# Patient Record
Sex: Female | Born: 1985
Health system: Southern US, Community
[De-identification: ages and names within clinical notes are randomized; demographics above are authoritative.]

## PROBLEM LIST (undated history)

## (undated) DIAGNOSIS — R079 Chest pain, unspecified: Secondary | ICD-10-CM

## (undated) DIAGNOSIS — I10 Essential (primary) hypertension: Secondary | ICD-10-CM

## (undated) DIAGNOSIS — R7303 Prediabetes: Secondary | ICD-10-CM

## (undated) DIAGNOSIS — Z9889 Other specified postprocedural states: Secondary | ICD-10-CM

## (undated) DIAGNOSIS — J45909 Unspecified asthma, uncomplicated: Secondary | ICD-10-CM

## (undated) DIAGNOSIS — T7840XA Allergy, unspecified, initial encounter: Secondary | ICD-10-CM

## (undated) DIAGNOSIS — R112 Nausea with vomiting, unspecified: Secondary | ICD-10-CM

## (undated) DIAGNOSIS — E042 Nontoxic multinodular goiter: Secondary | ICD-10-CM

## (undated) HISTORY — PX: TONSILLECTOMY AND ADENOIDECTOMY: SUR1326

## (undated) HISTORY — DX: Chest pain, unspecified: R07.9

## (undated) HISTORY — DX: Unspecified asthma, uncomplicated: J45.909

## (undated) HISTORY — PX: DILATION AND CURETTAGE OF UTERUS: SHX78

## (undated) HISTORY — DX: Essential (primary) hypertension: I10

## (undated) HISTORY — DX: Allergy, unspecified, initial encounter: T78.40XA

---

## 2012-10-12 ENCOUNTER — Encounter: Payer: Self-pay | Admitting: Obstetrics

## 2012-10-12 ENCOUNTER — Ambulatory Visit (INDEPENDENT_AMBULATORY_CARE_PROVIDER_SITE_OTHER): Payer: Medicaid Other | Admitting: Obstetrics

## 2012-10-12 VITALS — BP 153/98 | HR 81 | Temp 98.6°F | Ht 65.0 in | Wt 380.0 lb

## 2012-10-12 DIAGNOSIS — N949 Unspecified condition associated with female genital organs and menstrual cycle: Secondary | ICD-10-CM

## 2012-10-12 DIAGNOSIS — N760 Acute vaginitis: Secondary | ICD-10-CM

## 2012-10-12 DIAGNOSIS — Z0000 Encounter for general adult medical examination without abnormal findings: Secondary | ICD-10-CM

## 2012-10-12 DIAGNOSIS — Z01419 Encounter for gynecological examination (general) (routine) without abnormal findings: Secondary | ICD-10-CM

## 2012-10-12 NOTE — Progress Notes (Signed)
Subjective:     Monica Cantu is a 27 y.o. female here for her annual exam.  Current complaints: Patient states that she had some pelvic pain and brownish discharge with an odor for about 4 days.  She noticed yesterday that she had a tampon left inside.  She would just like to make sure she does not have an infection.   Personal health questionnaire reviewed: yes.   Gynecologic History Patient's last menstrual period was 09/28/2012. Contraception: none Last Pap: 2013. Results were: normal  Last mammogram: N/A  Obstetric History OB History   Grav Para Term Preterm Abortions TAB SAB Ect Mult Living                   The following portions of the patient's history were reviewed and updated as appropriate: allergies, current medications, past family history, past medical history, past social history, past surgical history and problem list.  Review of Systems Pertinent items are noted in HPI.    Objective:    General appearance: alert, no distress and morbidly obese Breasts: normal appearance, no masses or tenderness Abdomen: normal findings: soft, non-tender Pelvic: cervix normal in appearance, external genitalia normal, no adnexal masses or tenderness, no cervical motion tenderness, uterus normal size, shape, and consistency and vagina normal without discharge    Assessment:    Healthy female exam.   Pelvic pain.   Plan:    Education reviewed: Pelvic pain. Follow up in: 2 weeks. Ultrasound ordered.

## 2012-10-13 LAB — WET PREP BY MOLECULAR PROBE
Candida species: NEGATIVE
Gardnerella vaginalis: POSITIVE — AB
Trichomonas vaginosis: NEGATIVE

## 2012-10-13 LAB — PAP IG W/ RFLX HPV ASCU

## 2012-10-19 ENCOUNTER — Other Ambulatory Visit: Payer: Self-pay | Admitting: *Deleted

## 2012-10-19 DIAGNOSIS — B9689 Other specified bacterial agents as the cause of diseases classified elsewhere: Secondary | ICD-10-CM

## 2012-10-19 MED ORDER — METRONIDAZOLE 500 MG PO TABS: 500.0000 mg | ORAL_TABLET | Freq: Two times a day (BID) | ORAL | Status: DC

## 2012-10-26 ENCOUNTER — Ambulatory Visit: Payer: Medicaid Other | Admitting: Obstetrics

## 2013-04-25 ENCOUNTER — Emergency Department (HOSPITAL_BASED_OUTPATIENT_CLINIC_OR_DEPARTMENT_OTHER)
Admission: EM | Admit: 2013-04-25 | Discharge: 2013-04-25 | Disposition: A | Discharge status: Home / Self Care | Payer: Medicaid Other | Attending: Emergency Medicine | Admitting: Emergency Medicine

## 2013-04-25 ENCOUNTER — Emergency Department (HOSPITAL_BASED_OUTPATIENT_CLINIC_OR_DEPARTMENT_OTHER): Payer: Medicaid Other

## 2013-04-25 ENCOUNTER — Encounter (HOSPITAL_BASED_OUTPATIENT_CLINIC_OR_DEPARTMENT_OTHER): Payer: Self-pay | Admitting: Emergency Medicine

## 2013-04-25 DIAGNOSIS — R0789 Other chest pain: Secondary | ICD-10-CM | POA: Insufficient documentation

## 2013-04-25 DIAGNOSIS — R079 Chest pain, unspecified: Secondary | ICD-10-CM

## 2013-04-25 DIAGNOSIS — J45909 Unspecified asthma, uncomplicated: Secondary | ICD-10-CM | POA: Insufficient documentation

## 2013-04-25 DIAGNOSIS — Z79899 Other long term (current) drug therapy: Secondary | ICD-10-CM | POA: Insufficient documentation

## 2013-04-25 LAB — CBC
HCT: 38.1 % (ref 36.0–46.0)
Hemoglobin: 12.4 g/dL (ref 12.0–15.0)
MCH: 27.3 pg (ref 26.0–34.0)
MCHC: 32.5 g/dL (ref 30.0–36.0)
MCV: 83.7 fL (ref 78.0–100.0)
PLATELETS: 186 10*3/uL (ref 150–400)
RBC: 4.55 MIL/uL (ref 3.87–5.11)
RDW: 15.6 % — ABNORMAL HIGH (ref 11.5–15.5)
WBC: 8.5 10*3/uL (ref 4.0–10.5)

## 2013-04-25 LAB — BASIC METABOLIC PANEL
BUN: 11 mg/dL (ref 6–23)
CALCIUM: 9.4 mg/dL (ref 8.4–10.5)
CO2: 23 mEq/L (ref 19–32)
CREATININE: 0.8 mg/dL (ref 0.50–1.10)
Chloride: 103 mEq/L (ref 96–112)
GFR calc Af Amer: >90 mL/min (ref >90)
GLUCOSE: 97 mg/dL (ref 70–99)
Potassium: 4 mEq/L (ref 3.7–5.3)
Sodium: 140 mEq/L (ref 137–147)

## 2013-04-25 LAB — TROPONIN I: Troponin I: <0.3 ng/mL (ref <0.30)

## 2013-04-25 MED ORDER — HYDROCODONE-ACETAMINOPHEN 5-325 MG PO TABS
1.0000 | ORAL_TABLET | Freq: Once | ORAL | Status: AC
  Administered 2013-04-25: 1 via ORAL
  Filled 2013-04-25: qty 1

## 2013-04-25 MED ORDER — HYDROCODONE-ACETAMINOPHEN 5-325 MG PO TABS: 1.0000 | ORAL_TABLET | ORAL | Status: DC | PRN

## 2013-04-25 NOTE — ED Notes (Signed)
Pt returned from radiology.

## 2013-04-25 NOTE — ED Provider Notes (Signed)
CSN: [id number]     Arrival date & time 04/25/13  0935 History   First MD Initiated Contact with Patient 04/25/13 [phone]     Chief Complaint  Patient presents with  . Chest Pain   (Consider location/radiation/quality/duration/timing/severity/associated sxs/prior Treatment) HPI Comments: Pt is c/o pain on the right chest that started yesterday. Pain is intermittent:worse with laying back;tried her inhaler without relief. No fever, cough, injury or sob. No n/v or sob:pt states that pain goes from the right chest to the center or her chest. No history of similar symptoms  The history is provided by the patient. No language interpreter was used.    Past Medical History  Diagnosis Date  . Allergy   . Asthma    Past Surgical History  Procedure Laterality Date  . D 7 c    . Tonsillectomy and adenoidectomy     Family History  Problem Relation Age of Onset  . Hyperlipidemia Father   . Diabetes Maternal Grandmother   . Heart disease Paternal Grandmother   . Hypertension Paternal Grandmother    History  Substance Use Topics  . Smoking status: Never Smoker   . Smokeless tobacco: Not on file  . Alcohol Use: No   OB History   Grav Para Term Preterm Abortions TAB SAB Ect Mult Living                 Review of Systems  Constitutional: Negative.   Respiratory: Negative for shortness of breath.   Cardiovascular: Positive for chest pain.    Allergies  Review of patient's allergies indicates no known allergies.  Home Medications   Current Outpatient Rx  Name  Route  Sig  Dispense  Refill  . albuterol (PROVENTIL) (2.5 MG/3ML) 0.083% nebulizer solution   Nebulization   Take 2.5 mg by nebulization every 6 (six) hours as needed for wheezing.         . cetirizine (ZYRTEC) 10 MG tablet   Oral   Take 10 mg by mouth daily.         . metroNIDAZOLE (FLAGYL) 500 MG tablet   Oral   Take 1 tablet (500 mg total) by mouth 2 (two) times daily.   14 tablet   0    BP 165/100  Pulse  88  Temp(Src) 98.7 F (37.1 C) (Oral)  Resp 20  Ht 5\' 5"  (1.651 m)  Wt 411 lb (186.428 kg)  BMI 68.39 kg/m2  SpO2 100%  LMP 04/14/2013 Physical Exam  Vitals reviewed. Constitutional: She is oriented to person, place, and time. She appears well-developed and well-nourished.  HENT:  Head: Normocephalic and atraumatic.  Cardiovascular: Normal rate and regular rhythm.   Pulmonary/Chest: Effort normal and breath sounds normal. She exhibits tenderness.  Abdominal: Soft. There is no tenderness.  Musculoskeletal: Normal range of motion.  Neurological: She is alert and oriented to person, place, and time. Coordination normal.  Skin: Skin is warm and dry.    ED Course  Procedures (including critical care time) Labs Review Labs Reviewed  CBC - Abnormal; Notable for the following:    RDW 15.6 (*)    All other components within normal limits  BASIC METABOLIC PANEL  TROPONIN I   Imaging Review Dg Chest 2 View  04/25/2013   CLINICAL DATA:  Chest pain  EXAM: CHEST  2 VIEW  COMPARISON:  None.  FINDINGS: The heart size and mediastinal contours are within normal limits. There is no focal infiltrate, pulmonary edema, or pleural effusion. The visualized skeletal  structures are unremarkable.  IMPRESSION: No active cardiopulmonary disease.   Electronically Signed   By: Sherian Rein M.D.   On: 04/25/2013 10:57    EKG Interpretation    Date/Time:  Monday April 25 2013 09:53:16 EST Ventricular Rate:  73 PR Interval:  154 QRS Duration: 96 QT Interval:  372 QTC Calculation: 409 R Axis:   62 Text Interpretation:  Normal sinus rhythm Normal ECG No old tracing to compare Confirmed by JACUBOWITZ  MD, SAM ([id number]) on 04/25/2013 9:56:52 AM            MDM   1. Chest pain    Pt pain is better after the hydrocodone:considered pe although she is not hypoxic or tachycardic:pt appears pleuritic in nature:pt to follow up with her pcp for continued symptoms   Teressa Lower, NP 04/25/13  1145

## 2013-04-25 NOTE — ED Notes (Signed)
NP at bedside.

## 2013-04-25 NOTE — ED Notes (Signed)
Pt reports she developed right chest pain last night with radiation to central chest.  She also reports low back pain.

## 2013-04-25 NOTE — Discharge Instructions (Signed)
Chest Pain (Nonspecific) °It is often hard to give a specific diagnosis for the cause of chest pain. There is always a chance that your pain could be related to something serious, such as a heart attack or a blood clot in the lungs. You need to follow up with your caregiver for further evaluation. °CAUSES  °· Heartburn. °· Pneumonia or bronchitis. °· Anxiety or stress. °· Inflammation around your heart (pericarditis) or lung (pleuritis or pleurisy). °· A blood clot in the lung. °· A collapsed lung (pneumothorax). It can develop suddenly on its own (spontaneous pneumothorax) or from injury (trauma) to the chest. °· Shingles infection (herpes zoster virus). °The chest wall is composed of bones, muscles, and cartilage. Any of these can be the source of the pain. °· The bones can be bruised by injury. °· The muscles or cartilage can be strained by coughing or overwork. °· The cartilage can be affected by inflammation and become sore (costochondritis). °DIAGNOSIS  °Lab tests or other studies, such as X-rays, electrocardiography, stress testing, or cardiac imaging, may be needed to find the cause of your pain.  °TREATMENT  °· Treatment depends on what may be causing your chest pain. Treatment may include: °· Acid blockers for heartburn. °· Anti-inflammatory medicine. °· Pain medicine for inflammatory conditions. °· Antibiotics if an infection is present. °· You may be advised to change lifestyle habits. This includes stopping smoking and avoiding alcohol, caffeine, and chocolate. °· You may be advised to keep your head raised (elevated) when sleeping. This reduces the chance of acid going backward from your stomach into your esophagus. °· Most of the time, nonspecific chest pain will improve within 2 to 3 days with rest and mild pain medicine. °HOME CARE INSTRUCTIONS  °· If antibiotics were prescribed, take your antibiotics as directed. Finish them even if you start to feel better. °· For the next few days, avoid physical  activities that bring on chest pain. Continue physical activities as directed. °· Do not smoke. °· Avoid drinking alcohol. °· Only take over-the-counter or prescription medicine for pain, discomfort, or fever as directed by your caregiver. °· Follow your caregiver's suggestions for further testing if your chest pain does not go away. °· Keep any follow-up appointments you made. If you do not go to an appointment, you could develop lasting (chronic) problems with pain. If there is any problem keeping an appointment, you must call to reschedule. °SEEK MEDICAL CARE IF:  °· You think you are having problems from the medicine you are taking. Read your medicine instructions carefully. °· Your chest pain does not go away, even after treatment. °· You develop a rash with blisters on your chest. °SEEK IMMEDIATE MEDICAL CARE IF:  °· You have increased chest pain or pain that spreads to your arm, neck, jaw, back, or abdomen. °· You develop shortness of breath, an increasing cough, or you are coughing up blood. °· You have severe back or abdominal pain, feel nauseous, or vomit. °· You develop severe weakness, fainting, or chills. °· You have a fever. °THIS IS AN EMERGENCY. Do not wait to see if the pain will go away. Get medical help at once. Call your local emergency services (911 in [place] Do not drive yourself to the hospital. °MAKE SURE YOU:  °· Understand these instructions. °· Will watch your condition. °· Will get help right away if you are not doing well or get worse. °Document Released: 01/01/2005 Document Revised: 06/16/2011 Document Reviewed: 10/28/2007 °[employer]® Patient Information ©2014 [employer]   [employer]. ° °

## 2013-04-25 NOTE — ED Provider Notes (Signed)
Medical screening examination/treatment/procedure(s) were performed by non-physician practitioner and as supervising physician I was immediately available for consultation/collaboration.  EKG Interpretation    Date/Time:  Monday April 25 2013 09:53:16 EST Ventricular Rate:  73 PR Interval:  154 QRS Duration: 96 QT Interval:  372 QTC Calculation: 409 R Axis:   62 Text Interpretation:  Normal sinus rhythm Normal ECG No old tracing to compare Confirmed by Ethelda ChickJACUBOWITZ  MD, Zariana Strub ([id number]) on 04/25/2013 9:56:52 AM             Doug SouSam Lashundra Shiveley, MD 04/25/13 1414

## 2013-06-16 ENCOUNTER — Encounter: Payer: Self-pay | Admitting: Obstetrics

## 2013-06-16 ENCOUNTER — Ambulatory Visit (INDEPENDENT_AMBULATORY_CARE_PROVIDER_SITE_OTHER): Payer: Medicaid Other | Admitting: Obstetrics

## 2013-06-16 VITALS — BP 153/83 | HR 87 | Temp 98.6°F | Ht 64.0 in | Wt >= 6400 oz

## 2013-06-16 DIAGNOSIS — N926 Irregular menstruation, unspecified: Secondary | ICD-10-CM

## 2013-06-16 DIAGNOSIS — N925 Other specified irregular menstruation: Secondary | ICD-10-CM

## 2013-06-16 DIAGNOSIS — N938 Other specified abnormal uterine and vaginal bleeding: Secondary | ICD-10-CM

## 2013-06-16 DIAGNOSIS — N949 Unspecified condition associated with female genital organs and menstrual cycle: Secondary | ICD-10-CM

## 2013-06-16 LAB — POCT URINE PREGNANCY: Preg Test, Ur: NEGATIVE

## 2013-06-16 MED ORDER — IBUPROFEN 800 MG PO TABS: 800.0000 mg | ORAL_TABLET | Freq: Three times a day (TID) | ORAL | Status: DC | PRN

## 2013-06-16 NOTE — Progress Notes (Signed)
Subjective:     Monica Cantu is a 28 y.o. female here for a routine exam.  Current complaints: Patient is in the office today for a Problem visit. Patient states she is having pain in her pelvic are and on her left side. Patient states she has been nauseous for the last 2 weeks. Patient states she is having lower back pain. Patient states yesterday she had some light pink spotting but nothing since. Patient states she is late for her cycle. Patient states she has taken 3 pregnancy test 2 were negative and 1 was positive. Patient states she has been have bad headaches for a week or so.  Personal health questionnaire reviewed: yes.   Gynecologic History Patient's last menstrual period was 05/09/2013. Contraception: condoms Last Pap: 10/12/2012. Results were: normal  Obstetric History OB History  Gravida Para Term Preterm AB SAB TAB Ectopic Multiple Living  4 2 1 1 2 2    2     # Outcome Date GA Lbr Len/2nd Weight Sex Delivery Anes PTL Lv  4 TRM 08/05/10 [id number]  9 lb 1 oz (4.111 kg) F SVD EPI  Y  3 SAB 2011        N  2 SAB 2008        N  1 PRE 06/22/04 [id number]  7 lb 11 oz (3.487 kg) M SVD EPI  Y       The following portions of the patient's history were reviewed and updated as appropriate: allergies, current medications, past family history, past medical history, past social history, past surgical history and problem list.  Review of Systems Pertinent items are noted in HPI.    Objective:    General appearance: alert and no distress Abdomen: abnormal findings:  mild tenderness in the LUQ and in the LLQ Pelvic: cervix normal in appearance, external genitalia normal, no adnexal masses or tenderness, no cervical motion tenderness, rectovaginal septum normal, uterus normal size, shape, and consistency, vagina normal without discharge and menstrual blood in vault and from cervix    Assessment:    Pelvic pain.  Late period.  ( H/O ectopic and SAB, so she is fearful of reccurrence )   HTN Plan:    Education reviewed: Pelvic pain.  . Contraception: condoms. Follow up in: 2 weeks. Ultrasound ordered.

## 2013-06-16 NOTE — Addendum Note (Signed)
Addended by: Odessa FlemingBOHNE, Shaquaya Wuellner M on: 06/16/2013 03:19 PM   Modules accepted: Orders

## 2013-06-17 LAB — GC/CHLAMYDIA PROBE AMP
CT Probe RNA: NEGATIVE
GC PROBE AMP APTIMA: NEGATIVE

## 2013-06-17 LAB — WET PREP BY MOLECULAR PROBE
Candida species: NEGATIVE
Gardnerella vaginalis: NEGATIVE
Trichomonas vaginosis: NEGATIVE

## 2013-07-01 ENCOUNTER — Ambulatory Visit (HOSPITAL_COMMUNITY): Payer: Medicaid Other

## 2013-07-05 ENCOUNTER — Ambulatory Visit: Payer: Medicaid Other | Admitting: Obstetrics

## 2013-07-07 ENCOUNTER — Ambulatory Visit (HOSPITAL_COMMUNITY): Admission: RE | Admit: 2013-07-07 | Payer: Medicaid Other | Source: Ambulatory Visit

## 2013-07-18 ENCOUNTER — Ambulatory Visit: Payer: Medicaid Other | Admitting: Obstetrics

## 2014-02-06 ENCOUNTER — Encounter: Payer: Self-pay | Admitting: Obstetrics

## 2015-01-30 ENCOUNTER — Emergency Department (HOSPITAL_BASED_OUTPATIENT_CLINIC_OR_DEPARTMENT_OTHER): Payer: Managed Care, Other (non HMO)

## 2015-01-30 ENCOUNTER — Emergency Department (HOSPITAL_BASED_OUTPATIENT_CLINIC_OR_DEPARTMENT_OTHER)
Admission: EM | Admit: 2015-01-30 | Discharge: 2015-01-30 | Disposition: A | Discharge status: Home / Self Care | Payer: Managed Care, Other (non HMO) | Attending: Emergency Medicine | Admitting: Emergency Medicine

## 2015-01-30 ENCOUNTER — Telehealth: Payer: Self-pay | Admitting: Internal Medicine

## 2015-01-30 ENCOUNTER — Encounter (HOSPITAL_BASED_OUTPATIENT_CLINIC_OR_DEPARTMENT_OTHER): Payer: Self-pay | Admitting: *Deleted

## 2015-01-30 DIAGNOSIS — Z79899 Other long term (current) drug therapy: Secondary | ICD-10-CM | POA: Insufficient documentation

## 2015-01-30 DIAGNOSIS — J45901 Unspecified asthma with (acute) exacerbation: Secondary | ICD-10-CM | POA: Insufficient documentation

## 2015-01-30 DIAGNOSIS — R079 Chest pain, unspecified: Secondary | ICD-10-CM | POA: Diagnosis present

## 2015-01-30 MED ORDER — PREDNISONE 20 MG PO TABS: 40.0000 mg | ORAL_TABLET | Freq: Once | ORAL | Status: DC

## 2015-01-30 MED ORDER — PREDNISONE 20 MG PO TABS
40.0000 mg | ORAL_TABLET | Freq: Once | ORAL | Status: AC
  Administered 2015-01-30: 40 mg via ORAL
  Filled 2015-01-30: qty 2

## 2015-01-30 MED ORDER — ALBUTEROL SULFATE HFA 108 (90 BASE) MCG/ACT IN AERS: 1.0000 | INHALATION_SPRAY | RESPIRATORY_TRACT | Status: DC | PRN

## 2015-01-30 MED ORDER — KETOROLAC TROMETHAMINE 60 MG/2ML IM SOLN
60.0000 mg | Freq: Once | INTRAMUSCULAR | Status: AC
  Administered 2015-01-30: 60 mg via INTRAMUSCULAR
  Filled 2015-01-30: qty 2

## 2015-01-30 MED ORDER — IPRATROPIUM-ALBUTEROL 0.5-2.5 (3) MG/3ML IN SOLN
3.0000 mL | Freq: Once | RESPIRATORY_TRACT | Status: AC
  Administered 2015-01-30: 3 mL via RESPIRATORY_TRACT
  Filled 2015-01-30: qty 3

## 2015-01-30 NOTE — ED Provider Notes (Signed)
CSN: [id number]     Arrival date & time 01/30/15  2044 History  By signing my name below, I, Gwenyth Ober, attest that this documentation has been prepared under the direction and in the presence of Marily Memos, MD.  Electronically Signed: Gwenyth Ober, ED Scribe. 01/30/2015. 9:34 PM.   Chief Complaint  Patient presents with  . Chest Pain   The history is provided by the patient. No language interpreter was used.   HPI Comments: Monica Cantu is a 29 y.o. female with a history of asthma who presents to the Emergency Department complaining of intermittent, moderate chest pressure that started yesterday and became more constant today. She states congestion, cough and SOB that started a few days ago as associated symptoms. Pt has tried using her inhaler with no relief. Pt has a history of similar symptoms associated with asthma exacerbations. She denies a history of CAD, recent surgeries or long travel. Pt also denies rash, leg swelling, nausea, vomiting and diaphoresis as associated symptoms.  Past Medical History  Diagnosis Date  . Allergy   . Asthma    Past Surgical History  Procedure Laterality Date  . D 7 c    . Tonsillectomy and adenoidectomy     Family History  Problem Relation Age of Onset  . Hyperlipidemia Father   . Diabetes Maternal Grandmother   . Heart disease Paternal Grandmother   . Hypertension Paternal Grandmother    Social History  Substance Use Topics  . Smoking status: Never Smoker   . Smokeless tobacco: Never Used  . Alcohol Use: No   OB History    Gravida Para Term Preterm AB TAB SAB Ectopic Multiple Living   Review of Systems  Constitutional: Negative for diaphoresis.  HENT: Positive for congestion.   Respiratory: Positive for cough and shortness of breath.   Cardiovascular: Positive for chest pain. Negative for leg swelling.  Gastrointestinal: Negative for nausea and vomiting.  Skin: Negative for rash.  All other systems  reviewed and are negative.  Allergies  Review of patient's allergies indicates no known allergies.  Home Medications   Prior to Admission medications   Medication Sig Start Date End Date Taking? Authorizing Provider  albuterol (PROVENTIL HFA;VENTOLIN HFA) 108 (90 BASE) MCG/ACT inhaler Inhale 1-2 puffs into the lungs every 4 (four) hours as needed for wheezing or shortness of breath. 01/30/15   Marily Memos, MD  Beclomethasone Dipropionate (QVAR IN) Inhale into the lungs.    Historical Provider, MD  cetirizine (ZYRTEC) 10 MG tablet Take 10 mg by mouth daily.    Historical Provider, MD  ibuprofen (ADVIL,MOTRIN) 800 MG tablet Take 1 tablet (800 mg total) by mouth every 8 (eight) hours as needed. 06/16/13   Brock Bad, MD  predniSONE (DELTASONE) 20 MG tablet Take 2 tablets (40 mg total) by mouth once. 01/31/15   Barbara Cower Rachelle Edwards, MD   BP 134/78 mmHg  Pulse 98  Temp(Src) 99 F (37.2 C) (Oral)  Resp 20  Ht  (1.626 m)  Wt 418 lb (189.604 kg)  BMI 71.71 kg/m2  SpO2 100%  LMP 01/08/2015 Physical Exam  Constitutional: She is oriented to person, place, and time. She appears well-developed and well-nourished. No distress.  HENT:  Head: Normocephalic and atraumatic.  Eyes: Conjunctivae and EOM are normal. Pupils are equal, round, and reactive to light.  Neck: Normal range of motion. Neck supple. No tracheal deviation present.  Cardiovascular: Normal  rate, regular rhythm and normal heart sounds.   Pulmonary/Chest: Effort normal. No respiratory distress. She has wheezes.  Lungs mostly clear with mild expiratory wheeze  Abdominal: Soft. There is no tenderness.  Musculoskeletal: She exhibits no edema.  Neurological: She is alert and oriented to person, place, and time.  Skin: Skin is warm and dry.  Psychiatric: She has a normal mood and affect. Her behavior is normal.  Nursing note and vitals reviewed.   ED Course  Procedures  DIAGNOSTIC STUDIES: Oxygen Saturation is 100% on RA,  normal by my interpretation.    COORDINATION OF CARE: 9:32 PM Discussed treatment plan with pt at bedside and pt agreed to plan.  Labs Review Labs Reviewed - No data to display  Imaging Review Dg Chest 2 View  01/30/2015  CLINICAL DATA:  Intermittent left chest pressure. EXAM: CHEST  2 VIEW COMPARISON:  04/25/2013 FINDINGS: The heart size and mediastinal contours are within normal limits. Both lungs are clear. The visualized skeletal structures are unremarkable. IMPRESSION: No active cardiopulmonary disease. Electronically Signed   By: Richarda OverlieAdam  Henn M.D.   On: 01/30/2015 21:50      EKG Interpretation   Date/Time:  Tuesday January 30 2015 20:51:41 EDT Ventricular Rate:  89 PR Interval:  170 QRS Duration: 94 QT Interval:  346 QTC Calculation: 420 R Axis:   60 Text Interpretation:  Normal sinus rhythm Normal ECG Confirmed by Magie Ciampa  MD, Barbara CowerJASON [id number]) on 01/30/2015 9:32:08 PM      MDM   Final diagnoses:  Asthma exacerbation   Has had progressively worsening cough for the last few days with progressively worsening right-sided sharp chest pain as well. She also has some pain in her back. Worse when she takes deep breaths and worse and she coughs. This is similar to multiple previous episodes of asthma exacerbations. No fever no productive cough no leg swelling, recent travels, arm swelling or history of clots. Exam benign with some mild end expiratory wheezing after DuoNeb she received prior to my evaluation. PERC negative, doubt PE. Chest x-ray without evidence of rib fracture, pneumonia or pneumothorax. Symptoms seem to be most consistent with a muscular pain secondary to coughing from an asthma exacerbation and I will treat accordingly.  I have personally and contemperaneously reviewed labs and imaging and used in my decision making as above.   A medical screening exam was performed and I feel the patient has had an appropriate workup for their chief complaint at this time and  likelihood of emergent condition existing is low. They have been counseled on decision, discharge, follow up and which symptoms necessitate immediate return to the emergency department. They or their family verbally stated understanding and agreement with plan and discharged in stable condition.    I personally performed the services described in this documentation, which was scribed in my presence. The recorded information has been reviewed and is accurate.    Marily MemosJason Nellie Chevalier, MD 01/30/15 [phone]

## 2015-01-30 NOTE — Telephone Encounter (Signed)
FYI

## 2015-01-30 NOTE — ED Notes (Signed)
Patient verbalizes understanding of discharge instructions and medications.  Patient stable and ambulatory. 

## 2015-01-30 NOTE — Telephone Encounter (Signed)
Please advise 

## 2015-01-30 NOTE — Telephone Encounter (Signed)
That is okay, please arrange. Please be sure the patient knows that she will continue need to see her gynecologist

## 2015-01-30 NOTE — ED Notes (Signed)
Chest pressure in the center of her chest since this am. Cold with cough for a couple of days. She feels sob. Hx of asthma and has been using her inhalers all weekend.

## 2015-01-30 NOTE — Telephone Encounter (Signed)
Ms. Monica Cantu is requesting to see Dr. Drue NovelPaz. She states she now has [employer] insurance and was referred by existing pt [name]. Please advise.

## 2015-01-31 NOTE — Telephone Encounter (Signed)
Left msg for pt to call and schedule new pt and to continue with her gyn.

## 2015-04-17 ENCOUNTER — Ambulatory Visit: Payer: Self-pay | Admitting: Certified Nurse Midwife

## 2015-04-24 ENCOUNTER — Encounter: Payer: Self-pay | Admitting: Certified Nurse Midwife

## 2015-04-24 ENCOUNTER — Ambulatory Visit (INDEPENDENT_AMBULATORY_CARE_PROVIDER_SITE_OTHER): Payer: BLUE CROSS/BLUE SHIELD | Admitting: Certified Nurse Midwife

## 2015-04-24 VITALS — BP 152/103 | HR 79 | Temp 98.3°F | Wt 378.0 lb

## 2015-04-24 DIAGNOSIS — R1084 Generalized abdominal pain: Secondary | ICD-10-CM

## 2015-04-24 DIAGNOSIS — Z01419 Encounter for gynecological examination (general) (routine) without abnormal findings: Secondary | ICD-10-CM

## 2015-04-24 DIAGNOSIS — N939 Abnormal uterine and vaginal bleeding, unspecified: Secondary | ICD-10-CM

## 2015-04-24 DIAGNOSIS — R030 Elevated blood-pressure reading, without diagnosis of hypertension: Secondary | ICD-10-CM

## 2015-04-24 DIAGNOSIS — M5440 Lumbago with sciatica, unspecified side: Secondary | ICD-10-CM

## 2015-04-24 DIAGNOSIS — IMO0001 Reserved for inherently not codable concepts without codable children: Secondary | ICD-10-CM | POA: Insufficient documentation

## 2015-04-24 MED ORDER — AMLODIPINE-VALSARTAN-HCTZ 10-160-25 MG PO TABS: 1.0000 | ORAL_TABLET | Freq: Every morning | ORAL | Status: DC

## 2015-04-24 MED ORDER — IBUPROFEN 800 MG PO TABS: 800.0000 mg | ORAL_TABLET | Freq: Three times a day (TID) | ORAL | Status: DC | PRN

## 2015-04-24 NOTE — Progress Notes (Signed)
Patient ID: Monica Cantu, female   DOB: Nov 20, 1985, 30 y.o.   MRN: [id number]    Subjective:      Monica Cantu is a 30 y.o. female here for a routine exam.  Current complaints: heavy and painful periods.  Regular periods, lasting 3 days, with first two days are heavy with clots about half dollar size.  Hx of D&C.  Does not desire to be pregnant currently.  Not currently sexually active, divorced.   Works full time.  Has low back pain, usually on the left side with sciatic pains on the left side, has not tried anything for the pain.  Has had the pain for years, since before her children.  Has been trying to loose weight, recently tried bHCG hormonal treatment, is interested in gastric bypass surgery.    Does report lower abdominal pain that comes and goes on the right side, denies any diarrhea and reports constipation.     Personal health questionnaire:  Is patient [name], have a family history of breast and/or ovarian cancer: no Is there a family history of uterine cancer diagnosed at age < 9, gastrointestinal cancer, urinary tract cancer, family member who is a Personnel officer syndrome-associated carrier: no Is the patient overweight and hypertensive, family history of diabetes, personal history of gestational diabetes, preeclampsia or PCOS: yes Is patient over 7, have PCOS,  family history of premature CHD under age 29, diabetes, smoke, have hypertension or peripheral artery disease:  yes At any time, has a partner hit, kicked or otherwise hurt or frightened you?: no Over the past 2 weeks, have you felt down, depressed or hopeless?: no Over the past 2 weeks, have you felt little interest or pleasure in doing things?:no   Gynecologic History Patient's last menstrual period was 04/02/2015. Contraception: abstinence Last Pap: 10/2012. Results were: normal Last mammogram: N/A.   Obstetric History OB History  Gravida Para Term Preterm AB SAB TAB Ectopic Multiple Living  # Outcome Date GA Lbr Len/2nd Weight Sex Delivery Anes PTL Lv  5 Term 08/05/10 [redacted]w[redacted]d  9 lb 1 oz (4.111 kg) F Vag-Spont EPI  Y  4 SAB 2011        N  3 SAB 2008        N  2 Preterm 06/22/04 [redacted]w[redacted]d  7 lb 11 oz (3.487 kg) M Vag-Spont EPI  Y  1 Ectopic 2004              Past Medical History  Diagnosis Date  . Allergy   . Asthma     Past Surgical History  Procedure Laterality Date  . D 7 c    . Tonsillectomy and adenoidectomy       Current outpatient prescriptions:  .  albuterol (PROVENTIL HFA;VENTOLIN HFA) 108 (90 BASE) MCG/ACT inhaler, Inhale 1-2 puffs into the lungs every 4 (four) hours as needed for wheezing or shortness of breath., Disp: 1 Inhaler, Rfl: 0 .  cetirizine (ZYRTEC) 10 MG tablet, Take 10 mg by mouth daily., Disp: , Rfl:  .  Beclomethasone Dipropionate (QVAR IN), Inhale into the lungs. Reported on 04/24/2015, Disp: , Rfl:  No Known Allergies  Social History  Substance Use Topics  . Smoking status: Never Smoker   . Smokeless tobacco: Never Used  . Alcohol Use: No    Family History  Problem Relation Age of Onset  . Hyperlipidemia Father   . Diabetes Maternal Grandmother   .  Heart disease Paternal Grandmother   . Hypertension Paternal Grandmother       Review of Systems  Constitutional: negative for fatigue and weight loss, +obesity Respiratory: negative for cough and wheezing Cardiovascular: negative for chest pain, fatigue and palpitations Gastrointestinal: + for abdominal pain and change in bowel habits Musculoskeletal:+ for myalgias Neurological: negative for gait problems and tremors Behavioral/Psych: negative for abusive relationship, depression Endocrine: negative for temperature intolerance   Genitourinary:negative for genital lesions, hot flashes, sexual problems and vaginal discharge, + abnormal menstrual periods Integument/breast: negative for breast lump, breast tenderness, nipple discharge and skin lesion(s)    Objective:       BP  152/103 mmHg  Pulse 79  Temp(Src) 98.3 F (36.8 C)  Wt 378 lb (171.46 kg)  LMP 04/02/2015 General:   alert  Skin:   no rash or abnormalities  Lungs:   clear to auscultation bilaterally  Heart:   regular rate and rhythm, S1, S2 normal, no murmur, click, rub or gallop  Breasts:   normal without suspicious masses, skin or nipple changes or axillary nodes  Abdomen:  normal findings: no organomegaly, soft, non-tender and no hernia obese  Pelvis:  External genitalia: normal general appearance Urinary system: urethral meatus normal and bladder without fullness, nontender Vaginal: normal without tenderness, induration or masses Cervix: normal appearance, friable on exam Adnexa: normal bimanual exam Uterus: anteverted and non-tender, normal size, difficult to assess d/t morbid obesity   Lab Review Urine pregnancy test Labs reviewed yes Radiologic studies reviewed no  60% of 45 min visit spent on counseling and coordination of care.   Assessment:    Healthy female exam.    Morbid obesity  Lumbar back pain with sciatica  Lower abdominal pain of unknown origin  Elevated blood pressure: started on meds today     Plan:    Education reviewed: calcium supplements, depression evaluation, low fat, low cholesterol diet, safe sex/STD prevention, self breast exams, skin cancer screening and weight bearing exercise. Contraception: abstinence. Follow up in: 1 month. after [place]  No orders of the defined types were placed in this encounter.   No orders of the defined types were placed in this encounter.    Possible management options include: LARK

## 2015-04-25 ENCOUNTER — Encounter: Payer: Self-pay | Admitting: Gastroenterology

## 2015-04-25 ENCOUNTER — Telehealth: Payer: Self-pay

## 2015-04-25 NOTE — Telephone Encounter (Signed)
CALLED PATIENT WITH U/S APPT AT [facility], GAVE HER NUMBER FOR [facility] AT [place], [phone], [facility] WILL CALL HER DIRECTLY - OTHER APPTS MADE

## 2015-04-26 LAB — PAP, TP IMAGING W/ HPV RNA, RFLX HPV TYPE 16,18/45: HPV mRNA, High Risk: NOT DETECTED

## 2015-04-28 LAB — SURESWAB BACTERIAL VAGINOSIS/ITIS
ATOPOBIUM VAGINAE: NOT DETECTED Log (cells/mL)
C. albicans, DNA: NOT DETECTED
C. glabrata, DNA: NOT DETECTED
C. parapsilosis, DNA: NOT DETECTED
C. tropicalis, DNA: NOT DETECTED
GARDNERELLA VAGINALIS: NOT DETECTED Log (cells/mL)
LACTOBACILLUS SPECIES: NOT DETECTED Log (cells/mL)
MEGASPHAERA SPECIES: NOT DETECTED Log (cells/mL)
T. vaginalis RNA, QL TMA: NOT DETECTED

## 2015-05-01 ENCOUNTER — Telehealth: Payer: Self-pay

## 2015-05-01 NOTE — Telephone Encounter (Signed)
PATIENT REFERRED TO  NEUROSURGERY, AND SHE TOLD THEM WHEN THEY CALLED FOR APPT., THAT SHE WOULD CALL THEM BACK FOR APPT AT A LATER DATE

## 2015-05-03 ENCOUNTER — Ambulatory Visit (HOSPITAL_COMMUNITY): Payer: BLUE CROSS/BLUE SHIELD

## 2015-05-10 ENCOUNTER — Telehealth: Payer: Self-pay | Admitting: Behavioral Health

## 2015-05-10 NOTE — Telephone Encounter (Signed)
Unable to reach patient at time of Pre-Visit Call.  Left message for patient to return call when available.    

## 2015-05-11 ENCOUNTER — Ambulatory Visit (INDEPENDENT_AMBULATORY_CARE_PROVIDER_SITE_OTHER): Payer: BLUE CROSS/BLUE SHIELD | Admitting: Internal Medicine

## 2015-05-11 ENCOUNTER — Encounter: Payer: Self-pay | Admitting: Internal Medicine

## 2015-05-11 ENCOUNTER — Other Ambulatory Visit: Payer: Self-pay | Admitting: Internal Medicine

## 2015-05-11 VITALS — BP 126/74 | HR 81 | Temp 98.6°F | Ht 65.0 in | Wt 375.0 lb

## 2015-05-11 DIAGNOSIS — Z7189 Other specified counseling: Secondary | ICD-10-CM | POA: Diagnosis not present

## 2015-05-11 DIAGNOSIS — R110 Nausea: Secondary | ICD-10-CM

## 2015-05-11 DIAGNOSIS — R109 Unspecified abdominal pain: Secondary | ICD-10-CM

## 2015-05-11 DIAGNOSIS — R030 Elevated blood-pressure reading, without diagnosis of hypertension: Secondary | ICD-10-CM | POA: Diagnosis not present

## 2015-05-11 DIAGNOSIS — Z7689 Persons encountering health services in other specified circumstances: Secondary | ICD-10-CM

## 2015-05-11 DIAGNOSIS — J4521 Mild intermittent asthma with (acute) exacerbation: Secondary | ICD-10-CM | POA: Diagnosis not present

## 2015-05-11 DIAGNOSIS — J45909 Unspecified asthma, uncomplicated: Secondary | ICD-10-CM | POA: Insufficient documentation

## 2015-05-11 DIAGNOSIS — IMO0001 Reserved for inherently not codable concepts without codable children: Secondary | ICD-10-CM

## 2015-05-11 LAB — CBC WITH DIFFERENTIAL/PLATELET
Basophils Absolute: 0.1 10*3/uL (ref 0.0–0.1)
Basophils Relative: 1 % (ref 0–1)
EOS PCT: 2 % (ref 0–5)
Eosinophils Absolute: 0.2 10*3/uL (ref 0.0–0.7)
HEMATOCRIT: 40.5 % (ref 36.0–46.0)
Hemoglobin: 13 g/dL (ref 12.0–15.0)
LYMPHS PCT: 34 % (ref 12–46)
Lymphs Abs: 2.7 10*3/uL (ref 0.7–4.0)
MCH: 28 pg (ref 26.0–34.0)
MCHC: 32.1 g/dL (ref 30.0–36.0)
MCV: 87.1 fL (ref 78.0–100.0)
MPV: 12 fL (ref 8.6–12.4)
Monocytes Absolute: 0.8 10*3/uL (ref 0.1–1.0)
Monocytes Relative: 10 % (ref 3–12)
Neutro Abs: 4.2 10*3/uL (ref 1.7–7.7)
Neutrophils Relative %: 53 % (ref 43–77)
Platelets: 219 10*3/uL (ref 150–400)
RBC: 4.65 MIL/uL (ref 3.87–5.11)
RDW: 15.2 % (ref 11.5–15.5)
WBC: 7.9 10*3/uL (ref 4.0–10.5)

## 2015-05-11 LAB — COMPREHENSIVE METABOLIC PANEL
ALBUMIN: 3.9 g/dL (ref 3.6–5.1)
ALK PHOS: 54 U/L (ref 33–115)
ALT: 10 U/L (ref 6–29)
AST: 14 U/L (ref 10–30)
BUN: 13 mg/dL (ref 7–25)
CHLORIDE: 105 mmol/L (ref 98–110)
CO2: 28 mmol/L (ref 20–31)
CREATININE: 0.94 mg/dL (ref 0.50–1.10)
Calcium: 9.8 mg/dL (ref 8.6–10.2)
Glucose, Bld: 80 mg/dL (ref 65–99)
Potassium: 4 mmol/L (ref 3.5–5.3)
SODIUM: 142 mmol/L (ref 135–146)
TOTAL PROTEIN: 7 g/dL (ref 6.1–8.1)
Total Bilirubin: 0.4 mg/dL (ref 0.2–1.2)

## 2015-05-11 LAB — URINALYSIS, ROUTINE W REFLEX MICROSCOPIC
Bilirubin Urine: NEGATIVE
GLUCOSE, UA: NEGATIVE
Hgb urine dipstick: NEGATIVE
NITRITE: NEGATIVE
Protein, ur: NEGATIVE
SPECIFIC GRAVITY, URINE: 1.031 (ref 1.001–1.035)
pH: 5.5 (ref 5.0–8.0)

## 2015-05-11 LAB — URINALYSIS, MICROSCOPIC ONLY
Casts: NONE SEEN [LPF]
Yeast: NONE SEEN [HPF]

## 2015-05-11 LAB — POCT URINE PREGNANCY: PREG TEST UR: NEGATIVE

## 2015-05-11 MED ORDER — ALBUTEROL SULFATE HFA 108 (90 BASE) MCG/ACT IN AERS: 1.0000 | INHALATION_SPRAY | RESPIRATORY_TRACT | Status: DC | PRN

## 2015-05-11 MED ORDER — BECLOMETHASONE DIPROPIONATE 80 MCG/ACT IN AERS: 2.0000 | INHALATION_SPRAY | Freq: Every day | RESPIRATORY_TRACT | Status: DC

## 2015-05-11 MED ORDER — PREDNISONE 10 MG PO TABS: ORAL_TABLET | ORAL | Status: DC

## 2015-05-11 NOTE — Patient Instructions (Addendum)
GO TO THE LAB : Get the blood work    GO TO THE FRONT DESK Schedule a routine office visit or check up to be done in  6 months Please be fasting   For pain: Stretching Prednisone as prescribed Tylenol as needed Call if no better in few days   For asthma: Continue Qvar, albuterol as needed For cough, take Mucinex DM  if no better in the next week or so please let us know.  For nausea Start taking omeprazole 1 tablet before breakfast for the next months. Will schedule ultrasound If you now she is not better in the next 3 or 4 weeks please let us know   Check the  blood pressure 2 or 3 times a   Week y  Be sure your blood pressure is between 110/65 and  135/85.  if it is consistently higher or lower, let me know    Think about calorie counting

## 2015-05-11 NOTE — Progress Notes (Signed)
Pre visit review using our clinic review tool, if applicable. No additional management support is needed unless otherwise documented below in the visit note. 

## 2015-05-11 NOTE — Progress Notes (Signed)
Subjective:    Patient ID: Monica Cantu, female    DOB: 1985-11-25, 30 y.o.   MRN: [id number]  DOS:  05/11/2015 Type of visit - description : New patient, multiple concerns Interval history: Elevated BP: Long history of mildly elevated BP, went to her gynecologist 04/24/2015, BP was elevated, was prescribed a medication but never took it. Ambulatory BPs since then normal.  Asthma: On Qvar, usually well-controlled however a week ago developed a cold: Runny nose, sore throat, increased cough. Using albuterol twice a day since then.  One-year history of low back pain, left side, some radiation to the posterior and lateral left leg. Denies any injury, has been taking ibuprofen without much help.  Also nausea for a month, on and off, mostly in the morning, not really postprandial, nausea may last few hours.  Review of Systems  Denies fever chills No vomiting, diarrhea, blood in the stools. Occasionally has a ill-defined but generalized abdominal pain >>> "I always have that". Denies sputum production. No dysuria or gross hematuria. No headache. Not on  birth control pills, last menstrual period 04/30/2015   Past Medical History  Diagnosis Date  . Allergy   . Asthma   . Hypertension     Past Surgical History  Procedure Laterality Date  . Tonsillectomy and adenoidectomy    . Dilation and curettage of uterus      Social History   Social History  . Marital Status: Divorced    Spouse Name: N/A  . Number of Children: 2  . Years of Education: N/A   Occupational History  . 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 Smoking status: Never Smoker   . Smokeless tobacco: Never Used  . Alcohol Use: No  . Drug Use: No  . Sexual Activity: Not Currently    Birth Control/ Protection: Abstinence   Other Topics Concern  . Not on file   Social History Narrative   Household-- pt and children   Family History  Problem Relation Age of Onset  .  Hyperlipidemia Father   . Diabetes Maternal Grandmother   . Heart disease Paternal Grandmother   . Hypertension Paternal Grandmother   . Colon cancer Neg Hx   . Breast cancer Neg Hx         Medication List       This list is accurate as of: 05/11/15  6:13 PM.  Always use your most recent med list.               albuterol 108 (90 Base) MCG/ACT inhaler  Commonly known as:  PROVENTIL HFA;VENTOLIN HFA  Inhale 1-2 puffs into the lungs every 4 (four) hours as needed for wheezing or shortness of breath.     beclomethasone 80 MCG/ACT inhaler  Commonly known as:  QVAR  Inhale 2 puffs into the lungs daily. Reported on 05/11/2015     cetirizine 10 MG tablet  Commonly known as:  ZYRTEC  Take 10 mg by mouth daily.     ibuprofen 800 MG tablet  Commonly known as:  ADVIL,MOTRIN  Take 1 tablet (800 mg total) by mouth every 8 (eight) hours as needed.     predniSONE 10 MG tablet  Commonly known as:  DELTASONE  4 tablets x 2 days, 3 tabs x 2 days, 2 tabs x 2 days, 1 tab x 2 days           Objective:   Physical Exam BP 126/74 mmHg  Pulse 81  Temp(Src) 98.6 F (37 C) (Oral)  Ht  (1.651 m)  Wt 375 lb (170.099 kg)  BMI 62.40 kg/m2  SpO2 97%  LMP 04/29/2015 (Approximate)  General:   Well developed, well nourished . NAD.  HEENT:  Normocephalic . Face symmetric, atraumatic Lungs:  CTA B Normal respiratory effort, no intercostal retractions, no accessory muscle use. Heart: RRR,  no murmur.  no pretibial edema bilaterally  Abdomen:  Not distended, soft, generalized tenderness however the tenderness is more noticeable at the epigastric and right upper quadrant area. No mass, no rebound. Good bowel sounds. Skin: Not pale. Not jaundice Neurologic:  alert & oriented X3.  Speech normal, gait appropriate for age and unassisted. DTRs symmetric, motor symmetric, posture slightly antalgic, straight leg test with questionable + on the left. Psych--  Cognition and judgment appear  intact.  Cooperative with normal attention span and concentration.  Behavior appropriate. No anxious or depressed appearing.    Assessment & Plan:   Assessment Elevated BP without diagnosis of hypertension Asthma Allergies Morbid obesity  PLAN: Elevated BP without the dx of HTN: Continue monitor ambulatory BPs, check a CMP and CBC Asthma:  RF Qvar, usually well-controlled, slightly exacerbated for few days due to a URI; use albuterol as needed and if symptoms persist let me know. Back pain: With radiation to the left leg, question off + straight leg test. Recommend stretching, prednisone for a few days, if not better let me know. Nausea: Nausea with generalized abdominal discomfort mostly at the epigastric area and right upper quadrant. UPT (-) Plan: PPIs, get ultrasound, r/o GB Morbid obesity: Check a TSH,A1c. Extensive discussion about diet, exercise ok but low impact. Calorie counting? RTC 6 months. See instructions

## 2015-05-12 LAB — HEMOGLOBIN A1C
HEMOGLOBIN A1C: 6 % — AB (ref <5.7)
Mean Plasma Glucose: 126 mg/dL — ABNORMAL HIGH (ref <117)

## 2015-05-12 LAB — URINE CULTURE: Colony Count: >=100000

## 2015-05-12 LAB — TSH: TSH: 0.315 u[IU]/mL — ABNORMAL LOW (ref 0.350–4.500)

## 2015-05-14 LAB — T3, FREE: T3, Free: 2.4 pg/mL (ref 2.3–4.2)

## 2015-05-14 LAB — T4, FREE: Free T4: 1.5 ng/dL (ref 0.8–1.8)

## 2015-05-21 ENCOUNTER — Ambulatory Visit: Payer: Managed Care, Other (non HMO) | Admitting: Skilled Nursing Facility1

## 2015-05-29 ENCOUNTER — Encounter: Payer: Self-pay | Admitting: Internal Medicine

## 2015-05-29 ENCOUNTER — Telehealth: Payer: Self-pay | Admitting: Internal Medicine

## 2015-05-29 ENCOUNTER — Ambulatory Visit (INDEPENDENT_AMBULATORY_CARE_PROVIDER_SITE_OTHER): Payer: BLUE CROSS/BLUE SHIELD | Admitting: Internal Medicine

## 2015-05-29 ENCOUNTER — Encounter: Payer: Self-pay | Admitting: *Deleted

## 2015-05-29 VITALS — BP 118/64 | HR 55 | Temp 99.5°F | Ht 65.0 in | Wt 373.4 lb

## 2015-05-29 DIAGNOSIS — R05 Cough: Secondary | ICD-10-CM | POA: Diagnosis not present

## 2015-05-29 DIAGNOSIS — R059 Cough, unspecified: Secondary | ICD-10-CM

## 2015-05-29 DIAGNOSIS — J101 Influenza due to other identified influenza virus with other respiratory manifestations: Secondary | ICD-10-CM | POA: Diagnosis not present

## 2015-05-29 DIAGNOSIS — J029 Acute pharyngitis, unspecified: Secondary | ICD-10-CM | POA: Diagnosis not present

## 2015-05-29 DIAGNOSIS — Z09 Encounter for follow-up examination after completed treatment for conditions other than malignant neoplasm: Secondary | ICD-10-CM

## 2015-05-29 DIAGNOSIS — R5081 Fever presenting with conditions classified elsewhere: Secondary | ICD-10-CM | POA: Diagnosis not present

## 2015-05-29 LAB — POCT INFLUENZA A/B
INFLUENZA A, POC: POSITIVE — AB
INFLUENZA B, POC: NEGATIVE

## 2015-05-29 LAB — POCT RAPID STREP A (OFFICE): RAPID STREP A SCREEN: NEGATIVE

## 2015-05-29 MED ORDER — OSELTAMIVIR PHOSPHATE 75 MG PO CAPS: 75.0000 mg | ORAL_CAPSULE | Freq: Two times a day (BID) | ORAL | Status: DC

## 2015-05-29 NOTE — Telephone Encounter (Signed)
Noted  

## 2015-05-29 NOTE — Telephone Encounter (Signed)
thx

## 2015-05-29 NOTE — Telephone Encounter (Signed)
FYI

## 2015-05-29 NOTE — Telephone Encounter (Signed)
Pt has an appt scheduled with Dr. Drue Novel today (05/29/15) at 4 pm.

## 2015-05-29 NOTE — Patient Instructions (Signed)
Rest, fluids , tylenol  For cough:  Take Mucinex DM twice a day as needed until better  For nasal congestion: Use OTC Nasocort or Flonase : 2 nasal sprays on each side of the nose in the morning until you feel better   Take TAMIFLU as prescribed   Call if not gradually better over the next  10 days  Call anytime if the symptoms are severe, you have high fever, short of breath, chest pain.

## 2015-05-29 NOTE — Progress Notes (Signed)
Subjective:    Patient ID: Monica Cantu, female    DOB: July 22, 1985, 30 y.o.   MRN: [id number]  DOS:  05/29/2015 Type of visit - description : Acute visit   Interval history: Symptoms started yesterday afternoon with sweats, moderate cough, later on developed fever up to 101.2, generalized aches, neck pain, ear congestion and some dizziness. Took ibuprofen which helped with the fever.   Review of Systems  Had some nausea. No vomiting or diarrhea Denies any sinus pain but some congestion Has a global headache No rash. Patient is not on birth control pills but she is not sexually active. Last menstrual period a week ago.  Past Medical History  Diagnosis Date  . Allergy   . Asthma   . Hypertension     Past Surgical History  Procedure Laterality Date  . Tonsillectomy and adenoidectomy    . Dilation and curettage of uterus      Social History   Social History  . Marital Status: Divorced    Spouse Name: N/A  . Number of Children: 2  . Years of Education: N/A   Occupational History  . 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 Smoking status: Never Smoker   . Smokeless tobacco: Never Used  . Alcohol Use: No  . Drug Use: No  . Sexual Activity: Not Currently    Birth Control/ Protection: Abstinence   Other Topics Concern  . Not on file   Social History Narrative   Household-- pt and children        Medication List       This list is accurate as of: 05/29/15 11:59 PM.  Always use your most recent med list.               albuterol 108 (90 Base) MCG/ACT inhaler  Commonly known as:  PROVENTIL HFA;VENTOLIN HFA  Inhale 1-2 puffs into the lungs every 4 (four) hours as needed for wheezing or shortness of breath.     beclomethasone 80 MCG/ACT inhaler  Commonly known as:  QVAR  Inhale 2 puffs into the lungs daily. Reported on 05/11/2015     cetirizine 10 MG tablet  Commonly known as:  ZYRTEC  Take 10 mg by mouth daily.     ibuprofen 800 MG tablet  Commonly known as:  ADVIL,MOTRIN  Take 1 tablet (800 mg total) by mouth every 8 (eight) hours as needed.     oseltamivir 75 MG capsule  Commonly known as:  TAMIFLU  Take 1 capsule (75 mg total) by mouth 2 (two) times daily.           Objective:   Physical Exam BP 118/64 mmHg  Pulse 55  Temp(Src) 99.5 F (37.5 C) (Oral)  Ht  (1.651 m)  Wt 373 lb 6.4 oz (169.373 kg)  BMI 62.14 kg/m2  SpO2 99%  LMP 04/29/2015 (Approximate) General:   Well developed, well nourished . NAD.  HEENT:  Normocephalic . Face symmetric, atraumatic. Nose quite congested, TMs slightly bulge but no red. Sinuses no TTP. Throat not red. Lungs:  Clear except for a few rhonchi. Normal respiratory effort, no intercostal retractions, no accessory muscle use. Heart: RRR,  no murmur.  No pretibial edema bilaterally  Skin: Not pale. Not jaundice X line Neurologic:  alert & oriented X3.  Speech normal, gait appropriate for age and unassisted Psych--  Cognition and judgment appear intact.  Cooperative with normal attention span and concentration.  Behavior appropriate.  No anxious or depressed appearing.      Assessment & Plan:   Assessment Elevated BP without diagnosis of hypertension Asthma Allergies Morbid obesity  PLAN:  Influenza: Patient tested positive for influenza A, negative for strep throat and influenza B. I discussed the diagnosis with her, this is not a URI, has the potential to get worse. Recommend Tamiflu, rest, fluids, work excuse; also, one of her children is not sick and advised to consider contacting the pediatrician to see about prophylaxis with Tamiflu. Was recently seen with nausea and generalized abdominal discomfort, feeling much better, ultrasound pending.

## 2015-05-29 NOTE — Telephone Encounter (Signed)
Patient called stating that she has a fever of 101.7. Transferred to [facility]. Spoke with Amy

## 2015-05-29 NOTE — Progress Notes (Signed)
Pre visit review using our clinic review tool, if applicable. No additional management support is needed unless otherwise documented below in the visit note. 

## 2015-05-29 NOTE — Telephone Encounter (Signed)
PLEASE NOTE: All timestamps contained within this report are represented as [place] Standard Time. CONFIDENTIALTY NOTICE: This fax transmission is intended only for the addressee. It contains information that is legally privileged, confidential or otherwise protected from use or disclosure. If you are not the intended recipient, you are strictly prohibited from reviewing, disclosing, copying using or disseminating any of this information or taking any action in reliance on or regarding this information. If you have received this fax in error, please notify us immediately by telephone so that we can arrange for its return to [place]. Phone: [phone], Toll-Free: [phone], Fax: [phone] Page: 1 of 1 Call Id: [id number] [facility] - Client TELEPHONE ADVICE RECORD [facility] Patient Name: Monica Cantu DOB: [birth year] Initial Comment Caller states c/o fever 101.7, body aches, sore throat Nurse Assessment Nurse: Elijah Birk, RN, Stark Bray Date/Time (Eastern Time): 05/29/2015 10:24:30 AM Confirm and document reason for call. If symptomatic, describe symptoms. You must click the next button to save text entered. ---Caller states c/o fever 101.7, body aches, sore throat. Has been around those with the flu. Has the patient traveled out of the country within the last 30 days? ---Not Applicable Does the patient have any new or worsening symptoms? ---Yes Will a triage be completed? ---Yes Related visit to physician within the last 2 weeks? ---No Does the PT have any chronic conditions? (i.e. diabetes, asthma, etc.) ---Yes List chronic conditions. ---asthma Is the patient pregnant or possibly pregnant? (Ask all females between the ages of 9-55) ---No Is this a behavioral health or substance abuse call? ---No Guidelines Guideline Title Affirmed Question Affirmed Notes Influenza - Seasonal Earache Final Disposition User See Physician within 24 Hours  [place], RN, Lynda Referrals REFERRED TO PCP OFFICE Disagree/Comply: Comply

## 2015-05-31 DIAGNOSIS — Z09 Encounter for follow-up examination after completed treatment for conditions other than malignant neoplasm: Secondary | ICD-10-CM | POA: Insufficient documentation

## 2015-05-31 NOTE — Assessment & Plan Note (Signed)
Influenza: Patient tested positive for influenza A, negative for strep throat and influenza B. I discussed the diagnosis with her, this is not a URI, has the potential to get worse. Recommend Tamiflu, rest, fluids, work excuse; also, one of her children is not sick and advised to consider contacting the pediatrician to see about prophylaxis with Tamiflu. Was recently seen with nausea and generalized abdominal discomfort, feeling much better, ultrasound pending.

## 2015-06-07 ENCOUNTER — Ambulatory Visit (HOSPITAL_COMMUNITY): Admission: RE | Admit: 2015-06-07 | Payer: BLUE CROSS/BLUE SHIELD | Source: Ambulatory Visit

## 2015-06-13 ENCOUNTER — Ambulatory Visit: Payer: Managed Care, Other (non HMO) | Admitting: Gastroenterology

## 2015-07-18 ENCOUNTER — Encounter: Payer: Self-pay | Admitting: Certified Nurse Midwife

## 2015-07-18 ENCOUNTER — Ambulatory Visit (INDEPENDENT_AMBULATORY_CARE_PROVIDER_SITE_OTHER): Payer: BLUE CROSS/BLUE SHIELD | Admitting: Certified Nurse Midwife

## 2015-07-18 VITALS — BP 181/78 | HR 85 | Temp 98.7°F | Wt 375.0 lb

## 2015-07-18 DIAGNOSIS — Z113 Encounter for screening for infections with a predominantly sexual mode of transmission: Secondary | ICD-10-CM

## 2015-07-18 NOTE — Progress Notes (Signed)
Patient ID: Monica Cantu, female   DOB: 03/25/1986, 30 y.o.   MRN: [id number]  Chief Complaint  Patient presents with  . Exposure to STD    HPI Monica Cantu is a 30 y.o. female.  Here for STD exam.  States that spouse that she is separated from has cheated on her several times.  She is in counseling, denies any depression right now.  Currently employed.  Denies any other problems.     HPI  Past Medical History  Diagnosis Date  . Allergy   . Asthma   . Hypertension     Past Surgical History  Procedure Laterality Date  . Tonsillectomy and adenoidectomy    . Dilation and curettage of uterus      Family History  Problem Relation Age of Onset  . Hyperlipidemia Father   . Diabetes Maternal Grandmother   . Heart disease Paternal Grandmother   . Hypertension Paternal Grandmother   . Colon cancer Neg Hx   . Breast cancer Neg Hx     Social History Social History  Substance Use Topics  . Smoking status: Never Smoker   . Smokeless tobacco: Never Used  . Alcohol Use: No    No Known Allergies  Current Outpatient Prescriptions  Medication Sig Dispense Refill  . albuterol (PROVENTIL HFA;VENTOLIN HFA) 108 (90 Base) MCG/ACT inhaler Inhale 1-2 puffs into the lungs every 4 (four) hours as needed for wheezing or shortness of breath. 18 g 2  . beclomethasone (QVAR) 80 MCG/ACT inhaler Inhale 2 puffs into the lungs daily. Reported on 05/11/2015 8.7 g 6  . cetirizine (ZYRTEC) 10 MG tablet Take 10 mg by mouth daily.    Marland Kitchen ibuprofen (ADVIL,MOTRIN) 800 MG tablet Take 1 tablet (800 mg total) by mouth every 8 (eight) hours as needed. 90 tablet 1   No current facility-administered medications for this visit.    Review of Systems Review of Systems Constitutional: negative for fatigue and weight loss Respiratory: negative for cough and wheezing Cardiovascular: negative for chest pain, fatigue and palpitations Gastrointestinal: negative for abdominal pain and change in bowel  habits Genitourinary:negative Integument/breast: negative for nipple discharge Musculoskeletal:negative for myalgias Neurological: negative for gait problems and tremors Behavioral/Psych: negative for abusive relationship, depression Endocrine: negative for temperature intolerance     Blood pressure 181/78, pulse 85, temperature 98.7 F (37.1 C), weight 375 lb (170.099 kg).  Physical Exam Physical Exam General:   alert  Skin:   no rash or abnormalities  Lungs:   clear to auscultation bilaterally  Heart:   regular rate and rhythm, S1, S2 normal, no murmur, click, rub or gallop  Breasts:   deferred  Abdomen:  normal findings: no organomegaly, soft, non-tender and no hernia  obese  Pelvis:  External genitalia: normal general appearance Urinary system: urethral meatus normal and bladder without fullness, nontender Vaginal: normal without tenderness, induration or masses Cervix: normal appearance Adnexa: normal bimanual exam Uterus: anteverted and non-tender, normal size    50% of 15 min visit spent on counseling and coordination of care.   Data Reviewed Previous medical hx, meds, labs  Assessment     STD screening exam     Plan    Orders Placed This Encounter  Procedures  . HIV antibody (with reflex)  . Hepatitis B surface antigen  . RPR  . Hepatitis C antibody  . NuSwab Vaginitis Plus (VG+)   No orders of the defined types were placed in this encounter.     Possible management options  include: pelvic US for left lower pelvic pain Follow up as needed.

## 2015-07-19 LAB — HEPATITIS B SURFACE ANTIGEN: HEP B S AG: NEGATIVE

## 2015-07-19 LAB — HEPATITIS C ANTIBODY: Hep C Virus Ab: <0.1 s/co ratio (ref 0.0–0.9)

## 2015-07-19 LAB — HIV ANTIBODY (ROUTINE TESTING W REFLEX): HIV Screen 4th Generation wRfx: NONREACTIVE

## 2015-07-19 LAB — RPR: RPR Ser Ql: NONREACTIVE

## 2015-07-21 LAB — NUSWAB VAGINITIS PLUS (VG+)
CANDIDA ALBICANS, NAA: NEGATIVE
Candida glabrata, NAA: NEGATIVE
Chlamydia trachomatis, NAA: NEGATIVE
NEISSERIA GONORRHOEAE, NAA: NEGATIVE
Trich vag by NAA: NEGATIVE

## 2015-07-25 ENCOUNTER — Ambulatory Visit (INDEPENDENT_AMBULATORY_CARE_PROVIDER_SITE_OTHER): Payer: BLUE CROSS/BLUE SHIELD | Admitting: Internal Medicine

## 2015-07-25 ENCOUNTER — Encounter: Payer: Self-pay | Admitting: Internal Medicine

## 2015-07-25 VITALS — BP 124/68 | HR 70 | Temp 98.1°F | Ht 65.0 in | Wt 377.2 lb

## 2015-07-25 DIAGNOSIS — R21 Rash and other nonspecific skin eruption: Secondary | ICD-10-CM | POA: Diagnosis not present

## 2015-07-25 DIAGNOSIS — Z09 Encounter for follow-up examination after completed treatment for conditions other than malignant neoplasm: Secondary | ICD-10-CM

## 2015-07-25 DIAGNOSIS — L299 Pruritus, unspecified: Secondary | ICD-10-CM

## 2015-07-25 MED ORDER — NYSTATIN-TRIAMCINOLONE 100000-0.1 UNIT/GM-% EX OINT: 1.0000 "application " | TOPICAL_OINTMENT | Freq: Two times a day (BID) | CUTANEOUS | Status: DC

## 2015-07-25 MED ORDER — PREDNISONE 10 MG PO TABS: ORAL_TABLET | ORAL | Status: DC

## 2015-07-25 NOTE — Progress Notes (Signed)
Pre visit review using our clinic review tool, if applicable. No additional management support is needed unless otherwise documented below in the visit note. 

## 2015-07-25 NOTE — Patient Instructions (Signed)
Continue with Zyrtec daily  Prednisone for a few days as prescribed  Apply the cream twice a day for 10 days to the right leg  Call if not gradually improving, call if you get worse or ig you have fever or chills.

## 2015-07-25 NOTE — Progress Notes (Signed)
Subjective:    Patient ID: Monica Cantu, female    DOB: 02/01/1986, 30 y.o.   MRN: [id number]  DOS:  07/25/2015 Type of visit - description : Acute visit Interval history: One-week history of generalized itching described as stinging, tingling sensation. Affecting the chest, abdomen, extremities. No actual rash except for the leg. Has taken Benadryl po and OTC cortisone cream without help him. 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  She did develop a rash few days ago  on the left leg, + itch.   Review of Systems Denies fever chills No wheezing. No lip or tongue swelling No new exposure: No new pets, shampoo, lotions  Past Medical History  Diagnosis Date  . Allergy   . Asthma   . Hypertension     Past Surgical History  Procedure Laterality Date  . Tonsillectomy and adenoidectomy    . Dilation and curettage of uterus      Social History   Social History  . Marital Status: Divorced    Spouse Name: N/A  . Number of Children: 2  . Years of Education: N/A   Occupational History  . 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 Smoking status: Never Smoker   . Smokeless tobacco: Never Used  . Alcohol Use: No  . Drug Use: No  . Sexual Activity: Not Currently    Birth Control/ Protection: Abstinence   Other Topics Concern  . Not on file   Social History Narrative   Household-- pt and children        Medication List       This list is accurate as of: 07/25/15 11:59 PM.  Always use your most recent med list.               albuterol 108 (90 Base) MCG/ACT inhaler  Commonly known as:  PROVENTIL HFA;VENTOLIN HFA  Inhale 1-2 puffs into the lungs every 4 (four) hours as needed for wheezing or shortness of breath.     cetirizine 10 MG tablet  Commonly known as:  ZYRTEC  Take 10 mg by mouth daily.     ibuprofen 800 MG tablet  Commonly known as:  ADVIL,MOTRIN  Take 1 tablet (800 mg total) by mouth every 8 (eight)  hours as needed.     nystatin-triamcinolone ointment  Commonly known as:  MYCOLOG  Apply 1 application topically 2 (two) times daily.     predniSONE 10 MG tablet  Commonly known as:  DELTASONE  3 tabs x 3 days, 2 tabs x 3 days, 1 tab x 3 days           Objective:   Physical Exam  Skin:      BP 124/68 mmHg  Pulse 70  Temp(Src) 98.1 F (36.7 C) (Oral)  Ht 5\' 5"  (1.651 m)  Wt 377 lb 4 oz (171.119 kg)  BMI 62.78 kg/m2  SpO2 99%  LMP 07/09/2015 (Approximate) General:   Well developed, well nourished . NAD.  HEENT:  Normocephalic . Face symmetric, atraumatic Lips normal Skin:  Neck, abdomen, chest, face, back and neck: No rash. Wrists and interdigital spaces clear. See graphic Neurologic:  alert & oriented X3.  Speech normal, gait appropriate for age and unassisted Psych--  Cognition and judgment appear intact.  Cooperative with normal attention span and concentration.  Behavior appropriate. No anxious or depressed appearing.      Assessment & Plan:   Assessment Elevated BP without diagnosis of hypertension  Asthma Allergies Morbid obesity  PLAN:  Generalized itching: Etiology unclear, she has dry skin which is a chronic problem. Allergies? Recommend to continue with Zyrtec, prednisone for a few days. Rash, right leg: Fungal? Recommend antifungal cream Exposed to scabies?: No evidence of scabies on clinical grounds. RTC if not better Next appointment already scheduled for 11-2015

## 2015-07-26 NOTE — Assessment & Plan Note (Signed)
Generalized itching: Etiology unclear, she has dry skin which is a chronic problem. Allergies? Recommend to continue with Zyrtec, prednisone for a few days. Rash, right leg: Fungal? Recommend antifungal cream Exposed to scabies?: No evidence of scabies on clinical grounds. RTC if not better Next appointment already scheduled for 11-2015

## 2015-08-31 ENCOUNTER — Ambulatory Visit (INDEPENDENT_AMBULATORY_CARE_PROVIDER_SITE_OTHER): Payer: BLUE CROSS/BLUE SHIELD | Admitting: Medical

## 2015-08-31 ENCOUNTER — Encounter: Payer: Self-pay | Admitting: Medical

## 2015-08-31 VITALS — BP 120/70 | HR 77 | Temp 98.1°F | Ht 65.0 in | Wt 368.2 lb

## 2015-08-31 DIAGNOSIS — N390 Urinary tract infection, site not specified: Secondary | ICD-10-CM | POA: Diagnosis not present

## 2015-08-31 DIAGNOSIS — M549 Dorsalgia, unspecified: Secondary | ICD-10-CM | POA: Diagnosis not present

## 2015-08-31 DIAGNOSIS — R300 Dysuria: Secondary | ICD-10-CM | POA: Diagnosis not present

## 2015-08-31 DIAGNOSIS — R82998 Other abnormal findings in urine: Secondary | ICD-10-CM

## 2015-08-31 MED ORDER — PHENAZOPYRIDINE HCL 100 MG PO TABS: 100.0000 mg | ORAL_TABLET | Freq: Three times a day (TID) | ORAL | Status: DC | PRN

## 2015-08-31 MED ORDER — NITROFURANTOIN MONOHYD MACRO 100 MG PO CAPS
100.0000 mg | ORAL_CAPSULE | Freq: Two times a day (BID) | ORAL | Status: DC
Start: 2015-08-31 — End: 2015-09-03

## 2015-08-31 NOTE — Patient Instructions (Addendum)
You appear to have a urinary tract infection. I am prescribing macrobid antibiotic for the probable infection. Hydrate well. I am sending out a urine culture. During the interim if your signs and symptoms worsen rather than improving please notify us. We will notify your when the culture results are back.  Rx pyridium for pain.  Follow up in 7 days or as needed. 

## 2015-08-31 NOTE — Progress Notes (Signed)
Subjective:    Patient ID: Monica Cantu, female    DOB: 1985-06-17, 30 y.o.   MRN: [id number]  HPI   Pt in today reporting urinary symptoms. 3-4 days  Dysuria- yes Frequent urination-yes Hesitancy-yes Suprapubic pressure-yes Fever-no chills-no Nausea-no Vomiting-no CVA pain-no(points to mid lspine) History of UTI- 2 a year on average.  Gross hematuria-no  LMP- Aug 10, 2015 and was normal.   Review of Systems  Constitutional: Negative for fever, chills and fatigue.  Respiratory: Negative for cough, shortness of breath and wheezing.   Cardiovascular: Negative for chest pain and palpitations.  Genitourinary: Positive for dysuria, urgency and frequency. Negative for flank pain, vaginal bleeding and vaginal pain.  Musculoskeletal:       See hpi  Skin: Negative for rash.  Neurological: Negative for dizziness and headaches.  Hematological: Negative for adenopathy. Does not bruise/bleed easily.    Past Medical History  Diagnosis Date  . Allergy   . Asthma   . Hypertension      Social History   Social History  . Marital Status: Divorced    Spouse Name: N/A  . Number of Children: 2  . Years of Education: N/A   Occupational History  . 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 Smoking status: Never Smoker   . Smokeless tobacco: Never Used  . Alcohol Use: No  . Drug Use: No  . Sexual Activity: Not Currently    Birth Control/ Protection: Abstinence   Other Topics Concern  . Not on file   Social History Narrative   Household-- pt and children    Past Surgical History  Procedure Laterality Date  . Tonsillectomy and adenoidectomy    . Dilation and curettage of uterus      Family History  Problem Relation Age of Onset  . Hyperlipidemia Father   . Diabetes Maternal Grandmother   . Heart disease Paternal Grandmother   . Hypertension Paternal Grandmother   . Colon cancer Neg Hx   . Breast cancer Neg Hx     No Known  Allergies  Current Outpatient Prescriptions on File Prior to Visit  Medication Sig Dispense Refill  . albuterol (PROVENTIL HFA;VENTOLIN HFA) 108 (90 Base) MCG/ACT inhaler Inhale 1-2 puffs into the lungs every 4 (four) hours as needed for wheezing or shortness of breath. 18 g 2  . cetirizine (ZYRTEC) 10 MG tablet Take 10 mg by mouth daily.    Marland Kitchen ibuprofen (ADVIL,MOTRIN) 800 MG tablet Take 1 tablet (800 mg total) by mouth every 8 (eight) hours as needed. 90 tablet 1  . nystatin-triamcinolone ointment (MYCOLOG) Apply 1 application topically 2 (two) times daily. 60 g 0   No current facility-administered medications on file prior to visit.    BP 120/70 mmHg  Pulse 77  Temp(Src) 98.1 F (36.7 C) (Oral)  Ht  (1.651 m)  Wt 368 lb 3.2 oz (167.014 kg)  BMI 61.27 kg/m2  SpO2 98%  LMP 08/10/2015       Objective:   Physical Exam  General  Mental Status- Alert. Orientation- Orientation x 4.   Skin General:- Normal. Moisture- Dry. Temperature- Warm.  Heart Ausculation-RRR  Lungs Ausculation- Clear, even, unlabored bilaterlly.   Abdomen Palpation/Percussion: Palpation and Percussion of the abdomen reveal- Non Tender, No Rebound tenderness, No Rigidity(guarding), No Palpable abdominal masses and No jar tenderness. No suprapubic tenderness. Liver:-Normal. Spleen:- Normal. Other Characteristics- No Costovertebral angle tenderness- Left or Costovertebral angle tenderness- Right.  Auscultation: Auscultation of  the abdomen reveals- Bowel Sounds normal.      Assessment & Plan:  You appear to have a urinary tract infection. I am prescribing  macrobid antibiotic for the probable infection. Hydrate well. I am sending out a urine culture. During the interim if your signs and symptoms worsen rather than improving please notify us. We will notify your when the culture results are back.  Rx pyridium for pain  Follow up in 7 days or as needed.

## 2015-08-31 NOTE — Progress Notes (Signed)
Pre visit review using our clinic review tool, if applicable. No additional management support is needed unless otherwise documented below in the visit note. 

## 2015-09-03 ENCOUNTER — Telehealth: Payer: Self-pay | Admitting: Medical

## 2015-09-03 LAB — URINE CULTURE: Colony Count: >=100000

## 2015-09-03 MED ORDER — CIPROFLOXACIN HCL 500 MG PO TABS: 500.0000 mg | ORAL_TABLET | Freq: Two times a day (BID) | ORAL | Status: DC

## 2015-09-03 NOTE — Telephone Encounter (Signed)
Opened to review antibiotics given for uti.

## 2015-09-04 NOTE — Progress Notes (Signed)
Quick Note:  Pt has seen results on MyChart and message also sent for patient to call back if any questions. ______ 

## 2015-09-07 ENCOUNTER — Telehealth: Payer: Self-pay | Admitting: Internal Medicine

## 2015-09-07 MED ORDER — FLUCONAZOLE 150 MG PO TABS: 150.0000 mg | ORAL_TABLET | Freq: Once | ORAL | Status: DC

## 2015-09-07 NOTE — Telephone Encounter (Signed)
Can be reached: [phone] Pharmacy: [id number] - [place], [place] - [address]  Reason for call: Pt developing yeast inf from abx. She is requesting diflucan be sent in.

## 2015-09-07 NOTE — Telephone Encounter (Signed)
Please advise 

## 2015-09-07 NOTE — Telephone Encounter (Signed)
Diflucan sent in for yeast infection

## 2015-10-05 ENCOUNTER — Encounter: Payer: Self-pay | Admitting: Certified Nurse Midwife

## 2015-10-10 ENCOUNTER — Ambulatory Visit (INDEPENDENT_AMBULATORY_CARE_PROVIDER_SITE_OTHER): Payer: BLUE CROSS/BLUE SHIELD | Admitting: Medical

## 2015-10-10 ENCOUNTER — Ambulatory Visit (HOSPITAL_BASED_OUTPATIENT_CLINIC_OR_DEPARTMENT_OTHER)
Admission: RE | Admit: 2015-10-10 | Discharge: 2015-10-10 | Disposition: A | Discharge status: Home / Self Care | Payer: BLUE CROSS/BLUE SHIELD | Source: Ambulatory Visit | Attending: Medical | Admitting: Medical

## 2015-10-10 ENCOUNTER — Encounter: Payer: Self-pay | Admitting: Medical

## 2015-10-10 VITALS — BP 120/80 | HR 86 | Temp 98.1°F | Ht 65.0 in | Wt 373.0 lb

## 2015-10-10 DIAGNOSIS — M25561 Pain in right knee: Secondary | ICD-10-CM | POA: Diagnosis not present

## 2015-10-10 DIAGNOSIS — M769 Unspecified enthesopathy, lower limb, excluding foot: Secondary | ICD-10-CM | POA: Diagnosis not present

## 2015-10-10 DIAGNOSIS — Z3009 Encounter for other general counseling and advice on contraception: Secondary | ICD-10-CM | POA: Diagnosis not present

## 2015-10-10 MED ORDER — NORGESTIM-ETH ESTRAD TRIPHASIC 0.18/0.215/0.25 MG-35 MCG PO TABS: 1.0000 | ORAL_TABLET | Freq: Every day | ORAL | Status: DC

## 2015-10-10 MED ORDER — DICLOFENAC SODIUM 75 MG PO TBEC: 75.0000 mg | DELAYED_RELEASE_TABLET | Freq: Two times a day (BID) | ORAL | Status: DC

## 2015-10-10 MED ORDER — TRAMADOL HCL 50 MG PO TABS: 50.0000 mg | ORAL_TABLET | Freq: Four times a day (QID) | ORAL | Status: DC | PRN

## 2015-10-10 NOTE — Progress Notes (Signed)
Pre visit review using our clinic review tool, if applicable. No additional management support is needed unless otherwise documented below in the visit note. 

## 2015-10-10 NOTE — Progress Notes (Signed)
Subjective:    Patient ID: Monica Cantu, female    DOB: 11/25/1985, 30 y.o.   MRN: [id number]  HPI  Pt in with rt knee pain. Pain for 3 days. Pt tried tylenol, icey hot and ibuprofen. Nothing has helped. Pt has no recent acute fall or injury. But reports remote fall about 3 months ago. But no significant pain at that time.  But also states off and on mild pain over past 1 month. But last 3 days severe pain.   Today she was crying due to pain. Patient points to lateral aspect of the knee.  LMP- last Friday.  [facility] to [facility]. Pt in past used ortho tricyclen.  Pt last papsmear was in Feb and normal.  Review of Systems  Constitutional: Negative for fever, chills and fatigue.  Respiratory: Negative for cough, shortness of breath and wheezing.   Cardiovascular: Negative for chest pain and palpitations.  Musculoskeletal:       Knee pain.  Skin: Negative for rash.  Hematological: Negative for adenopathy. Does not bruise/bleed easily.  Psychiatric/Behavioral: Negative for behavioral problems and confusion.   Past Medical History  Diagnosis Date  . Allergy   . Asthma   . Hypertension      Social History   Social History  . Marital Status: Divorced    Spouse Name: N/A  . Number of Children: 2  . Years of Education: N/A   Occupational History  . 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 Smoking status: Never Smoker   . Smokeless tobacco: Never Used  . Alcohol Use: No  . Drug Use: No  . Sexual Activity: Not Currently    Birth Control/ Protection: Abstinence   Other Topics Concern  . Not on file   Social History Narrative   Household-- pt and children    Past Surgical History  Procedure Laterality Date  . Tonsillectomy and adenoidectomy    . Dilation and curettage of uterus      Family History  Problem Relation Age of Onset  . Hyperlipidemia Father   . Diabetes Maternal Grandmother   . Heart disease Paternal Grandmother     . Hypertension Paternal Grandmother   . Colon cancer Neg Hx   . Breast cancer Neg Hx     No Known Allergies  Current Outpatient Prescriptions on File Prior to Visit  Medication Sig Dispense Refill  . albuterol (PROVENTIL HFA;VENTOLIN HFA) 108 (90 Base) MCG/ACT inhaler Inhale 1-2 puffs into the lungs every 4 (four) hours as needed for wheezing or shortness of breath. 18 g 2  . cetirizine (ZYRTEC) 10 MG tablet Take 10 mg by mouth daily.    Marland Kitchen. ibuprofen (ADVIL,MOTRIN) 800 MG tablet Take 1 tablet (800 mg total) by mouth every 8 (eight) hours as needed. 90 tablet 1  . nystatin-triamcinolone ointment (MYCOLOG) Apply 1 application topically 2 (two) times daily. 60 g 0   No current facility-administered medications on file prior to visit.    BP 120/80 mmHg  Pulse 86  Temp(Src) 98.1 F (36.7 C) (Oral)  Ht 5\' 5"  (1.651 m)  Wt 373 lb (169.192 kg)  BMI 62.07 kg/m2  SpO2 99%  LMP 10/03/2015       Objective:   Physical Exam   General- No acute distress. Pleasant patient. Neck- Full range of motion, no jvd Lungs- Clear, even and unlabored. Heart- regular rate and rhythm. Neurologic- CNII- XII grossly intact.   Rt knee- lateral aspect  mild tender. Tender over the tibial plateau area lateral aspect. No instablity. No crepitus.   Lt knee - no pain. Calfs- symmetric and negative homans sign(pt is obese). Large calfs but symmetric.     Assessment & Plan:  For your knee pain will get xray. Rx diclofenac for pain. If you need will also make tramadol available.  If your pain persists despite neg xray will consider referral to ortho to evaluate for meniscus injury.  Your pregnancy test was negative. rx ortho tricyclen with refills.(not reviewed contra indications and risk with pt.) Particularly not to smoke.  Follow up in 10-14 days or as needed  Exzavier Ruderman, Ramon DredgeEdward, VF CorporationPA-C

## 2015-10-10 NOTE — Patient Instructions (Addendum)
For your knee pain will get xray. Rx diclofenac for pain. If you need will also make tramadol available.  If your pain persists despite neg xray will consider referral to ortho to evaluate for meniscus injury.  Your pregnancy test was negative. rx ortho tricyclen with refills.  Follow up in 10-14 days or as needed

## 2015-10-12 ENCOUNTER — Other Ambulatory Visit: Payer: Self-pay | Admitting: Certified Nurse Midwife

## 2015-10-12 ENCOUNTER — Ambulatory Visit: Payer: Self-pay | Admitting: Certified Nurse Midwife

## 2015-10-24 ENCOUNTER — Other Ambulatory Visit: Payer: Self-pay | Admitting: Certified Nurse Midwife

## 2015-11-08 ENCOUNTER — Ambulatory Visit: Payer: BLUE CROSS/BLUE SHIELD | Admitting: Internal Medicine

## 2015-11-08 DIAGNOSIS — Z0289 Encounter for other administrative examinations: Secondary | ICD-10-CM

## 2015-11-15 ENCOUNTER — Encounter: Payer: Self-pay | Admitting: Internal Medicine

## 2015-12-31 ENCOUNTER — Emergency Department (HOSPITAL_COMMUNITY): Payer: BLUE CROSS/BLUE SHIELD

## 2015-12-31 ENCOUNTER — Emergency Department (HOSPITAL_COMMUNITY)
Admission: EM | Admit: 2015-12-31 | Discharge: 2016-01-01 | Disposition: A | Discharge status: Home / Self Care | Payer: BLUE CROSS/BLUE SHIELD | Attending: Emergency Medicine | Admitting: Emergency Medicine

## 2015-12-31 ENCOUNTER — Encounter (HOSPITAL_COMMUNITY): Payer: Self-pay | Admitting: Emergency Medicine

## 2015-12-31 DIAGNOSIS — R1011 Right upper quadrant pain: Secondary | ICD-10-CM

## 2015-12-31 DIAGNOSIS — I10 Essential (primary) hypertension: Secondary | ICD-10-CM | POA: Insufficient documentation

## 2015-12-31 DIAGNOSIS — Z79899 Other long term (current) drug therapy: Secondary | ICD-10-CM | POA: Insufficient documentation

## 2015-12-31 DIAGNOSIS — R079 Chest pain, unspecified: Secondary | ICD-10-CM | POA: Insufficient documentation

## 2015-12-31 DIAGNOSIS — M791 Myalgia, unspecified site: Secondary | ICD-10-CM

## 2015-12-31 DIAGNOSIS — Z791 Long term (current) use of non-steroidal anti-inflammatories (NSAID): Secondary | ICD-10-CM | POA: Insufficient documentation

## 2015-12-31 DIAGNOSIS — J45909 Unspecified asthma, uncomplicated: Secondary | ICD-10-CM | POA: Insufficient documentation

## 2015-12-31 DIAGNOSIS — R0602 Shortness of breath: Secondary | ICD-10-CM | POA: Insufficient documentation

## 2015-12-31 LAB — BASIC METABOLIC PANEL
ANION GAP: 7 (ref 5–15)
BUN: 11 mg/dL (ref 6–20)
CALCIUM: 9.2 mg/dL (ref 8.9–10.3)
CO2: 27 mmol/L (ref 22–32)
Chloride: 106 mmol/L (ref 101–111)
Creatinine, Ser: 0.98 mg/dL (ref 0.44–1.00)
GLUCOSE: 82 mg/dL (ref 65–99)
POTASSIUM: 3.5 mmol/L (ref 3.5–5.1)
Sodium: 140 mmol/L (ref 135–145)

## 2015-12-31 LAB — I-STAT BETA HCG BLOOD, ED (MC, WL, AP ONLY): I-stat hCG, quantitative: <5 m[IU]/mL (ref <5)

## 2015-12-31 LAB — CBC
HEMATOCRIT: 36.3 % (ref 36.0–46.0)
HEMOGLOBIN: 11.8 g/dL — AB (ref 12.0–15.0)
MCH: 27.8 pg (ref 26.0–34.0)
MCHC: 32.5 g/dL (ref 30.0–36.0)
MCV: 85.6 fL (ref 78.0–100.0)
Platelets: 223 10*3/uL (ref 150–400)
RBC: 4.24 MIL/uL (ref 3.87–5.11)
RDW: 14.6 % (ref 11.5–15.5)
WBC: 9 10*3/uL (ref 4.0–10.5)

## 2015-12-31 LAB — HEPATIC FUNCTION PANEL
ALBUMIN: 3.9 g/dL (ref 3.5–5.0)
ALT: 13 U/L — ABNORMAL LOW (ref 14–54)
AST: 18 U/L (ref 15–41)
Alkaline Phosphatase: 51 U/L (ref 38–126)
BILIRUBIN INDIRECT: 0.6 mg/dL (ref 0.3–0.9)
Bilirubin, Direct: 0.1 mg/dL (ref 0.1–0.5)
TOTAL PROTEIN: 7.5 g/dL (ref 6.5–8.1)
Total Bilirubin: 0.7 mg/dL (ref 0.3–1.2)

## 2015-12-31 LAB — I-STAT TROPONIN, ED: TROPONIN I, POC: 0 ng/mL (ref 0.00–0.08)

## 2015-12-31 MED ORDER — CYCLOBENZAPRINE HCL 10 MG PO TABS: 10.0000 mg | ORAL_TABLET | Freq: Two times a day (BID) | ORAL | 0 refills | Status: DC | PRN

## 2015-12-31 MED ORDER — DICLOFENAC SODIUM 75 MG PO TBEC: 75.0000 mg | DELAYED_RELEASE_TABLET | Freq: Two times a day (BID) | ORAL | 0 refills | Status: DC

## 2015-12-31 MED ORDER — SODIUM CHLORIDE 0.9 % IV SOLN: Freq: Once | INTRAVENOUS | Status: DC

## 2015-12-31 NOTE — ED Provider Notes (Addendum)
[facility]-EMERGENCY DEPT Provider Note   CSN: [id number] Arrival date & time: 12/31/15  1801  By signing my name below, I, Linna Darner, attest that this documentation has been prepared under the direction and in the presence of Earley Favor, NP. Electronically Signed: Linna Darner, Scribe. 12/31/2015. 10:22 PM.  History   Chief Complaint Chief Complaint  Patient presents with  . Abdominal Pain  . Chest Pain    The history is provided by the patient. No language interpreter was used.    HPI Comments: CALLYN SEVERTSON is a 30 y.o. female with PMHx of asthma who presents to the Emergency Department complaining of sudden onset, intermittent, worsening, RUQ abdominal pain for the last several days. Pt reports this pain became constant today. Pt notes some chest pain and SOB beginning this morning; she reports she used her inhaler x1 this morning with good relief of these symptoms. She notes she felt febrile and measured a temperature of 100 a couple of days ago but this has since resolved. She notes some generalized myalgias since onset as well. Pt endorses abdominal pain exacerbation with certain positions but denies pain exacerbation with food intake. She states she has been taking ibuprofen for her abdominal pain with minimal relief; she has not taken any today. She denies h/o gallbladder problems. 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 Her LMP was 5 days ago and she states she is not pregnant. Pt denies cough or any other associated symptoms.  Past Medical History:  Diagnosis Date  . Allergy   . Asthma   . Hypertension     Patient Active Problem List   Diagnosis Date Noted  . PCP NOTES >>>>>>>>>>>>>>>>>>>>> 05/31/2015  . Asthma 05/11/2015  . Morbid obesity (HCC) 04/24/2015  . Elevated blood pressure 04/24/2015  . Late period 06/16/2013  . Unspecified symptom associated with female genital organs 10/12/2012  . Vaginitis and  vulvovaginitis, unspecified 10/12/2012    Past Surgical History:  Procedure Laterality Date  . DILATION AND CURETTAGE OF UTERUS    . TONSILLECTOMY AND ADENOIDECTOMY      OB History    Gravida Para Term Preterm AB Living   5 2 1 1 3 2    SAB TAB Ectopic Multiple Live Births   2   1   2        Home Medications    Prior to Admission medications   Medication Sig Start Date End Date Taking? Authorizing Provider  albuterol (PROVENTIL HFA;VENTOLIN HFA) 108 (90 Base) MCG/ACT inhaler Inhale 1-2 puffs into the lungs every 4 (four) hours as needed for wheezing or shortness of breath. 05/11/15  Yes Wanda Plump, MD  beclomethasone (QVAR) 80 MCG/ACT inhaler Inhale 2 puffs into the lungs 2 (two) times daily.   Yes Historical Provider, MD  cetirizine (ZYRTEC) 10 MG tablet Take 10 mg by mouth at bedtime.    Yes Historical Provider, MD  ibuprofen (ADVIL,MOTRIN) 800 MG tablet TAKE 1 TABLET (800 MG TOTAL) BY MOUTH EVERY 8 (EIGHT) HOURS AS NEEDED. Patient taking differently: TAKE 1 TABLET (800 MG TOTAL) BY MOUTH EVERY 8 (EIGHT) HOURS AS NEEDED FOR PAIN 10/24/15  Yes Brock Bad, MD  traMADol (ULTRAM) 50 MG tablet Take 1 tablet (50 mg total) by mouth every 6 (six) hours as needed for severe pain. 10/10/15  Yes Edward Saguier, PA-C  cyclobenzaprine (FLEXERIL) 10 MG tablet Take 1 tablet (10 mg total) by mouth 2 (two) times daily  as needed for muscle spasms. 12/31/15   Earley FavorGail Britiany Silbernagel, NP  diclofenac (VOLTAREN) 75 MG EC tablet Take 1 tablet (75 mg total) by mouth 2 (two) times daily. 12/31/15   Earley FavorGail Deretha Ertle, NP  Norgestimate-Ethinyl Estradiol Triphasic (ORTHO TRI-CYCLEN, 28,) 0.18/0.215/0.25 MG-35 MCG tablet Take 1 tablet by mouth daily. Patient not taking: Reported on 12/31/2015 10/10/15   Esperanza RichtersEdward Saguier, PA-C  nystatin-triamcinolone ointment [facility](MYCOLOG) Apply 1 application topically 2 (two) times daily. Patient not taking: Reported on 12/31/2015 07/25/15   Wanda PlumpJose E Paz, MD    Family History Family History  Problem  Relation Age of Onset  . Hyperlipidemia Father   . Diabetes Maternal Grandmother   . Heart disease Paternal Grandmother   . Hypertension Paternal Grandmother   . Colon cancer Neg Hx   . Breast cancer Neg Hx     Social History Social History  Substance Use Topics  . Smoking status: Never Smoker  . Smokeless tobacco: Never Used  . Alcohol use No     Allergies   Review of patient's allergies indicates no known allergies.   Review of Systems Review of Systems  Constitutional: Positive for fever (resolved).  Respiratory: Positive for shortness of breath. Negative for cough.   Cardiovascular: Positive for chest pain.  Gastrointestinal: Positive for abdominal pain (RUQ).  Musculoskeletal: Positive for myalgias (generalized).  All other systems reviewed and are negative.   Physical Exam Updated Vital Signs BP 126/76 (BP Location: Right Wrist)   Pulse 69   Temp 99.3 F (37.4 C) (Oral)   Resp 15   LMP 12/26/2015   SpO2 100%   Physical Exam  Constitutional: She appears well-developed and well-nourished.  HENT:  Head: Normocephalic.  Eyes: Pupils are equal, round, and reactive to light.  Neck: Normal range of motion.  Cardiovascular: Normal rate.   Abdominal: Soft. She exhibits no distension. There is tenderness. There is guarding.  Musculoskeletal: Normal range of motion.  Neurological: She is alert.  Skin: Skin is warm.     ED Treatments / Results  Labs (all labs ordered are listed, but only abnormal results are displayed) Labs Reviewed  CBC - Abnormal; Notable for the following:       Result Value   Hemoglobin 11.8 (*)    All other components within normal limits  HEPATIC FUNCTION PANEL - Abnormal; Notable for the following:    ALT 13 (*)    All other components within normal limits  BASIC METABOLIC PANEL  I-STAT TROPOININ, ED  I-STAT BETA HCG BLOOD, ED ([facility], [facility], [facility] ONLY)    EKG  EKG Interpretation  Date/Time:  Monday December 31 2015 18:33:01  EDT Ventricular Rate:  60 PR Interval:    QRS Duration: 102 QT Interval:  391 QTC Calculation: 391 R Axis:   60 Text Interpretation:  Sinus arrhythmia Confirmed by Freida BusmanALLEN  MD, ANTHONY ([id number]) on 12/31/2015 11:17:45 PM       Radiology Dg Chest 2 View  Result Date: 12/31/2015 CLINICAL DATA:  Acute onset of right lower quadrant abdominal pain and mild chest discomfort. Initial encounter. EXAM: CHEST  2 VIEW COMPARISON:  Chest radiograph performed 01/30/2015 FINDINGS: The lungs are well-aerated and clear. There is no evidence of focal opacification, pleural effusion or pneumothorax. The heart is normal in size; the mediastinal contour is within normal limits. No acute osseous abnormalities are seen. IMPRESSION: No acute cardiopulmonary process seen. Electronically Signed   By: Roanna RaiderJeffery  Chang M.D.   On: 12/31/2015 19:40    Procedures Procedures (including critical  care time)  DIAGNOSTIC STUDIES: Oxygen Saturation is 100% on RA, normal by my interpretation.    COORDINATION OF CARE: 10:30 PM Discussed treatment plan with pt at bedside and pt agreed to plan.  Medications Ordered in ED Medications  0.9 %  sodium chloride infusion (not administered)     Initial Impression / Assessment and Plan / ED Course  I have reviewed the triage vital signs and the nursing notes.  Pertinent labs & imaging results that were available during my care of the patient were reviewed by me and considered in my medical decision making (see chart for details).  Clinical Course  reviewed labs xray EKG UA all within normal parameters will start patient on antiinflammatory and muscle relaxer with return parameters   I personally performed the services described in this documentation, which was scribed in my presence. The recorded information has been reviewed and is accurate.  Final Clinical Impressions(s) / ED Diagnoses   Final diagnoses:  Right upper quadrant pain  Muscle ache    New Prescriptions New  Prescriptions   CYCLOBENZAPRINE (FLEXERIL) 10 MG TABLET    Take 1 tablet (10 mg total) by mouth 2 (two) times daily as needed for muscle spasms.     Earley Favor, NP 12/31/15 2350    Lorre Nick, MD 01/01/16 1610    Earley Favor, NP 02/25/16 2010    Lorre Nick, MD 02/27/16 1020

## 2015-12-31 NOTE — ED Notes (Signed)
This nurse attempted an IV x2.  Requested charge nurse come to attempt.

## 2015-12-31 NOTE — ED Triage Notes (Signed)
Patient states that she has been having intermittent RUQ pain over the past couple days but today while at work the pain became constant.   Patient also c/o central chest pain that started today. Patient states that she has asthma and thinks its related to that because had to use her neb treatment earlier.

## 2015-12-31 NOTE — Discharge Instructions (Signed)
As discussed your lab values, urine, xray ahd EKG are all normal  You will be tied on a short course of antiinflammatory and muscle relaxer.  If you see no difference or develop worse symptoms return to the ED or your PCP for further evaluation

## 2016-01-01 NOTE — ED Notes (Signed)
Pt ambulatory and independent at discharge.  Verbalized understanding of discharge instructions 

## 2016-01-30 ENCOUNTER — Ambulatory Visit: Payer: BLUE CROSS/BLUE SHIELD | Admitting: Certified Nurse Midwife

## 2016-02-13 ENCOUNTER — Encounter: Payer: Self-pay | Admitting: Internal Medicine

## 2016-02-13 ENCOUNTER — Encounter: Payer: BLUE CROSS/BLUE SHIELD | Admitting: Internal Medicine

## 2016-02-13 ENCOUNTER — Ambulatory Visit: Payer: BLUE CROSS/BLUE SHIELD | Admitting: Certified Nurse Midwife

## 2016-02-13 DIAGNOSIS — Z0289 Encounter for other administrative examinations: Secondary | ICD-10-CM

## 2016-02-22 ENCOUNTER — Encounter: Payer: Self-pay | Admitting: Internal Medicine

## 2016-02-22 ENCOUNTER — Ambulatory Visit (INDEPENDENT_AMBULATORY_CARE_PROVIDER_SITE_OTHER): Payer: BLUE CROSS/BLUE SHIELD | Admitting: Internal Medicine

## 2016-02-22 VITALS — BP 122/74 | HR 69 | Temp 98.5°F | Resp 14 | Ht 65.0 in | Wt 366.0 lb

## 2016-02-22 DIAGNOSIS — Z0000 Encounter for general adult medical examination without abnormal findings: Secondary | ICD-10-CM

## 2016-02-22 DIAGNOSIS — G4733 Obstructive sleep apnea (adult) (pediatric): Secondary | ICD-10-CM

## 2016-02-22 DIAGNOSIS — E049 Nontoxic goiter, unspecified: Secondary | ICD-10-CM

## 2016-02-22 DIAGNOSIS — R1011 Right upper quadrant pain: Secondary | ICD-10-CM

## 2016-02-22 DIAGNOSIS — R5383 Other fatigue: Secondary | ICD-10-CM

## 2016-02-22 DIAGNOSIS — Z23 Encounter for immunization: Secondary | ICD-10-CM

## 2016-02-22 DIAGNOSIS — E042 Nontoxic multinodular goiter: Secondary | ICD-10-CM

## 2016-02-22 DIAGNOSIS — R7303 Prediabetes: Secondary | ICD-10-CM

## 2016-02-22 LAB — CBC WITH DIFFERENTIAL/PLATELET
BASOS ABS: 85 {cells}/uL (ref 0–200)
BASOS PCT: 1 %
EOS ABS: 170 {cells}/uL (ref 15–500)
Eosinophils Relative: 2 %
HEMATOCRIT: 37.8 % (ref 35.0–45.0)
HEMOGLOBIN: 12.1 g/dL (ref 11.7–15.5)
LYMPHS ABS: 3230 {cells}/uL (ref 850–3900)
Lymphocytes Relative: 38 %
MCH: 27.3 pg (ref 27.0–33.0)
MCHC: 32 g/dL (ref 32.0–36.0)
MCV: 85.1 fL (ref 80.0–100.0)
MONO ABS: 850 {cells}/uL (ref 200–950)
MPV: 12.3 fL (ref 7.5–12.5)
Monocytes Relative: 10 %
NEUTROS ABS: 4165 {cells}/uL (ref 1500–7800)
Neutrophils Relative %: 49 %
PLATELETS: 226 10*3/uL (ref 140–400)
RBC: 4.44 MIL/uL (ref 3.80–5.10)
RDW: 14.9 % (ref 11.0–15.0)
WBC: 8.5 10*3/uL (ref 3.8–10.8)

## 2016-02-22 LAB — HEMOGLOBIN A1C
Hgb A1c MFr Bld: 5.4 % (ref <5.7)
MEAN PLASMA GLUCOSE: 108 mg/dL

## 2016-02-22 NOTE — Patient Instructions (Signed)
GO TO THE LAB : Get the blood work     GO TO THE FRONT DESK Schedule your next appointment for a  checkup in 6 months    Visit the website of the [facility] regards to their bariatric program  Calorie counting  QSYMIA, SAXENDA, BELVIQ

## 2016-02-22 NOTE — Progress Notes (Signed)
Subjective:    Patient ID: Monica Cantu, female    DOB: 08/29/1985, 30 y.o.   MRN: [id number]  DOS:  02/22/2016 Type of visit - description : CPX Interval history: Here for a physical exam, her main concern is her weight and lack of energy. She feels fatigued for long time, taking OTC vitamins without much help. Denies snoring.   Review of Systems Constitutional: No fever. occ chills at night  . No unexplained wt changes. No unusual sweats  HEENT: No dental problems, no ear discharge, no facial swelling, no voice changes. No eye discharge, no eye  redness , no  intolerance to light   Respiratory: No wheezing , no  difficulty breathing. No cough , no mucus production  Cardiovascular: No CP, no leg swelling , no  Palpitations  GI: no nausea, no vomiting, no diarrhea , no  abdominal pain.  No blood in the stools. No dysphagia, no odynophagia    Endocrine: No polyphagia, no polyuria , no polydipsia  GU: No dysuria, gross hematuria, difficulty urinating. No urinary urgency, no frequency.  Musculoskeletal: No joint swellings or unusual aches or pains  Skin: No change in the color of the skin, palor , no  Rash  Allergic, immunologic: No environmental allergies , no  food allergies  Neurological: No dizziness no  syncope. No headaches. No diplopia, no slurred, no slurred speech, no motor deficits, no facial  Numbness  Hematological: No enlarged lymph nodes, no easy bruising , no unusual bleedings  Psychiatry: No suicidal ideas, no hallucinations, no beavior problems, no confusion.  No unusual/severe anxiety, no depression   Past Medical History:  Diagnosis Date  . Allergy   . Asthma   . Hypertension     Past Surgical History:  Procedure Laterality Date  . DILATION AND CURETTAGE OF UTERUS    . TONSILLECTOMY AND ADENOIDECTOMY      Social History   Social History  . Marital status: Divorced    Spouse name: N/A  . Number of children: 2  . Years of education: N/A    Occupational History  . 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 Smoking status: Never Smoker  . Smokeless tobacco: Never Used  . Alcohol use No  . Drug use: No  . Sexual activity: Not Currently    Birth control/ protection: Abstinence   Other Topics Concern  . Not on file   Social History Narrative   G4P2  (2 miscarriages)    Household-- pt and 2 children     Family History  Problem Relation Age of Onset  . Hyperlipidemia Father   . Diabetes Maternal Grandmother   . Breast cancer Maternal Grandmother   . Heart disease Paternal Grandmother   . Hypertension Paternal Grandmother   . Colon cancer Neg Hx        Medication List       Accurate as of 02/22/16 11:59 PM. Always use your most recent med list.          albuterol 108 (90 Base) MCG/ACT inhaler Commonly known as:  PROVENTIL HFA;VENTOLIN HFA Inhale 1-2 puffs into the lungs every 4 (four) hours as needed for wheezing or shortness of breath.   beclomethasone 80 MCG/ACT inhaler Commonly known as:  QVAR Inhale 2 puffs into the lungs 2 (two) times daily.   cetirizine 10 MG tablet Commonly known as:  ZYRTEC Take 10 mg by mouth at bedtime.   cyclobenzaprine 10 MG tablet Commonly known  as:  FLEXERIL Take 1 tablet (10 mg total) by mouth 2 (two) times daily as needed for muscle spasms.   diclofenac 75 MG EC tablet Commonly known as:  VOLTAREN Take 1 tablet (75 mg total) by mouth 2 (two) times daily.   ibuprofen 800 MG tablet Commonly known as:  ADVIL,MOTRIN TAKE 1 TABLET (800 MG TOTAL) BY MOUTH EVERY 8 (EIGHT) HOURS AS NEEDED.   traMADol 50 MG tablet Commonly known as:  ULTRAM Take 1 tablet (50 mg total) by mouth every 6 (six) hours as needed for severe pain.          Objective:   Physical Exam BP 122/74 (BP Location: Left Wrist, Patient Position: Sitting, Cuff Size: Small)   Pulse 69   Temp 98.5 F (36.9 C) (Oral)   Resp 14   Ht 5\' 5"  (1.651 m)   Wt (!) 366 lb (166  kg)   LMP 02/16/2016   BMI 60.91 kg/m   General:   Well developed,Overweight appearing, no distress. Neck: Slightly increase thyroid upon palpation, worse on the right, not nodular or tender  HEENT:  Normocephalic . Face symmetric, atraumatic Lungs:  CTA B Normal respiratory effort, no intercostal retractions, no accessory muscle use. Heart: RRR,  no murmur.  No pretibial edema bilaterally  Abdomen:  Not distended, soft, non-tender. No rebound or rigidity.   Skin: Exposed areas without rash. Not pale. Not jaundice Neurologic:  alert & oriented X3.  Speech normal, gait appropriate for age and unassisted Strength symmetric and appropriate for age.  Psych: Cognition and judgment appear intact.  Cooperative with normal attention span and concentration.  Behavior appropriate. No anxious or depressed appearing.    Assessment & Plan:  Assessment Prediabetes: A1c 6.0 05-2015 Elevated BP w/o dx of HTN Asthma; Allergies Morbid obesity H/o fibroids   PLAN:  Prediabetes: Diet discussed, see below. Check the A1c  Morbid obesity: Long history of obesity, has tried a number of medications from a "bariatric clinic" (name of meds?) before, wonders about surgery.  While I'll give him information about the bariatric surgery center I encouraged her to do one more serious effort to lose weight, it should include calorie counting. She may also consider medication such as SAXENDA, Qsymia, etc See AVS. Exercise is limited due to knee pain. Lack of energy: Epworht scale is 12, it is positive, refer for sleep study although weight loss would be probably curative. For completeness we'll check a B12 and folic acid Thyromegaly: See physical exam, TSH slightly low a few months ago, re-check TFTs and a thyroid ultrasound  RUQ abd pain: Also went to the ER w/ RUQ abd pain, no further sxs, GB colic?  get a US  RTC  6 months  In addition to her physical exam, I spent more than 20 minutes assessing a new  finding no thyromegaly, reviewing the ER notes and follow-up on the right upper quadrant abdominal pain

## 2016-02-22 NOTE — Progress Notes (Signed)
Pre visit review using our clinic review tool, if applicable. No additional management support is needed unless otherwise documented below in the visit note. 

## 2016-02-22 NOTE — Assessment & Plan Note (Signed)
Tdap today, flu shot today. Female care-- PAP ~ 07-2015 interested in better control, recommend to discuss with female provided  CCS: no indicated  Extensive discussion about diet Labs: CBC, A1c, FLP, TFTs, B12, folic acid, TB test

## 2016-02-23 LAB — LIPID PANEL
CHOL/HDL RATIO: 3.5 ratio (ref <5.0)
CHOLESTEROL: 120 mg/dL (ref <200)
HDL: 34 mg/dL — ABNORMAL LOW (ref >50)
LDL Cholesterol: 67 mg/dL (ref <100)
TRIGLYCERIDES: 94 mg/dL (ref <150)
VLDL: 19 mg/dL (ref <30)

## 2016-02-23 LAB — FOLATE: Folate: 8 ng/mL (ref >5.4)

## 2016-02-23 LAB — VITAMIN B12: Vitamin B-12: 452 pg/mL (ref 200–1100)

## 2016-02-23 LAB — T4, FREE: FREE T4: 1.2 ng/dL (ref 0.8–1.8)

## 2016-02-23 LAB — T3, FREE: T3, Free: 2.7 pg/mL (ref 2.3–4.2)

## 2016-02-23 LAB — TSH: TSH: 0.51 m[IU]/L

## 2016-02-24 NOTE — Assessment & Plan Note (Signed)
Prediabetes: Diet discussed, see below. Check the A1c  Morbid obesity: Long history of obesity, has tried a number of medications from a "bariatric clinic" (name of meds?) before, wonders about surgery.  While I'll give him information about the bariatric surgery center I encouraged her to do one more serious effort to lose weight, it should include calorie counting. She may also consider medication such as SAXENDA, Qsymia, etc See AVS. Exercise is limited due to knee pain. Lack of energy: Epworht scale is 12, it is positive, refer for sleep study although weight loss would be probably curative. For completeness we'll check a B12 and folic acid Thyromegaly: See physical exam, TSH slightly low a few months ago, re-check TFTs and a thyroid ultrasound  RUQ abd pain: Also went to the ER w/ RUQ abd pain, no further sxs, GB colic?  get a US  RTC  6 months

## 2016-02-25 LAB — QUANTIFERON TB GOLD ASSAY (BLOOD)
INTERFERON GAMMA RELEASE ASSAY: NEGATIVE
Mitogen-Nil: >10 IU/mL
QUANTIFERON TB AG MINUS NIL: 0.03 [IU]/mL
Quantiferon Nil Value: 0.02 IU/mL

## 2016-02-26 ENCOUNTER — Telehealth: Payer: Self-pay

## 2016-02-26 NOTE — Telephone Encounter (Signed)
Received faxed form from Pt. Form completed and faxed to Pt at [phone] w/ TB Gold results. Form sent for scanning.

## 2016-03-03 ENCOUNTER — Ambulatory Visit (INDEPENDENT_AMBULATORY_CARE_PROVIDER_SITE_OTHER): Payer: Medicaid Other | Admitting: Certified Nurse Midwife

## 2016-03-03 ENCOUNTER — Encounter: Payer: Self-pay | Admitting: Certified Nurse Midwife

## 2016-03-03 VITALS — BP 133/86 | HR 99 | Temp 98.1°F | Ht 65.0 in | Wt 361.0 lb

## 2016-03-03 DIAGNOSIS — Z30015 Encounter for initial prescription of vaginal ring hormonal contraceptive: Secondary | ICD-10-CM

## 2016-03-03 MED ORDER — ETONOGESTREL-ETHINYL ESTRADIOL 0.12-0.015 MG/24HR VA RING: VAGINAL_RING | VAGINAL | 4 refills | Status: DC

## 2016-03-03 NOTE — Progress Notes (Signed)
Patient is in the office to discuss birth control options.

## 2016-03-08 ENCOUNTER — Ambulatory Visit (HOSPITAL_BASED_OUTPATIENT_CLINIC_OR_DEPARTMENT_OTHER): Payer: Medicaid Other

## 2016-03-18 ENCOUNTER — Telehealth: Payer: Self-pay | Admitting: Internal Medicine

## 2016-03-18 NOTE — Telephone Encounter (Signed)
Please advise 

## 2016-03-18 NOTE — Telephone Encounter (Signed)
She could if needed.

## 2016-03-18 NOTE — Telephone Encounter (Signed)
Patient is calling to find out if she can take Prednisone,  traMADol (ULTRAM) 50 MG tablet, and ibuprofen together? Please advise.   Patient phone: [phone]

## 2016-03-19 NOTE — Telephone Encounter (Signed)
Spoke w/ Pt, informed her of PCP response. Pt verbalized understanding.

## 2016-04-03 ENCOUNTER — Institutional Professional Consult (permissible substitution): Payer: BLUE CROSS/BLUE SHIELD | Admitting: Neurology

## 2016-04-12 ENCOUNTER — Ambulatory Visit (HOSPITAL_BASED_OUTPATIENT_CLINIC_OR_DEPARTMENT_OTHER)
Admission: RE | Admit: 2016-04-12 | Discharge: 2016-04-12 | Disposition: A | Discharge status: Home / Self Care | Payer: BLUE CROSS/BLUE SHIELD | Source: Ambulatory Visit | Attending: Internal Medicine | Admitting: Internal Medicine

## 2016-04-12 ENCOUNTER — Ambulatory Visit (HOSPITAL_BASED_OUTPATIENT_CLINIC_OR_DEPARTMENT_OTHER): Payer: BLUE CROSS/BLUE SHIELD

## 2016-04-12 ENCOUNTER — Ambulatory Visit (HOSPITAL_BASED_OUTPATIENT_CLINIC_OR_DEPARTMENT_OTHER)
Admission: RE | Admit: 2016-04-12 | Discharge: 2016-04-12 | Disposition: A | Discharge status: Home / Self Care | Payer: BLUE CROSS/BLUE SHIELD | Source: Ambulatory Visit | Attending: Certified Nurse Midwife | Admitting: Certified Nurse Midwife

## 2016-04-12 DIAGNOSIS — R932 Abnormal findings on diagnostic imaging of liver and biliary tract: Secondary | ICD-10-CM | POA: Diagnosis not present

## 2016-04-12 DIAGNOSIS — N939 Abnormal uterine and vaginal bleeding, unspecified: Secondary | ICD-10-CM

## 2016-04-12 DIAGNOSIS — E042 Nontoxic multinodular goiter: Secondary | ICD-10-CM | POA: Diagnosis not present

## 2016-04-12 DIAGNOSIS — E049 Nontoxic goiter, unspecified: Secondary | ICD-10-CM | POA: Diagnosis present

## 2016-04-15 NOTE — Addendum Note (Signed)
Addended byConrad Naturita: Nicci Vaughan D on: 04/15/2016 09:02 AM   Modules accepted: Orders

## 2016-04-17 ENCOUNTER — Encounter: Payer: Self-pay | Admitting: Internal Medicine

## 2016-04-17 ENCOUNTER — Ambulatory Visit (INDEPENDENT_AMBULATORY_CARE_PROVIDER_SITE_OTHER): Payer: BLUE CROSS/BLUE SHIELD | Admitting: Internal Medicine

## 2016-04-17 VITALS — BP 126/74 | HR 101 | Temp 98.7°F | Resp 14 | Ht 65.0 in | Wt 372.0 lb

## 2016-04-17 DIAGNOSIS — J069 Acute upper respiratory infection, unspecified: Secondary | ICD-10-CM

## 2016-04-17 DIAGNOSIS — B9789 Other viral agents as the cause of diseases classified elsewhere: Secondary | ICD-10-CM

## 2016-04-17 DIAGNOSIS — J45901 Unspecified asthma with (acute) exacerbation: Secondary | ICD-10-CM

## 2016-04-17 DIAGNOSIS — M545 Low back pain: Secondary | ICD-10-CM | POA: Diagnosis not present

## 2016-04-17 MED ORDER — PREDNISONE 10 MG PO TABS: ORAL_TABLET | ORAL | 0 refills | Status: DC

## 2016-04-17 NOTE — Progress Notes (Signed)
Subjective:    Patient ID: Monica Cantu, female    DOB: February 01, 1986, 31 y.o.   MRN: [id number]  DOS:  04/17/2016 Type of visit - description :Acute visit,  2 acute problems. Interval history: Long  history of on and off back pain, worse over the last year and particularly for the last 2 weeks. Typically the pain is triggered by laying down in bed in certain positions, it is located at the left hip and radiates down to her foot through out the posterior aspect of the leg. Saw a chiropractor, it did'n help. Symptoms last ~15 to 50 minutes. They are associated with some numbness @ the lower extremity. When asked, admits  that sometimes the left leg feels weak.  Also, having a URI for 2 weeks: Sinus pain and congestion, increase wheezing, using albuterol more often. Has taken OTC TheraFlu without much improvement.   Review of Systems  No fever chills Denies any acute back injury or rash at the lower extremities No bladder or bowel incontinence  Denies chest pain, nausea vomiting or myalgias.  LMP 04/11/2016, still going on  Past Medical History:  Diagnosis Date  . Allergy   . Asthma   . Hypertension     Past Surgical History:  Procedure Laterality Date  . DILATION AND CURETTAGE OF UTERUS    . TONSILLECTOMY AND ADENOIDECTOMY      Social History   Social History  . Marital status: Divorced    Spouse name: N/A  . Number of children: 2  . Years of education: N/A   Occupational History  . 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 Smoking status: Never Smoker  . Smokeless tobacco: Never Used  . Alcohol use No  . Drug use: No  . Sexual activity: Yes    Birth control/ protection: Condom   Other Topics Concern  . Not on file   Social History Narrative   G4P2  (2 miscarriages)    Household-- pt and 2 children      Allergies as of 04/17/2016   No Known Allergies     Medication List       Accurate as of 04/17/16  4:23 PM. Always use  your most recent med list.          albuterol 108 (90 Base) MCG/ACT inhaler Commonly known as:  PROVENTIL HFA;VENTOLIN HFA Inhale 1-2 puffs into the lungs every 4 (four) hours as needed for wheezing or shortness of breath.   beclomethasone 80 MCG/ACT inhaler Commonly known as:  QVAR Inhale 2 puffs into the lungs 2 (two) times daily.   cetirizine 10 MG tablet Commonly known as:  ZYRTEC Take 10 mg by mouth at bedtime.   predniSONE 10 MG tablet Commonly known as:  DELTASONE 4 tablets x 2 days, 3 tabs x 2 days, 2 tabs x 2 days, 1 tab x 2 days   traMADol 50 MG tablet Commonly known as:  ULTRAM Take 1 tablet (50 mg total) by mouth every 6 (six) hours as needed for severe pain.          Objective:   Physical Exam BP 126/74 (BP Location: Left Wrist, Patient Position: Sitting, Cuff Size: Normal)   Pulse (!) 101   Temp 98.7 F (37.1 C) (Oral)   Resp 14   Ht 5\' 5"  (1.651 m)   Wt (!) 372 lb (168.7 kg)   LMP 04/11/2016   SpO2 96%   BMI 61.90 kg/m  General:  Well developed, well nourished . NAD.  HEENT:  Normocephalic . Face symmetric, atraumatic. TMs obscured by wax, nose is slightly congested, sinuses no TTP. Lungs:   Few rhonchi, slightly increased expiratory time, no actual wheezing Normal respiratory effort, no intercostal retractions, no accessory muscle use. Heart: RRR,  no murmur.  No pretibial edema bilaterally  Skin: Not pale. Not jaundice Back: No TTP Neurologic:  alert & oriented X3.  Speech normal, gait not antalgic, she did experience some pain when she laid down on  the examining table DTRs symmetric (decreased in both knees). Straight leg test negative. Muscle strength symmetric Psych--  Cognition and judgment appear intact.  Cooperative with normal attention span and concentration.  Behavior appropriate. No anxious or depressed appearing.      Assessment & Plan:   Assessment Prediabetes: A1c 6.0 05-2015 Elevated BP w/o dx of  HTN Thyromegaly-nodules (see us 04-2015) Asthma; Allergies Morbid obesity H/o fibroids  Not on BCP as off 04-2015   PLAN:  Lumbalgia: As described above, some radicular features. I rec self PT,  prednisone, use Tylenol Motrin OTC. If no better she will call for either formal PT or refer to orthopedic. URI with mild asthma exacerbation: prescribing prednisone for her back pain, should help asthma as well . For now recommend to continue Qvar, albuterol and take Mucinex regularly. Will call for an abx if not improving soon. Morbid obesity: Has a pending appointment to see the bariatric clinic Thyromegaly: TSH was normal, ultrasound confirmed thyromegaly and nodules, will see endocrinology. RTC by 08/2016

## 2016-04-17 NOTE — Assessment & Plan Note (Signed)
PLAN:  Lumbalgia: As described above, some radicular features. I rec self PT,  prednisone, use Tylenol Motrin OTC. If no better she will call for either formal PT or refer to orthopedic. URI with mild asthma exacerbation: prescribing prednisone for her back pain, should help asthma as well . For now recommend to continue Qvar, albuterol and take Mucinex regularly. Will call for an abx if not improving soon. Morbid obesity: Has a pending appointment to see the bariatric clinic Thyromegaly: TSH was normal, ultrasound confirmed thyromegaly and nodules, will see endocrinology. RTC by 08/2016

## 2016-04-17 NOTE — Progress Notes (Signed)
Pre visit review using our clinic review tool, if applicable. No additional management support is needed unless otherwise documented below in the visit note. 

## 2016-04-17 NOTE — Patient Instructions (Signed)
For back pain: -Do  your stretching twice a day, please see the videos  from the [facility] website -Prednisone as prescribed -Continue the ibuprofen or Tylenol as needed. See instructions below -IBUPROFEN (Advil or Motrin) 200 mg 2 tablets every 6 hours as needed for pain.  Always take it with food because may cause gastritis and ulcers.  If you notice nausea, stomach pain, change in the color of stools --->  Stop the medicine and let us know -Tylenol  500 mg OTC 2 tabs a day every 8 hours as needed for pain Call if not gradually improving  You  have a cold: -Take Mucinex DM twice a day Continue using Qvar daily and albuterol as needed -Call next week if not much improved, you may need antibiotics   ==== [facility] Website with information about home physical therapy for back pain: XULive.[email or web address]-[email or web address]-[email or web address]

## 2016-04-21 ENCOUNTER — Telehealth: Payer: Self-pay

## 2016-04-21 ENCOUNTER — Institutional Professional Consult (permissible substitution): Payer: BLUE CROSS/BLUE SHIELD | Admitting: Neurology

## 2016-04-21 NOTE — Telephone Encounter (Signed)
Pt did not show for their appt with Dr. Dohmeier today.  

## 2016-04-22 ENCOUNTER — Encounter: Payer: Self-pay | Admitting: Neurology

## 2016-04-23 ENCOUNTER — Encounter: Payer: Self-pay | Admitting: Internal Medicine

## 2016-04-25 ENCOUNTER — Other Ambulatory Visit: Payer: Self-pay | Admitting: Internal Medicine

## 2016-04-25 MED ORDER — PREDNISONE 10 MG PO TABS: ORAL_TABLET | ORAL | 0 refills | Status: DC

## 2016-04-25 MED ORDER — AZITHROMYCIN 250 MG PO TABS: ORAL_TABLET | ORAL | 0 refills | Status: DC

## 2016-05-22 ENCOUNTER — Other Ambulatory Visit: Payer: Self-pay | Admitting: Internal Medicine

## 2016-05-27 ENCOUNTER — Other Ambulatory Visit: Payer: Self-pay | Admitting: Internal Medicine

## 2016-05-27 ENCOUNTER — Encounter: Payer: Self-pay | Admitting: Internal Medicine

## 2016-05-27 ENCOUNTER — Ambulatory Visit (INDEPENDENT_AMBULATORY_CARE_PROVIDER_SITE_OTHER): Payer: BLUE CROSS/BLUE SHIELD | Admitting: Internal Medicine

## 2016-05-27 VITALS — BP 124/82 | HR 69 | Ht 66.0 in | Wt 375.0 lb

## 2016-05-27 DIAGNOSIS — E042 Nontoxic multinodular goiter: Secondary | ICD-10-CM

## 2016-05-27 NOTE — Progress Notes (Signed)
Patient ID: Monica Cantu, female   DOB: [birth year], 31 y.o.   MRN: [id number]    HPI  Monica Cantu is a 31 y.o.-year-old female, referred by her PCP, Dr. Drue Novel, for evaluation for multiple thyroid nodules.  I reviewed pt's thyroid U/S report: Thyroid U/S (04/12/2016): - several nodules, of which: 1. Isthmic nodule: 4 x 2.6 x 2.7 cm, isoechoic 2. L lobe nodule: 2.1 x 1.8 x 2.0 cm, hyperechoic 3. L lobe nodule: 0.9 x 1.3 x 1.6 cm, hyperechoic 4. L lobe nodule: 4.6 x 3.4 x 4.7 cm, hyperechoic R lobe with only small cysts  Pt denies: - feeling nodules in neck - dysphagia - choking But she feels food being stuck in throat. Occasional SOB with lying down. 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  I reviewed pt's thyroid tests: Lab Results  Component Value Date   TSH 0.51 02/22/2016   TSH 0.315 (L) 05/11/2015   FREET4 1.2 02/22/2016   FREET4 1.5 05/11/2015    Pt denies: - fatigue - heat intolerance/cold intolerance - tremors - palpitations - anxiety/depression - hyperdefecation/constipation - weight loss/weight gain - dry skin - hair loss  + FH of thyroid ds in MGM >> goiter. No FH of thyroid cancer. No h/o radiation tx to head or neck.  No seaweed or kelp. No recent contrast studies. No steroid use recently. No herbal supplements. No Biotin supplements or Hair, Skin and Nails vitamins.  Pt also has a history of asthma - worse in the winter. Had this since being a child.  ROS: Constitutional: no weight gain/loss, no fatigue, no subjective hyperthermia/hypothermia Eyes: no blurry vision, no xerophthalmia ENT: no sore throat, + see HPI Cardiovascular: no CP/SOB/palpitations/leg swelling Respiratory: no cough/SOB Gastrointestinal: no N/V/D/C/+ acid reflux Musculoskeletal: no muscle/joint aches Skin: no rashes Neurological: no  tremors/numbness/tingling/dizziness Psychiatric: no depression/anxiety  Past Medical History:  Diagnosis Date  . Allergy   . Asthma   . Hypertension    Past Surgical History:  Procedure Laterality Date  . DILATION AND CURETTAGE OF UTERUS    . TONSILLECTOMY AND ADENOIDECTOMY     Social History   Social History  . Marital status: Divorced    Spouse name: N/A  . Number of children: 2   Occupational History  . 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 Smoking status: Never Smoker  . Smokeless tobacco: Never Used  . Alcohol use No  . Drug use: No   Social History Narrative   G4P2  (2 miscarriages)    Household-- pt and 2 children   Current Outpatient Prescriptions on File Prior to Visit  Medication Sig Dispense Refill  . beclomethasone (QVAR) 80 MCG/ACT inhaler Inhale 2 puffs into the lungs daily. 8.7 g 5  . cetirizine (ZYRTEC) 10 MG tablet Take 10 mg by mouth at bedtime.     . predniSONE (DELTASONE) 10 MG tablet 2 tabs a day x 5 days 10 tablet 0  . traMADol (ULTRAM) 50 MG tablet Take 1 tablet (50 mg total) by mouth every 6 (six) hours as needed for severe pain. 16 tablet 0   No current facility-administered medications on file prior to visit.    No Known Allergies Family History  Problem Relation Age of Onset  . Hyperlipidemia Father   . Diabetes Maternal Grandmother   . Breast  cancer Maternal Grandmother   . Heart disease Paternal Grandmother   . Hypertension Paternal Grandmother   . Colon cancer Neg Hx    PE: BP 124/82 (BP Location: Left Arm, Patient Position: Sitting)   Pulse 69   Ht 5\' 6"  (1.676 m)   Wt (!) 375 lb (170.1 kg)   LMP 05/14/2016   SpO2 98%   BMI 60.53 kg/m  Wt Readings from Last 3 Encounters:  05/27/16 (!) 375 lb (170.1 kg)  04/17/16 (!) 372 lb (168.7 kg)  03/03/16 (!) 361 lb (163.7 kg)    Constitutional: overweight, in NAD Eyes: PERRLA, EOMI, no exophthalmos ENT: moist mucous membranes,  no thyromegaly, no cervical lymphadenopathy Cardiovascular: RRR, No MRG Respiratory: CTA B Gastrointestinal: abdomen soft, NT, ND, BS+ Musculoskeletal: no deformities, strength intact in all 4;  Skin: moist, warm, no rashes Neurological: no tremor with outstretched hands, DTR normal in all 4  ASSESSMENT: 1. Multiple thyroid nodules  PLAN: 1. Multiple thyroid nodules - I reviewed the images of her thyroid ultrasound along with the patient. I pointed out that the dominant nodules are large, this being a risk factor for cancer.  Otherwise, the nodules are: - not hypoechoic - without microcalcifications except 1 of the L nodules - without internal blood flow - more wide than tall - well delimited from surrounding tissue Pt does not have a thyroid cancer family history or a personal history of RxTx to head/neck. All these would favor benignity.  - the only way that we can tell exactly if it is cancer or not is by doing a thyroid biopsy (FNA). I suggested to biopsy the largest nodules, one in the isthmus and one in the left lobe. I explained what the test entails.  - patient decided to have the FNA done now >> I ordered this.  - I explained that this is not cancer, we can continue to follow her on a yearly basis, and check another ultrasound in another year or 2. - she should let me know if she develops neck compression symptoms, in that case, we might need to do either lobectomy or thyroidectomy. 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 - I reviewed her most recent TFTs which are normal. I would not repeat them today. - I'll see her back in a year, assuming her FNA is normal. If FNA abnormal, we will meet sooner.   Orders Placed This Encounter  Procedures  . US Thyroid Biopsy    Answer:   Isthmic nodule: 4 x 2.6 x 2.7 cm    Answer:    [facility]  . US Thyroid Biopsy    Answer:   L lobe nodule: 4.6 x 3.4 x 4.7 cm    Answer:   [facility]   Adequacy Reason Satisfactory For Evaluation. Diagnosis THYROID, FINE NEEDLE ASPIRATION LEFT LOBE, LEFT LOWER POLE (SPECIMEN 1 OF 2 COLLECTED 06/18/2016) CONSISTENT WITH BENIGN FOLLICULAR NODULE (BETHESDA CATEGORY II). Jimmy PicketJOHN PATRICK MD Pathologist, Electronic Signature (Case signed 06/19/2016) Specimen Clinical Information Nodule 3 Left Inferior 4.6 x 3.4 x 4.7 cm, Hyperechoic, ACR TI-RADS total points:3, Mildly suspicious nodule Source Thyroid, Fine Needle Aspiration, [facility], (Specimen 1 of 2, collected on 06/18/16 )   Adequacy Reason Satisfactory For Evaluation. Diagnosis THYROID, FINE NEEDLE ASPIRATION ISTHMUS (SPECIMEN 2 OF 2 COLLECTED  06/18/2016) CONSISTENT WITH BENIGN FOLLICULAR NODULE (BETHESDA CATEGORY II). Jimmy Picket MD Pathologist, Electronic Signature (Case signed 06/19/2016) Specimen Clinical Information Nodule 1 Isthmus Inferior 4.0 x 2.6 x 2.7 cm solid / almost completely solid, Isoechoic, ACR TI-RADS total points:4, Moderately suspicious nodule Source  Thyroid, Fine Needle Aspiration, Isthmus, (Specimen 2 of 2, collected on 06/18/16 )  Benign nodules.  Carlus Pavlov, MD PhD [facility] Endocrinology

## 2016-05-27 NOTE — Patient Instructions (Addendum)
We will schedule the thyroid biopsies and let you know about the schedule.  Please return in 1 year.   Thyroid Biopsy The thyroid gland is a butterfly-shaped gland located in the front of the neck. It produces hormones that affect metabolism, growth and development, and body temperature. Thyroid biopsy is a procedure in which small samples of tissue or fluid are removed from the thyroid gland. The samples are then looked at under a microscope to check for abnormalities. This procedure is done to determine the cause of thyroid problems. It may be done to check for infection, cancer, or other thyroid problems. Two methods may be used for a thyroid biopsy. In one method, a thin needle is inserted through the skin and into the thyroid gland. In the other method, an open incision is made through the skin. Tell a health care provider about:  Any allergies you have.  All medicines you are taking, including vitamins, herbs, eye drops, creams, and over-the-counter medicines.  Any problems you or family members have had with anesthetic medicines.  Any blood disorders you have.  Any surgeries you have had.  Any medical conditions you have. What are the risks? Generally, this is a safe procedure. However, problems can occur and include:  Bleeding from the procedure site.  Infection.  Injury to structures near the thyroid gland. What happens before the procedure?  Ask your health care provider about:  Changing or stopping your regular medicines. This is especially important if you are taking diabetes medicines or blood thinners.  Taking medicines such as aspirin and ibuprofen. These medicines can thin your blood. Do not take these medicines before your procedure if your health care provider asks you not to.  Do not eat or drink anything after midnight on the night before the procedure or as directed by your health care provider.  You may have a blood sample taken. What happens during the  procedure? Either of these methods may be used to perform a thyroid biopsy:  Fine needle biopsy. You may be given medicine to help you relax (sedative). You will be asked to lie on your back with your head tipped backward to extend your neck. An area on your neck will be cleaned. A needle will then be inserted through the skin of your neck. You may be asked to avoid coughing, talking, swallowing, or making sounds during some portions of the procedure. The needle will be withdrawn once the tissue or fluid samples have been removed. Pressure may be applied to your neck to reduce swelling and ensure that bleeding has stopped. The samples will be sent to a lab for examination.  Open biopsy. You will be given medicine to make you sleep (general anesthetic). An incision will be made in your neck. A sample of thyroid tissue will be removed using surgical tools. The tissue sample will be sent for examination. In some cases, the sample may be examined during the biopsy. If that is done and cancer cells are found, some or all of the thyroid gland may be removed. The incision will be closed with stitches. What happens after the procedure?  Your recovery will be assessed and monitored.  You may have soreness and tenderness at the site of the biopsy. This should go away after a few days.  If you had an open biopsy, you may have a hoarse voice or sore throat for a couple days.  It is your responsibility to get your test results. This information is not intended to  replace advice given to you by your health care provider. Make sure you discuss any questions you have with your health care provider. Document Released: 01/19/2007 Document Revised: 11/25/2015 Document Reviewed: 06/16/2013 Elsevier Interactive Patient Education  2017 [employer].

## 2016-06-18 ENCOUNTER — Ambulatory Visit
Admission: RE | Admit: 2016-06-18 | Discharge: 2016-06-18 | Disposition: A | Discharge status: Home / Self Care | Payer: BLUE CROSS/BLUE SHIELD | Source: Ambulatory Visit | Attending: Internal Medicine | Admitting: Internal Medicine

## 2016-06-18 ENCOUNTER — Telehealth: Payer: Self-pay | Admitting: Internal Medicine

## 2016-06-18 ENCOUNTER — Other Ambulatory Visit (HOSPITAL_COMMUNITY)
Admission: RE | Admit: 2016-06-18 | Discharge: 2016-06-18 | Disposition: A | Discharge status: Home / Self Care | Payer: BLUE CROSS/BLUE SHIELD | Source: Ambulatory Visit | Attending: Radiology | Admitting: Radiology

## 2016-06-18 ENCOUNTER — Telehealth: Payer: Self-pay

## 2016-06-18 DIAGNOSIS — E042 Nontoxic multinodular goiter: Secondary | ICD-10-CM | POA: Diagnosis not present

## 2016-06-18 MED ORDER — DIAZEPAM 2 MG PO TABS: 1.0000 mg | ORAL_TABLET | Freq: Once | ORAL | 0 refills | Status: AC

## 2016-06-18 NOTE — Telephone Encounter (Signed)
Okay valium 0.5 mg one tablet preprocedure #1, no refills. Needs a driver because she may get very sleepy. She is not on birth control to my knowledge, cannot take Valium if pregnant. If any questions, she could come to the office for a UPT

## 2016-06-18 NOTE — Telephone Encounter (Signed)
Valium not available in 0.5mg  strength. Per PCP, okay Valium 2mg  take 1/2 tablet once before procedure, #1 and 0RF. Rx printed, signed [facility]. Per Pt's chart birth control is Nuvaring. LMOM informing Pt of medication instructions and driving instructions. Informed her to call if questions/concerns.

## 2016-06-18 NOTE — Addendum Note (Signed)
Addended byConrad Corinne: Keyairra Kolinski D on: 06/18/2016 01:46 PM   Modules accepted: Orders

## 2016-06-18 NOTE — Telephone Encounter (Signed)
Error

## 2016-06-18 NOTE — Telephone Encounter (Addendum)
°  Relation to ZO:XWRUpt:self  Call back number: [phone]  [id number] [place] - [address] [phone] (Phone) [phone] (Fax)     Reason for call:  Patient requesting valium,prior to her BX appointment scheduled for 3pm today, please advise

## 2016-06-18 NOTE — Telephone Encounter (Signed)
Called and advised that it was okay to take valium, notify PCP and see if they can prescribe it. Patient understood.

## 2016-06-18 NOTE — Telephone Encounter (Signed)
Please advise 

## 2016-06-23 ENCOUNTER — Other Ambulatory Visit (HOSPITAL_COMMUNITY): Payer: Self-pay | Admitting: Surgery

## 2016-07-01 ENCOUNTER — Ambulatory Visit (HOSPITAL_COMMUNITY)
Admission: RE | Admit: 2016-07-01 | Discharge: 2016-07-01 | Disposition: A | Discharge status: Home / Self Care | Payer: BLUE CROSS/BLUE SHIELD | Source: Ambulatory Visit | Attending: Surgery | Admitting: Surgery

## 2016-07-01 ENCOUNTER — Other Ambulatory Visit: Payer: Self-pay

## 2016-07-02 ENCOUNTER — Encounter: Payer: Self-pay | Admitting: Medical

## 2016-07-02 ENCOUNTER — Ambulatory Visit (HOSPITAL_BASED_OUTPATIENT_CLINIC_OR_DEPARTMENT_OTHER)
Admission: RE | Admit: 2016-07-02 | Discharge: 2016-07-02 | Disposition: A | Discharge status: Home / Self Care | Payer: BLUE CROSS/BLUE SHIELD | Source: Ambulatory Visit | Attending: Medical | Admitting: Medical

## 2016-07-02 ENCOUNTER — Ambulatory Visit (INDEPENDENT_AMBULATORY_CARE_PROVIDER_SITE_OTHER): Payer: BLUE CROSS/BLUE SHIELD | Admitting: Medical

## 2016-07-02 ENCOUNTER — Telehealth: Payer: Self-pay | Admitting: Medical

## 2016-07-02 VITALS — BP 124/92 | HR 82 | Temp 98.1°F | Resp 16 | Ht 66.0 in | Wt 365.0 lb

## 2016-07-02 DIAGNOSIS — M5136 Other intervertebral disc degeneration, lumbar region: Secondary | ICD-10-CM | POA: Diagnosis not present

## 2016-07-02 DIAGNOSIS — M79662 Pain in left lower leg: Secondary | ICD-10-CM | POA: Insufficient documentation

## 2016-07-02 DIAGNOSIS — M5442 Lumbago with sciatica, left side: Secondary | ICD-10-CM

## 2016-07-02 DIAGNOSIS — R252 Cramp and spasm: Secondary | ICD-10-CM

## 2016-07-02 LAB — COMPREHENSIVE METABOLIC PANEL
ALT: 10 U/L (ref 0–35)
AST: 13 U/L (ref 0–37)
Albumin: 4.1 g/dL (ref 3.5–5.2)
Alkaline Phosphatase: 53 U/L (ref 39–117)
BUN: 14 mg/dL (ref 6–23)
CO2: 26 meq/L (ref 19–32)
Calcium: 9.4 mg/dL (ref 8.4–10.5)
Chloride: 107 mEq/L (ref 96–112)
Creatinine, Ser: 0.89 mg/dL (ref 0.40–1.20)
GFR: 93.81 mL/min (ref >60.00)
GLUCOSE: 91 mg/dL (ref 70–99)
POTASSIUM: 4.4 meq/L (ref 3.5–5.1)
Sodium: 138 mEq/L (ref 135–145)
Total Bilirubin: 0.3 mg/dL (ref 0.2–1.2)
Total Protein: 7.6 g/dL (ref 6.0–8.3)

## 2016-07-02 LAB — MAGNESIUM: Magnesium: 2.2 mg/dL (ref 1.5–2.5)

## 2016-07-02 MED ORDER — DICLOFENAC SODIUM 75 MG PO TBEC: 75.0000 mg | DELAYED_RELEASE_TABLET | Freq: Two times a day (BID) | ORAL | 0 refills | Status: DC

## 2016-07-02 NOTE — Patient Instructions (Addendum)
For calf pain which may be muscular stop ibuprofen and start diclofenac.  For back pain diclofenac may help as well. Use tramadol sparingly.  For muscle cramp left calf cmp and mg level.  Will get US lower ext to make sure no dvt. Please go down today.(test today at 12:30)  For lower leg pain xray tibia and fibula.  For hx of back pain get xray lspine. Will see what this shows. Red flag symptoms discussed that would warrant ED eval. If pain persists and radiates may need mri.  Follow up in 2 weeks or as needed

## 2016-07-02 NOTE — Progress Notes (Signed)
Subjective:    Patient ID: Monica Cantu, female    DOB: 11/15/1985, 31 y.o.   MRN: [id number]  HPI  Pt in with some left lower leg area pain. She points to region from lateral knee to her foot. Pain for one week. She has some faint numb/tingle sensation to that area. No trauma or fall. Pt has been walking some recently. Walking 45 minutes about 4 days a week. Pt tried ibuprofen but did not help. Pt reports no motor function changes.   Pt does mention some cramping of her leg about 2 times a week.  Pain lateral portion of her left calf. Some pain posterior portion of calf.  Pt at end of interview mentioned some low back pain for years. She sees chiropracter for this. Pt does have some pain her left hamstring area. Pt states no xray of her back done. Pt has used tramadol in past but uses it very sparingly.   LMP- June 12, 2016.   Review of Systems  Constitutional: Negative for chills, fatigue and fever.  Respiratory: Negative for cough, chest tightness, shortness of breath and wheezing.   Cardiovascular: Negative for chest pain and palpitations.  Gastrointestinal: Negative for abdominal pain.  Genitourinary: Negative for dysuria, flank pain and frequency.  Musculoskeletal: Positive for back pain.       Hx of back pain.  Leg pain calf area with cramping occasional  Neurological: Negative for dizziness, syncope, speech difficulty, weakness, numbness and headaches.  Hematological: Negative for adenopathy. Does not bruise/bleed easily.  Psychiatric/Behavioral: Negative for behavioral problems, confusion, decreased concentration and dysphoric mood.   Past Medical History:  Diagnosis Date  . Allergy   . Asthma   . Hypertension      Social History   Social History  . Marital status: Divorced    Spouse name: N/A  . Number of children: 2  . Years of education: N/A   Occupational History  . 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 Smoking  status: Never Smoker  . Smokeless tobacco: Never Used  . Alcohol use No  . Drug use: No  . Sexual activity: Yes    Birth control/ protection: Condom   Other Topics Concern  . Not on file   Social History Narrative   G4P2  (2 miscarriages)    Household-- pt and 2 children    Past Surgical History:  Procedure Laterality Date  . DILATION AND CURETTAGE OF UTERUS    . TONSILLECTOMY AND ADENOIDECTOMY      Family History  Problem Relation Age of Onset  . Hyperlipidemia Father   . Diabetes Maternal Grandmother   . Breast cancer Maternal Grandmother   . Heart disease Paternal Grandmother   . Hypertension Paternal Grandmother   . Colon cancer Neg Hx     No Known Allergies  Current Outpatient Prescriptions on File Prior to Visit  Medication Sig Dispense Refill  . albuterol (VENTOLIN HFA) 108 (90 Base) MCG/ACT inhaler Inhale 1-2 puffs into the lungs every 4 (four) hours as needed for wheezing or shortness of breath. 18 g 5  . beclomethasone (QVAR) 80 MCG/ACT inhaler Inhale 2 puffs into the lungs daily. 8.7 g 5  . cetirizine (ZYRTEC) 10 MG tablet Take 10 mg by mouth at bedtime.     . traMADol (ULTRAM) 50 MG tablet Take 1 tablet (50 mg total) by mouth every 6 (six) hours as needed for severe pain. 16 tablet 0   No current  facility-administered medications on file prior to visit.     BP (!) 124/92 (BP Location: Right Arm, Patient Position: Sitting, Cuff Size: Large)   Pulse 82   Temp 98.1 F (36.7 C) (Oral)   Resp 16   Ht 5\' 6"  (1.676 m)   Wt (!) 365 lb (165.6 kg)   LMP 06/11/2016 Comment: Shielded; Signed Waiver  BMI 58.91 kg/m       Objective:   Physical Exam  General- No acute distress. Pleasant patient. Neck- Full range of motion, no jvd Lungs- Clear, even and unlabored. Heart- regular rate and rhythm. Neurologic- CNII- XII grossly intact.  Lt lower ext- calf symmetric to rt side. No warmth. Lateral aspect calf tender and posterior aspect tender. Faint + homans  sign.  Back- no mid lumbar pain presenty(but comes and goes. Last had on Sunday night)  Lower ext- l5-s1 sensation intact. No motor deficits.      Assessment & Plan:  For calf pain which may be muscular stop ibuprofen and start diclofenac.  For back pain diclofenac may help as well. Use tramadol sparingly.  For muscle cramp left calf cmp and mg level.  Will get [place] lower ext to make sure no dvt. Please go down today.  For lower leg pain xray tibia and fibula.  For hx of back pain get xray lspine. Will see what this shows. Red flag symptoms discussed that would warrant ED eval. If pain persists and radiates may need mri.  Follow up in 2 weeks or as needed  Jabarri Stefanelli, Ramon Dredge, [employer]

## 2016-07-02 NOTE — Progress Notes (Signed)
Pre visit review using our clinic review tool, if applicable. No additional management support is needed unless otherwise documented below in the visit note/SLS  

## 2016-07-02 NOTE — Telephone Encounter (Signed)
Will you call pt GI MD and Cardiologist. He missed appointment recently due to hospitalization. Will you call both specialist and reschedule him. Thanks.

## 2016-07-03 NOTE — Telephone Encounter (Signed)
Called patient to find out who her cardiologist and gastroenterologist is, unable to leave VM, VM full

## 2016-07-13 ENCOUNTER — Other Ambulatory Visit: Payer: Self-pay | Admitting: Medical

## 2016-07-14 ENCOUNTER — Ambulatory Visit: Payer: BLUE CROSS/BLUE SHIELD | Admitting: Medical

## 2016-07-24 ENCOUNTER — Other Ambulatory Visit: Payer: Self-pay

## 2016-07-24 MED ORDER — DICLOFENAC SODIUM 75 MG PO TBEC: 75.0000 mg | DELAYED_RELEASE_TABLET | Freq: Two times a day (BID) | ORAL | 0 refills | Status: DC

## 2016-07-31 ENCOUNTER — Ambulatory Visit: Payer: BLUE CROSS/BLUE SHIELD | Admitting: Skilled Nursing Facility1

## 2016-08-11 ENCOUNTER — Encounter: Payer: Self-pay | Admitting: Internal Medicine

## 2016-08-11 MED ORDER — CETIRIZINE HCL 10 MG PO TABS: 10.0000 mg | ORAL_TABLET | Freq: Every day | ORAL | 5 refills | Status: DC

## 2016-08-11 MED ORDER — FLUTICASONE PROPIONATE 50 MCG/ACT NA SUSP: 2.0000 | Freq: Every day | NASAL | 5 refills | Status: DC | PRN

## 2016-08-12 ENCOUNTER — Telehealth: Payer: Self-pay | Admitting: Internal Medicine

## 2016-08-12 ENCOUNTER — Encounter: Payer: BLUE CROSS/BLUE SHIELD | Attending: Surgery | Admitting: Registered"

## 2016-08-12 ENCOUNTER — Encounter: Payer: Self-pay | Admitting: Registered"

## 2016-08-12 DIAGNOSIS — Z713 Dietary counseling and surveillance: Secondary | ICD-10-CM | POA: Insufficient documentation

## 2016-08-12 DIAGNOSIS — E669 Obesity, unspecified: Secondary | ICD-10-CM

## 2016-08-12 DIAGNOSIS — Z6841 Body Mass Index (BMI) 40.0 and over, adult: Secondary | ICD-10-CM | POA: Insufficient documentation

## 2016-08-12 DIAGNOSIS — Z01818 Encounter for other preprocedural examination: Secondary | ICD-10-CM | POA: Diagnosis not present

## 2016-08-12 NOTE — Progress Notes (Signed)
Pre-Op Assessment Visit:  Pre-Operative Sleeve Gastrectomy Surgery  Medical Nutrition Therapy:  Appt start time: 10:55  End time:  11:55  Patient was seen on 08/12/2016 for Pre-Operative Nutrition Assessment. Assessment [facility] on 08/12/2016.   Pt expectation of surgery: to be healthier, increase mobility, more energy, less pain in back and R knee  Pt expectation of Dietitian: Learn how to eat better  Start weight at [facility]: 366.4 BMI: 59.14   Pt arrived 8 minutes late. Pt states her max weight was 425 pounds and HCG her helped get under 400 lbs. Pt states she used to skip breakfast. Pt reports she is not huge vegetables. Pt reports she has eliminated sodas since Dec 2017 and drinks coffee once a month.  Pt is unsure of the amount of visits required per insurance prior to surgery.   24 hr Dietary Recall: First Meal: Herbalife shake Snack: peanuts, cashews  Second Meal: Herbalife shake, salad Snack: string cheese, wheat wafers, chocolate (sometimes) Third Meal: rotisserie chicken, baked fish, Malaw[place]urkey, vegetable Snack: popcorn Beverages: water (w/ Crystal Light packs), juice (when sick), coffee (about once a month)  Encouraged to engage in 150 minutes of moderate physical activity including cardiovascular and weight baring weekly  Handouts given during visit include:  . Pre-Op Goals . Bariatric Surgery Protein Shakes . Vitamins and Mineral Supplements  During the appointment today the following Pre-Op Goals were reviewed with the patient: . Maintain or lose weight as instructed by your surgeon . Make healthy food choices . Begin to limit portion sizes . Limited concentrated sugars and fried foods . Keep fat/sugar in the single digits per serving on          food labels . Practice CHEWING your food (aim for 30 chews per bite or until applesauce consistency) . Practice not drinking 15 minutes  before, during, and 30 minutes after each meal/snack . Avoid all carbonated beverages  . Avoid/limit caffeinated beverages  . Avoid all sugar-sweetened beverages . Consume 3 meals per day; eat every 3-5 hours . Make a list of non-food related activities . Aim for 64-100 ounces of FLUID daily  . Aim for at least 60-80 grams of PROTEIN daily . Look for a liquid protein source that contain ?15 g protein and ?5 g carbohydrate  (ex: shakes, drinks, shots)  -Follow diet recommendations listed below   Energy and Macronutrient Recomendations: Calories: 1600 Carbohydrate: 180 Protein: 120 Fat: 44  Demonstrated degree of understanding via:  Teach Back   Teaching Method Utilized:  Visual Auditory Hands on  Barriers to learning/adherence to lifestyle change: none  Patient to call the [facility] to enroll in Pre-Op and Post-Op Nutrition Education when surgery date is scheduled.

## 2016-08-12 NOTE — Telephone Encounter (Signed)
lvm following up with pt to relay per CMA

## 2016-08-12 NOTE — Telephone Encounter (Signed)
Pt called in because she says that she is going to have bariatric surgery and have to meet with her PCP 3 times before procedure. Pt says that she is scheduled to come in on 5/18 for a F/U. She would like to know if that appt could count? Or do she need a longer timeframe?    Please advise for pt   [phone] I will call pt back if needed.

## 2016-08-12 NOTE — Telephone Encounter (Signed)
I would assume routine follow-up's would count as a visit, Pt can ask her bariatric provider for clarification.

## 2016-08-13 ENCOUNTER — Telehealth: Payer: Self-pay | Admitting: Internal Medicine

## 2016-08-13 NOTE — Telephone Encounter (Signed)
Caller name: Relationship to patient: Self Can be reached: [phone]  Pharmacy:  Reason for call: Patient states [facility] needs office note of any discussions of weight loss on this patient.

## 2016-08-13 NOTE — Telephone Encounter (Signed)
OV notes faxed to [facility].

## 2016-08-22 ENCOUNTER — Ambulatory Visit: Payer: BLUE CROSS/BLUE SHIELD | Admitting: Internal Medicine

## 2016-08-25 ENCOUNTER — Telehealth: Payer: Self-pay | Admitting: Internal Medicine

## 2016-08-25 ENCOUNTER — Ambulatory Visit: Payer: BLUE CROSS/BLUE SHIELD | Admitting: Internal Medicine

## 2016-08-25 NOTE — Telephone Encounter (Addendum)
Patient lvm at 2:49pm today cancelling 4pm due to work coverage being short staff, [facility]16 charge or no charge

## 2016-08-25 NOTE — Telephone Encounter (Signed)
No charge. 

## 2016-09-03 ENCOUNTER — Encounter: Payer: Self-pay | Admitting: Internal Medicine

## 2016-09-03 ENCOUNTER — Ambulatory Visit (INDEPENDENT_AMBULATORY_CARE_PROVIDER_SITE_OTHER): Payer: BLUE CROSS/BLUE SHIELD | Admitting: Internal Medicine

## 2016-09-03 VITALS — BP 128/72 | HR 76 | Temp 98.2°F | Resp 14 | Ht 66.0 in | Wt 367.0 lb

## 2016-09-03 DIAGNOSIS — R739 Hyperglycemia, unspecified: Secondary | ICD-10-CM

## 2016-09-03 DIAGNOSIS — J4520 Mild intermittent asthma, uncomplicated: Secondary | ICD-10-CM | POA: Diagnosis not present

## 2016-09-03 DIAGNOSIS — E049 Nontoxic goiter, unspecified: Secondary | ICD-10-CM

## 2016-09-03 DIAGNOSIS — M545 Low back pain: Secondary | ICD-10-CM | POA: Diagnosis not present

## 2016-09-03 DIAGNOSIS — G8929 Other chronic pain: Secondary | ICD-10-CM | POA: Diagnosis not present

## 2016-09-03 LAB — HEMOGLOBIN A1C: HEMOGLOBIN A1C: 6.2 % (ref 4.6–6.5)

## 2016-09-03 NOTE — Progress Notes (Signed)
Subjective:    Patient ID: Monica Cantu, female    DOB: Sep 07, 1985, 31 y.o.   MRN: [id number]  DOS:  09/03/2016 Type of visit - description : f/u Interval history: Since the last visit she is doing okay. To have bariatric surgery soon. Also, 2 weeks ago went to urgent care, strep test was negative, was diagnosed with bilateral ear infection. Had sore throat that time. Ears are okay now however she still have some left-sided sore throat when she swallows. Also, has back pain, episodic, usually if she lays down in a flat surface for too long, + radiation to the left leg posteriorly up to the calf. Was recently seen here at this office by another provider, ultrasound of the left leg and x-ray of the leg were negative. Asthma, well-controlled, uses albuterol no more than 2 times per week.    Wt Readings from Last 3 Encounters:  09/03/16 (!) 367 lb (166.5 kg)  08/12/16 (!) 366 lb 6.4 oz (166.2 kg)  07/02/16 (!) 365 lb (165.6 kg)     Review of Systems  Denies fever chills. Some mucus accumulation on the throat in the mornings. No nausea, vomiting, diarrhea  Past Medical History:  Diagnosis Date  . Allergy   . Asthma   . Hypertension     Past Surgical History:  Procedure Laterality Date  . DILATION AND CURETTAGE OF UTERUS    . TONSILLECTOMY AND ADENOIDECTOMY      Social History   Social History  . Marital status: Divorced    Spouse name: N/A  . Number of children: 2  . Years of education: N/A   Occupational History  . 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 Smoking status: Never Smoker  . Smokeless tobacco: Never Used  . Alcohol use No  . Drug use: No  . Sexual activity: Yes    Birth control/ protection: Condom   Other Topics Concern  . Not on file   Social History Narrative   G4P2  (2 miscarriages)    Household-- pt and 2 children      Allergies as of 09/03/2016   No Known Allergies     Medication List         Accurate as of 09/03/16  9:07 AM. Always use your most recent med list.          albuterol 108 (90 Base) MCG/ACT inhaler Commonly known as:  VENTOLIN HFA Inhale 1-2 puffs into the lungs every 4 (four) hours as needed for wheezing or shortness of breath.   beclomethasone 80 MCG/ACT inhaler Commonly known as:  QVAR Inhale 2 puffs into the lungs daily.   cetirizine 10 MG tablet Commonly known as:  ZYRTEC Take 1 tablet (10 mg total) by mouth at bedtime.   diclofenac 75 MG EC tablet Commonly known as:  VOLTAREN Take 1 tablet (75 mg total) by mouth 2 (two) times daily.   fluticasone 50 MCG/ACT nasal spray Commonly known as:  FLONASE Place 2 sprays into both nostrils daily as needed for allergies or rhinitis.   traMADol 50 MG tablet Commonly known as:  ULTRAM Take 1 tablet (50 mg total) by mouth every 6 (six) hours as needed for severe pain.          Objective:   Physical Exam BP 128/72 (BP Location: Left Wrist, Patient Position: Sitting, Cuff Size: Small)   Pulse 76   Temp 98.2 F (36.8 C) (Oral)   Resp 14  Ht 5\' 6"  (1.676 m)   Wt (!) 367 lb (166.5 kg)   BMI 59.24 kg/m  General:   Well developed, morbidly obese appearing . NAD.  HEENT:  Normocephalic . Face symmetric, atraumatic. TMs normal. Throat: No red, symmetric, no discharge Lungs:  CTA B Normal respiratory effort, no intercostal retractions, no accessory muscle use. Heart: RRR,  no murmur.  No pretibial edema bilaterally  Skin: Not pale. Not jaundice Neurologic:  alert & oriented X3.  Speech normal, gait appropriate for age and unassisted Psych--  Cognition and judgment appear intact.  Cooperative with normal attention span and concentration.  Behavior appropriate. No anxious or depressed appearing.      Assessment & Plan:   Assessment Prediabetes: A1c 6.0 05-2015 Elevated BP w/o dx of HTN Thyromegaly-nodules (see us 04-2015), bx rx per endo : (-) 06-2016 Asthma; Allergies Morbid obesity H/o  fibroids  Not on BCP as off 04-2015   PLAN:  Prediabetes: Check A1c Morbid obesity: Planning to have a bariatric procedure soon. Thyromegaly: saw endo 05-2016, note reviewed   they recommended FNA (bx benign 06-2016), was rec to RTC one year Lumbalgia: Ongoing, chronic sxs. Take tramadol sporadically and ibuprofen. Like something to be done. Refer to sports medicine. Asthma: Well controlled Sore throat: S/P antibiotics, had mild persistent pain, exam negative, recommend observation for now, call if not back to normal soon. RTC 6 months

## 2016-09-03 NOTE — Progress Notes (Signed)
Pre visit review using our clinic review tool, if applicable. No additional management support is needed unless otherwise documented below in the visit note. 

## 2016-09-03 NOTE — Patient Instructions (Signed)
GO TO THE LAB : Get the blood work     GO TO THE FRONT DESK Schedule your next appointment for a  routine checkup in 6 months  

## 2016-09-04 NOTE — Assessment & Plan Note (Signed)
Prediabetes: Check A1c Morbid obesity: Planning to have a bariatric procedure soon. Thyromegaly: saw endo 05-2016, note reviewed   they recommended FNA (bx benign 06-2016), was rec to RTC one year Lumbalgia: Ongoing, chronic sxs. Take tramadol sporadically and ibuprofen. Like something to be done. Refer to sports medicine. Asthma: Well controlled Sore throat: S/P antibiotics, had mild persistent pain, exam negative, recommend observation for now, call if not back to normal soon. RTC 6 months

## 2016-09-08 ENCOUNTER — Encounter: Payer: BLUE CROSS/BLUE SHIELD | Attending: Surgery | Admitting: Skilled Nursing Facility1

## 2016-09-08 ENCOUNTER — Encounter: Payer: Self-pay | Admitting: Skilled Nursing Facility1

## 2016-09-08 DIAGNOSIS — Z6841 Body Mass Index (BMI) 40.0 and over, adult: Secondary | ICD-10-CM | POA: Insufficient documentation

## 2016-09-08 DIAGNOSIS — Z713 Dietary counseling and surveillance: Secondary | ICD-10-CM | POA: Diagnosis not present

## 2016-09-08 DIAGNOSIS — Z01818 Encounter for other preprocedural examination: Secondary | ICD-10-CM | POA: Insufficient documentation

## 2016-09-08 NOTE — Progress Notes (Signed)
  Pre-Operative Nutrition Class:  Appt start time: 3335   End time:  1830.  Patient was seen on 09/08/2016 for Pre-Operative Bariatric Surgery Education at [facility].   Surgery date:  Surgery type:  Start weight at [facility]: 366.4 Weight today: not taken/pt arrived late to class  TANITA  BODY COMP RESULTS  N/A   BMI (kg/m^2)    Fat Mass (lbs)    Fat Free Mass (lbs)    Total Body Water (lbs)    Samples given per MNT protocol. Patient educated on appropriate usage: Opurity Multivitamin Lot # [id number] Exp: 04/19  celebrate Calcium Citrate Lot # [id number] Exp: 07/2017  premierProtein shake Lot # [id number] Exp: 27/nov/2018  The following the learning objectives were met by the patient during this course:  Identify Pre-Op Dietary Goals and will begin 2 weeks pre-operatively  Identify appropriate sources of fluids and proteins   State protein recommendations and appropriate sources pre and post-operatively  Identify Post-Operative Dietary Goals and will follow for 2 weeks post-operatively  Identify appropriate multivitamin and calcium sources  Describe the need for physical activity post-operatively and will follow MD recommendations  State when to call healthcare provider regarding medication questions or post-operative complications  Handouts given during class include:  Pre-Op Bariatric Surgery Diet Handout  Protein Shake Handout  Post-Op Bariatric Surgery Nutrition Handout  [facility] Program Information Flyer  Support Group Information Flyer  [facility] Bariatric Supplements Price List  Follow-Up Plan: Patient will follow-up at [facility] 2 weeks post operatively for diet advancement per MD.

## 2016-09-09 ENCOUNTER — Ambulatory Visit: Payer: BLUE CROSS/BLUE SHIELD | Admitting: Family Medicine

## 2016-09-25 ENCOUNTER — Encounter (HOSPITAL_BASED_OUTPATIENT_CLINIC_OR_DEPARTMENT_OTHER): Payer: Self-pay

## 2016-09-25 ENCOUNTER — Ambulatory Visit (INDEPENDENT_AMBULATORY_CARE_PROVIDER_SITE_OTHER): Payer: BLUE CROSS/BLUE SHIELD | Admitting: Family Medicine

## 2016-09-25 ENCOUNTER — Encounter: Payer: Self-pay | Admitting: Family Medicine

## 2016-09-25 ENCOUNTER — Emergency Department (HOSPITAL_BASED_OUTPATIENT_CLINIC_OR_DEPARTMENT_OTHER)
Admission: EM | Admit: 2016-09-25 | Discharge: 2016-09-25 | Disposition: A | Discharge status: Home / Self Care | Payer: BLUE CROSS/BLUE SHIELD | Attending: Emergency Medicine | Admitting: Emergency Medicine

## 2016-09-25 VITALS — BP 185/95 | HR 81 | Ht 65.0 in | Wt 370.0 lb

## 2016-09-25 DIAGNOSIS — M5432 Sciatica, left side: Secondary | ICD-10-CM | POA: Diagnosis not present

## 2016-09-25 DIAGNOSIS — I10 Essential (primary) hypertension: Secondary | ICD-10-CM | POA: Insufficient documentation

## 2016-09-25 DIAGNOSIS — M545 Low back pain, unspecified: Secondary | ICD-10-CM

## 2016-09-25 DIAGNOSIS — M5416 Radiculopathy, lumbar region: Secondary | ICD-10-CM

## 2016-09-25 DIAGNOSIS — J45909 Unspecified asthma, uncomplicated: Secondary | ICD-10-CM | POA: Diagnosis not present

## 2016-09-25 DIAGNOSIS — M79605 Pain in left leg: Secondary | ICD-10-CM

## 2016-09-25 DIAGNOSIS — Z79899 Other long term (current) drug therapy: Secondary | ICD-10-CM | POA: Insufficient documentation

## 2016-09-25 MED ORDER — PREDNISONE 10 MG PO TABS: ORAL_TABLET | ORAL | 0 refills | Status: DC

## 2016-09-25 MED ORDER — HYDROCODONE-ACETAMINOPHEN 5-325 MG PO TABS: 1.0000 | ORAL_TABLET | ORAL | 0 refills | Status: DC | PRN

## 2016-09-25 MED ORDER — NAPROXEN 250 MG PO TABS
500.0000 mg | ORAL_TABLET | Freq: Once | ORAL | Status: AC
  Administered 2016-09-25: 500 mg via ORAL
  Filled 2016-09-25: qty 2

## 2016-09-25 NOTE — ED Triage Notes (Signed)
Pt c/o lower left back pain that started an hour ago, has not tried to take anything for pain, denies any groin numbness, denies incontinence.  States that when she woke up she had to "crawl around," appears to be ambulating without difficulty at this time

## 2016-09-25 NOTE — ED Notes (Signed)
Pt verbalizes understanding of d/c instructions and denies any further needs at this time. 

## 2016-09-25 NOTE — Patient Instructions (Signed)
You have lumbar radiculopathy (a pinched nerve in your low back). Ok to take tylenol for baseline pain relief (1-2 extra strength tabs 3x/day) A prednisone dose pack is the best option for immediate relief and may be prescribed.  Consider muscle relaxant, pain medication but these do not make you better faster. Stay as active as possible. Physical therapy has been shown to be helpful as well - start this if it's not too expensive. Do home exercises on days you don't go to therapy. Strengthening of low back muscles, abdominal musculature are key for long term pain relief. If not improving, will consider further imaging (MRI). Follow up with me in 6 weeks but call sooner if you're struggling.

## 2016-09-25 NOTE — ED Provider Notes (Addendum)
[facility] Provider Note: Lowella DellJ. Lane Vanna Shavers, MD, FACEP  CSN: [id number] MRN: [id number] ARRIVAL: 09/25/16 at 0259 ROOM: MH05/MH05   CHIEF COMPLAINT  Back Pain   HISTORY OF PRESENT ILLNESS  Monica Cantu is a 31 y.o. female with a history of back pain. This back pain tends to be episodic and is usually exacerbated by sleeping on her back so she routinely sleeps on her side. She awoke this morning about an hour ago to go to the bathroom and had the sudden onset of severe pain in her left buttock radiating down the back of her left leg. She states the pain was so severe she had to crawl to the bathroom with the assistance of her son. The pain has subsequently improved but is still present. Pain is worse with movement of the left hip. She was able to ambulate into the ED without difficulty. She denies numbness or weakness in the leg. She has not taken anything for the pain.  [facility] [place] reveals the patient has received One prescription for hydrocodone in the past year from her dentist.   Past Medical History:  Diagnosis Date  . Allergy   . Asthma   . Hypertension     Past Surgical History:  Procedure Laterality Date  . DILATION AND CURETTAGE OF UTERUS    . TONSILLECTOMY AND ADENOIDECTOMY      Family History  Problem Relation Age of Onset  . Hyperlipidemia Father   . Diabetes Maternal Grandmother   . Breast cancer Maternal Grandmother   . Heart disease Paternal Grandmother   . Hypertension Paternal Grandmother   . Stroke Other   . Colon cancer Neg Hx     Social History  Substance Use Topics  . Smoking status: Never Smoker  . Smokeless tobacco: Never Used  . Alcohol use No    Prior to Admission medications   Medication Sig Start Date End Date Taking? Authorizing Provider  albuterol (VENTOLIN HFA) 108 (90 Base) MCG/ACT inhaler Inhale 1-2 puffs into the lungs every 4 (four) hours as needed for  wheezing or shortness of breath. 05/27/16   Wanda PlumpPaz, Jose E, MD  beclomethasone (QVAR) 80 MCG/ACT inhaler Inhale 2 puffs into the lungs daily. 05/23/16   Wanda PlumpPaz, Jose E, MD  cetirizine (ZYRTEC) 10 MG tablet Take 1 tablet (10 mg total) by mouth at bedtime. 08/11/16   Wanda PlumpPaz, Jose E, MD  fluticasone [facility](FLONASE) 50 MCG/ACT nasal spray Place 2 sprays into both nostrils daily as needed for allergies or rhinitis. Patient not taking: Reported on 08/12/2016 08/11/16   Wanda PlumpPaz, Jose E, MD  traMADol (ULTRAM) 50 MG tablet Take 1 tablet (50 mg total) by mouth every 6 (six) hours as needed for severe pain. Patient not taking: Reported on 08/12/2016 10/10/15   Saguier, Ramon DredgeEdward, PA-C    Allergies Patient has no known allergies.   REVIEW OF SYSTEMS  Negative except as noted here or in the History of Present Illness.   PHYSICAL EXAMINATION  Initial Vital Signs Blood pressure (!) 144/102, pulse 93, temperature 98.3 F (36.8 C), temperature source Oral, resp. rate 18, height 5\' 5"  (1.651 m), weight (!) 167.4 kg (369 lb), last menstrual period 09/06/2016, SpO2 100 %.  Examination General: Well-developed, morbidly obese female in no acute distress; appearance consistent with age of record HENT: normocephalic; atraumatic Eyes: pupils equal, round and reactive to light; extraocular muscles intact Neck: supple Heart: regular rate and rhythm Lungs: clear to auscultation bilaterally Abdomen:  soft; obese; nontender; bowel sounds present Back: Left sciatic tenderness with positive straight leg raise on the left Extremities: No deformity; full range of motion except left hip limited by pain; pulses normal; normal gait Neurologic: Awake, alert and oriented; motor function intact in all extremities and symmetric; sensation intact and symmetric in the lower extremities no facial droop Skin: Warm and dry Psychiatric: Normal mood and affect   RESULTS  Summary of this visit's results, reviewed by myself:   EKG  Interpretation  Date/Time:    Ventricular Rate:    PR Interval:    QRS Duration:   QT Interval:    QTC Calculation:   R Axis:     Text Interpretation:        Laboratory Studies: No results found for this or any previous visit (from the past 24 hour(s)). Imaging Studies: No results found.  ED COURSE  Nursing notes and initial vitals signs, including pulse oximetry, reviewed.  Vitals:   09/25/16 0307 09/25/16 0311  BP:  (!) 144/102  Pulse:  93  Resp:  18  Temp:  98.3 F (36.8 C)  TempSrc:  Oral  SpO2:  100%  Weight: (!) 167.4 kg (369 lb)   Height: 5\' 5"  (1.651 m)    The patient's symptoms are consistent with sciatica although the sudden onset and rapid improvement may represent an element of muscle spasm as well.  PROCEDURES    ED DIAGNOSES     ICD-10-CM   1. Sciatica of left side M54.32        Cole Klugh, MD 09/25/16 0329    Paula Libra, MD 09/25/16 [phone]

## 2016-09-26 ENCOUNTER — Ambulatory Visit: Payer: BLUE CROSS/BLUE SHIELD | Admitting: Family Medicine

## 2016-09-30 DIAGNOSIS — M79605 Pain in left leg: Secondary | ICD-10-CM | POA: Insufficient documentation

## 2016-09-30 DIAGNOSIS — M545 Low back pain, unspecified: Secondary | ICD-10-CM | POA: Insufficient documentation

## 2016-09-30 NOTE — Progress Notes (Signed)
PCP and consultation requested by: Wanda PlumpPaz, Jose E, MD  Subjective:   HPI: Patient is a 31 y.o. female here for low back pain.  Patient reports she's been struggling with low back pain for about 2 years. Pain very severe this morning so went to ED. Pain is midline, 0/10 at rest but up to 10/10 and sharp at worst. Radiates down left leg to foot. No numbness or tingling. No bowel/bladder dysfunction. Rarely takes diclofenac, tramadol for this - thinks diclofenac gave her nausea. Felt worse after trying chiropractic care. Ibuprofen takes edge off. Pain with sitting, standing, or walking a long time.  Past Medical History:  Diagnosis Date  . Allergy   . Asthma   . Hypertension     Current Outpatient Prescriptions on File Prior to Visit  Medication Sig Dispense Refill  . albuterol (VENTOLIN HFA) 108 (90 Base) MCG/ACT inhaler Inhale 1-2 puffs into the lungs every 4 (four) hours as needed for wheezing or shortness of breath. 18 g 5  . beclomethasone (QVAR) 80 MCG/ACT inhaler Inhale 2 puffs into the lungs daily. 8.7 g 5  . cetirizine (ZYRTEC) 10 MG tablet Take 1 tablet (10 mg total) by mouth at bedtime. 30 tablet 5  . HYDROcodone-acetaminophen (NORCO) 5-325 MG tablet Take 1 tablet by mouth every 4 (four) hours as needed (for pain). 6 tablet 0   No current facility-administered medications on file prior to visit.     Past Surgical History:  Procedure Laterality Date  . DILATION AND CURETTAGE OF UTERUS    . TONSILLECTOMY AND ADENOIDECTOMY      No Known Allergies  Social History   Social History  . Marital status: Divorced    Spouse name: N/A  . Number of children: 2  . Years of education: N/A   Occupational History  . 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 Smoking status: Never Smoker  . Smokeless tobacco: Never Used  . Alcohol use No  . Drug use: No  . Sexual activity: Yes    Birth control/ protection: Condom   Other Topics Concern  . Not  on file   Social History Narrative   G4P2  (2 miscarriages)    Household-- pt and 2 children    Family History  Problem Relation Age of Onset  . Hyperlipidemia Father   . Diabetes Maternal Grandmother   . Breast cancer Maternal Grandmother   . Heart disease Paternal Grandmother   . Hypertension Paternal Grandmother   . Stroke Other   . Colon cancer Neg Hx     BP (!) 185/95   Pulse 81   Ht 5\' 5"  (1.651 m)   Wt (!) 370 lb (167.8 kg)   LMP 09/06/2016   BMI 61.57 kg/m   Review of Systems: See HPI above.     Objective:  Physical Exam:  Gen: NAD, comfortable in exam room  Back: No gross deformity, scoliosis. TTP left lumbar paraspinal region, buttock.  No midline or bony TTP. FROM. Strength LEs 5/5 all muscle groups.   2+ MSRs in achilles tendons, trace patellar, equal bilaterally. Positive SLR on left, negative right. Sensation intact to light touch bilaterally. Negative logroll bilateral hips   Assessment & Plan:  1. Low back pain radiating to left leg - consistent with lumbar radiculopathy.  Start with prednisone dose pack, physical therapy.  Consider muscle relaxant, pain medication.  Heat for spasms.  F/u in 6 weeks but call sooner if not improving.

## 2016-09-30 NOTE — Assessment & Plan Note (Signed)
consistent with lumbar radiculopathy.  Start with prednisone dose pack, physical therapy.  Consider muscle relaxant, pain medication.  Heat for spasms.  F/u in 6 weeks but call sooner if not improving.

## 2016-10-02 ENCOUNTER — Ambulatory Visit: Payer: BLUE CROSS/BLUE SHIELD | Attending: Family Medicine | Admitting: Physical Therapy

## 2016-10-18 ENCOUNTER — Encounter: Payer: Self-pay | Admitting: Family Medicine

## 2016-10-20 MED ORDER — IBUPROFEN 800 MG PO TABS: 800.0000 mg | ORAL_TABLET | Freq: Three times a day (TID) | ORAL | 0 refills | Status: DC | PRN

## 2016-10-20 NOTE — Addendum Note (Signed)
Addended by: Lenda KelpHUDNALL, SHANE R on: 10/20/2016 11:33 AM   Modules accepted: Orders

## 2016-10-31 ENCOUNTER — Telehealth: Payer: Self-pay | Admitting: Internal Medicine

## 2016-10-31 NOTE — Telephone Encounter (Signed)
Patient called to advise that she has been experiencing a lot of SOB when she lies down. She states that her throat feels "full". She has questions on whether the nodules might have something to do with it. Her inhalers are not resolving her issues, and this is a new development since being prescribed the inhalers. She states that it is happening a few times per week at this point.   She is going to set an appt with her PCP today  Verified cell # to be best contact

## 2016-10-31 NOTE — Telephone Encounter (Signed)
Called and advised patient of note.

## 2016-10-31 NOTE — Telephone Encounter (Signed)
I doubt this is from her thyroid nodules... Will see what PCP's opinion is.

## 2016-10-31 NOTE — Telephone Encounter (Signed)
See message below. Thank you

## 2016-11-03 ENCOUNTER — Ambulatory Visit: Payer: BLUE CROSS/BLUE SHIELD | Admitting: Internal Medicine

## 2016-11-05 ENCOUNTER — Encounter (HOSPITAL_BASED_OUTPATIENT_CLINIC_OR_DEPARTMENT_OTHER): Payer: Self-pay

## 2016-11-05 ENCOUNTER — Emergency Department (HOSPITAL_BASED_OUTPATIENT_CLINIC_OR_DEPARTMENT_OTHER)
Admission: EM | Admit: 2016-11-05 | Discharge: 2016-11-06 | Disposition: A | Discharge status: Home / Self Care | Payer: BLUE CROSS/BLUE SHIELD | Attending: Emergency Medicine | Admitting: Emergency Medicine

## 2016-11-05 ENCOUNTER — Emergency Department (HOSPITAL_BASED_OUTPATIENT_CLINIC_OR_DEPARTMENT_OTHER): Payer: BLUE CROSS/BLUE SHIELD

## 2016-11-05 DIAGNOSIS — R0789 Other chest pain: Secondary | ICD-10-CM | POA: Insufficient documentation

## 2016-11-05 DIAGNOSIS — Z79899 Other long term (current) drug therapy: Secondary | ICD-10-CM | POA: Diagnosis not present

## 2016-11-05 DIAGNOSIS — I10 Essential (primary) hypertension: Secondary | ICD-10-CM | POA: Insufficient documentation

## 2016-11-05 DIAGNOSIS — R079 Chest pain, unspecified: Secondary | ICD-10-CM | POA: Diagnosis present

## 2016-11-05 DIAGNOSIS — J9801 Acute bronchospasm: Secondary | ICD-10-CM | POA: Insufficient documentation

## 2016-11-05 LAB — BASIC METABOLIC PANEL
ANION GAP: 8 (ref 5–15)
BUN: 16 mg/dL (ref 6–20)
CALCIUM: 8.7 mg/dL — AB (ref 8.9–10.3)
CO2: 24 mmol/L (ref 22–32)
Chloride: 107 mmol/L (ref 101–111)
Creatinine, Ser: 1.01 mg/dL — ABNORMAL HIGH (ref 0.44–1.00)
GFR calc non Af Amer: >60 mL/min (ref >60)
Glucose, Bld: 99 mg/dL (ref 65–99)
Potassium: 3.8 mmol/L (ref 3.5–5.1)
SODIUM: 139 mmol/L (ref 135–145)

## 2016-11-05 LAB — CBC
HCT: 35 % — ABNORMAL LOW (ref 36.0–46.0)
HEMOGLOBIN: 11.6 g/dL — AB (ref 12.0–15.0)
MCH: 27.8 pg (ref 26.0–34.0)
MCHC: 33.1 g/dL (ref 30.0–36.0)
MCV: 83.7 fL (ref 78.0–100.0)
Platelets: 208 10*3/uL (ref 150–400)
RBC: 4.18 MIL/uL (ref 3.87–5.11)
RDW: 16.2 % — ABNORMAL HIGH (ref 11.5–15.5)
WBC: 13.6 10*3/uL — AB (ref 4.0–10.5)

## 2016-11-05 LAB — TROPONIN I: Troponin I: <0.03 ng/mL (ref <0.03)

## 2016-11-05 MED ORDER — IPRATROPIUM-ALBUTEROL 0.5-2.5 (3) MG/3ML IN SOLN
3.0000 mL | RESPIRATORY_TRACT | Status: DC
  Administered 2016-11-05: 3 mL via RESPIRATORY_TRACT
  Filled 2016-11-05: qty 3

## 2016-11-05 NOTE — ED Provider Notes (Signed)
[facility] Provider Note: Lowella DellJ. Lane Justin Meisenheimer, MD, FACEP  CSN: [id number] MRN: [id number] ARRIVAL: 11/05/16 at 2110 ROOM: MH12/MH12   CHIEF COMPLAINT  Chest Pain   HISTORY OF PRESENT ILLNESS  11/06/16 1:20 AM Monica Cantu is a 31 y.o. female with a history of asthma. She has had an exacerbation of her shortness of breath and wheezing for the past week. She has been using her albuterol and Qvar inhalers. Her symptoms are worse when lying supine and she often wakes up gasping. These episodes are improved using her inhalers. She has also been having intermittent pain in her chest. This began in her left chest but has also been present in her mid chest and back. She describes this as a dull achy pain that is not severe. It is worse with coughing or deep breath. She has not taken any pain medication for this. She denies nausea, vomiting or diarrhea. She denies fever or chills.   Past Medical History:  Diagnosis Date  . Allergy   . Asthma   . Hypertension     Past Surgical History:  Procedure Laterality Date  . DILATION AND CURETTAGE OF UTERUS    . TONSILLECTOMY AND ADENOIDECTOMY      Family History  Problem Relation Age of Onset  . Hyperlipidemia Father   . Diabetes Maternal Grandmother   . Breast cancer Maternal Grandmother   . Heart disease Paternal Grandmother   . Hypertension Paternal Grandmother   . Stroke Other   . Colon cancer Neg Hx     Social History  Substance Use Topics  . Smoking status: Never Smoker  . Smokeless tobacco: Never Used  . Alcohol use No    Prior to Admission medications   Medication Sig Start Date End Date Taking? Authorizing Provider  albuterol (VENTOLIN HFA) 108 (90 Base) MCG/ACT inhaler Inhale 1-2 puffs into the lungs every 4 (four) hours as needed for wheezing or shortness of breath. 05/27/16   Wanda PlumpPaz, Jose E, MD  beclomethasone (QVAR) 80 MCG/ACT inhaler Inhale 2 puffs into the lungs daily. 05/23/16   Wanda PlumpPaz, Jose E, MD  cetirizine  (ZYRTEC) 10 MG tablet Take 1 tablet (10 mg total) by mouth at bedtime. 08/11/16   Wanda PlumpPaz, Jose E, MD  HYDROcodone-acetaminophen (NORCO) 5-325 MG tablet Take 1 tablet by mouth every 4 (four) hours as needed (for pain). 09/25/16   Merville Hijazi, MD  ibuprofen (ADVIL,MOTRIN) 800 MG tablet Take 1 tablet (800 mg total) by mouth every 8 (eight) hours as needed. 10/20/16   Lenda KelpHudnall, Shane R, MD    Allergies Patient has no known allergies.   REVIEW OF SYSTEMS  Negative except as noted here or in the History of Present Illness.   PHYSICAL EXAMINATION  Initial Vital Signs Blood pressure (!) 159/79, pulse (!) 101, temperature 99.3 F (37.4 C), temperature source Oral, resp. rate (!) 9, height 5\' 5"  (1.651 m), weight (!) 176 kg (388 lb), last menstrual period 10/26/2016, SpO2 100 %.  Examination General: Well-developed, morbidly obese female in no acute distress; appearance consistent with age of record HENT: normocephalic; atraumatic Eyes: pupils equal, round and reactive to light; extraocular muscles intact Neck: supple Heart: regular rate and rhythm Lungs: clear to auscultation bilaterally Chest: Nontender Abdomen: soft; obese; nontender; bowel sounds present Extremities: No deformity; full range of motion; pulses normal Neurologic: Awake, alert and oriented; motor function intact in all extremities and symmetric; no facial droop Skin: Warm and dry Psychiatric: Normal mood and affect   RESULTS  Summary of this visit's results, reviewed by myself:   EKG Interpretation  Date/Time:  Wednesday November 05 2016 21:26:03 EDT Ventricular Rate:  106 PR Interval:    QRS Duration: 92 QT Interval:  317 QTC Calculation: 421 R Axis:   36 Text Interpretation:  Sinus tachycardia Borderline T wave abnormalities No significant change was found Confirmed by Paula LibraMolpus, Amro Winebarger ([id number]) on 11/05/2016 11:21:52 PM      Laboratory Studies: Results for orders placed or performed during the hospital encounter of  11/05/16 (from the past 24 hour(s))  Basic metabolic panel     Status: Abnormal   Collection Time: 11/05/16  9:52 PM  Result Value Ref Range   Sodium 139 135 - 145 mmol/L   Potassium 3.8 3.5 - 5.1 mmol/L   Chloride 107 101 - 111 mmol/L   CO2 24 22 - 32 mmol/L   Glucose, Bld 99 65 - 99 mg/dL   BUN 16 6 - 20 mg/dL   Creatinine, Ser 2.131.01 (H) 0.44 - 1.00 mg/dL   Calcium 8.7 (L) 8.9 - 10.3 mg/dL   GFR calc non Af Amer >60 >60 mL/min   GFR calc Af Amer >60 >60 mL/min   Anion gap 8 5 - 15  CBC     Status: Abnormal   Collection Time: 11/05/16  9:52 PM  Result Value Ref Range   WBC 13.6 (H) 4.0 - 10.5 K/uL   RBC 4.18 3.87 - 5.11 MIL/uL   Hemoglobin 11.6 (L) 12.0 - 15.0 g/dL   HCT 08.635.0 (L) 57.836.0 - 46.946.0 %   MCV 83.7 78.0 - 100.0 fL   MCH 27.8 26.0 - 34.0 pg   MCHC 33.1 30.0 - 36.0 g/dL   RDW 62.916.2 (H) 52.811.5 - 41.315.5 %   Platelets 208 150 - 400 K/uL  Troponin I     Status: None   Collection Time: 11/05/16  9:52 PM  Result Value Ref Range   Troponin I <0.03 <0.03 ng/mL   Imaging Studies: Dg Chest 2 View  Result Date: 11/05/2016 CLINICAL DATA:  Chest pain, shortness of breath EXAM: CHEST  2 VIEW COMPARISON:  07/01/2016 FINDINGS: Lungs are clear.  No pleural effusion or pneumothorax. The heart is normal in size. Visualized osseous structures are within normal limits. IMPRESSION: Normal chest radiographs. Electronically Signed   By: Charline BillsSriyesh  Krishnan M.D.   On: 11/05/2016 21:41    ED COURSE  Nursing notes and initial vitals signs, including pulse oximetry, reviewed.  Vitals:   11/05/16 2215 11/05/16 2230 11/05/16 2330 11/05/16 2335  BP: (!) 164/83 (!) 159/79 121/64   Pulse: (!) 104 (!) 101 89   Resp: 17 (!) 9 17   Temp:      TempSrc:      SpO2: 99% 100% 95% 100%  Weight:      Height:       1:19 AM Air movement improved after DuoNeb treatment. Patient advised to take NSAIDs for chest wall pain.  PROCEDURES    ED DIAGNOSES     ICD-10-CM   1. Bronchospasm J98.01   2. Atypical  chest pain R07.89        Paula LibraMolpus, Lorenso Quirino, MD 11/06/16 0120

## 2016-11-05 NOTE — ED Triage Notes (Signed)
C/o CP, SOB x 8 days-states she has been using albuterol w/o relief-NAD-RT in for assessment in triage-NAD-steady gait

## 2016-11-05 NOTE — ED Notes (Signed)
Pt reports intermittent SOB (hx of asthma), intermittent CP and back pain x 8 days.denies fever, denies productive cough.  Dry cough at night.  Increased SOB with laying flat-reports feelings of 'waking up choking'.  BS clear bilaterally. nontender on palpation.

## 2016-11-06 ENCOUNTER — Ambulatory Visit: Payer: BLUE CROSS/BLUE SHIELD | Admitting: Family Medicine

## 2016-11-06 MED ORDER — NAPROXEN 250 MG PO TABS
500.0000 mg | ORAL_TABLET | Freq: Once | ORAL | Status: AC
  Administered 2016-11-06: 500 mg via ORAL
  Filled 2016-11-06: qty 2

## 2016-11-30 ENCOUNTER — Other Ambulatory Visit: Payer: Self-pay | Admitting: Family Medicine

## 2016-12-05 ENCOUNTER — Ambulatory Visit (INDEPENDENT_AMBULATORY_CARE_PROVIDER_SITE_OTHER): Payer: BLUE CROSS/BLUE SHIELD | Admitting: Family Medicine

## 2016-12-05 ENCOUNTER — Encounter: Payer: Self-pay | Admitting: Family Medicine

## 2016-12-05 VITALS — BP 149/84 | HR 94 | Ht 65.0 in | Wt 387.8 lb

## 2016-12-05 DIAGNOSIS — M79605 Pain in left leg: Secondary | ICD-10-CM

## 2016-12-05 DIAGNOSIS — M545 Low back pain, unspecified: Secondary | ICD-10-CM

## 2016-12-05 MED ORDER — IBUPROFEN 800 MG PO TABS: 800.0000 mg | ORAL_TABLET | Freq: Three times a day (TID) | ORAL | 1 refills | Status: DC | PRN

## 2016-12-05 NOTE — Progress Notes (Signed)
PCP and consultation requested by: Wanda Plump, MD  Subjective:   HPI: Patient is a 31 y.o. female here for low back pain.  6/21: Patient reports she's been struggling with low back pain for about 2 years. Pain very severe this morning so went to ED. Pain is midline, 0/10 at rest but up to 10/10 and sharp at worst. Radiates down left leg to foot. No numbness or tingling. No bowel/bladder dysfunction. Rarely takes diclofenac, tramadol for this - thinks diclofenac gave her nausea. Felt worse after trying chiropractic care. Ibuprofen takes edge off. Pain with sitting, standing, or walking a long time.  8/31: Patient reports she feels about the same overall compared to last visit. Still getting pain midline low back that goes down left leg to foot. Pain 0/10 at rest but up to 10/10 and sharp at worst. Does a lot of bending at work which worsens this. Ibuprofen helps. Prednisone also helped but didn't provide any long lasting relief. Plans to start physical therapy but hasn't started yet. No numbness or tingling. No bowel/bladder dysfunction.  Past Medical History:  Diagnosis Date  . Allergy   . Asthma   . Hypertension     Current Outpatient Prescriptions on File Prior to Visit  Medication Sig Dispense Refill  . albuterol (VENTOLIN HFA) 108 (90 Base) MCG/ACT inhaler Inhale 1-2 puffs into the lungs every 4 (four) hours as needed for wheezing or shortness of breath. 18 g 5  . beclomethasone (QVAR) 80 MCG/ACT inhaler Inhale 2 puffs into the lungs daily. 8.7 g 5  . cetirizine (ZYRTEC) 10 MG tablet Take 1 tablet (10 mg total) by mouth at bedtime. 30 tablet 5  . HYDROcodone-acetaminophen (NORCO) 5-325 MG tablet Take 1 tablet by mouth every 4 (four) hours as needed (for pain). 6 tablet 0   No current facility-administered medications on file prior to visit.     Past Surgical History:  Procedure Laterality Date  . DILATION AND CURETTAGE OF UTERUS    . TONSILLECTOMY AND  ADENOIDECTOMY      No Known Allergies  Social History   Social History  . Marital status: Divorced    Spouse name: N/A  . Number of children: 2  . Years of education: N/A   Occupational History  . 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 Smoking status: Never Smoker  . Smokeless tobacco: Never Used  . Alcohol use No  . Drug use: No  . Sexual activity: Yes    Birth control/ protection: Condom   Other Topics Concern  . Not on file   Social History Narrative   G4P2  (2 miscarriages)    Household-- pt and 2 children    Family History  Problem Relation Age of Onset  . Hyperlipidemia Father   . Diabetes Maternal Grandmother   . Breast cancer Maternal Grandmother   . Heart disease Paternal Grandmother   . Hypertension Paternal Grandmother   . Stroke Other   . Colon cancer Neg Hx     BP (!) 149/84   Pulse 94   Ht 5\' 5"  (1.651 m)   Wt (!) 387 lb 12.8 oz (175.9 kg)   BMI 64.53 kg/m   Review of Systems: See HPI above.     Objective:  Physical Exam:  Gen: NAD, comfortable in exam room  Back: No gross deformity, scoliosis. TTP midline low back pain but no bony tenderness.  No paraspinal tenderness. FROM with pain on flexion. Strength LEs  5/5 all muscle groups.   2+ MSRs in achilles tendons, trace patellar, equal bilaterally. Negative bilateral SLRs. Sensation intact to light touch bilaterally. Negative logroll bilateral hips   Assessment & Plan:  1. Low back pain radiating to left leg - consistent with lumbar radiculopathy.  No long term benefit with prednisone.  Discussed options - she would like to do physical therapy, ibuprofen, tylenol, and her muscle relaxant as needed.  Consider MRI if not improving.  F/u in 6 weeks.

## 2016-12-05 NOTE — Assessment & Plan Note (Signed)
consistent with lumbar radiculopathy.  No long term benefit with prednisone.  Discussed options - she would like to do physical therapy, ibuprofen, tylenol, and her muscle relaxant as needed.  Consider MRI if not improving.  F/u in 6 weeks.

## 2016-12-05 NOTE — Patient Instructions (Addendum)
You have lumbar radiculopathy (a pinched nerve in your low back). Ok to take tylenol for baseline pain relief (1-2 extra strength tabs 3x/day) Ibuprofen three times a day with food for pain and inflammation - I sent more of this in for you. Ok to take your skelaxin or flexeril in addition to this. Stay as active as possible. Start physical therapy - stop by downstairs on the second floor before you leave to set this up. Do home exercises on days you don't go to therapy. Strengthening of low back muscles, abdominal musculature are key for long term pain relief. If not improving, will consider further imaging (MRI). Follow up with me in 6 weeks but call sooner if you're struggling.

## 2016-12-11 ENCOUNTER — Ambulatory Visit: Payer: BLUE CROSS/BLUE SHIELD | Admitting: Physical Therapy

## 2016-12-15 ENCOUNTER — Ambulatory Visit: Payer: BLUE CROSS/BLUE SHIELD | Admitting: Family Medicine

## 2016-12-16 ENCOUNTER — Ambulatory Visit: Payer: BLUE CROSS/BLUE SHIELD | Attending: Family Medicine | Admitting: Physical Therapy

## 2016-12-19 ENCOUNTER — Encounter: Payer: Self-pay | Admitting: *Deleted

## 2016-12-19 ENCOUNTER — Encounter: Payer: Self-pay | Admitting: Certified Nurse Midwife

## 2016-12-19 ENCOUNTER — Other Ambulatory Visit (HOSPITAL_COMMUNITY)
Admission: RE | Admit: 2016-12-19 | Discharge: 2016-12-19 | Disposition: A | Discharge status: Home / Self Care | Payer: BLUE CROSS/BLUE SHIELD | Source: Ambulatory Visit | Attending: Certified Nurse Midwife | Admitting: Certified Nurse Midwife

## 2016-12-19 ENCOUNTER — Ambulatory Visit (INDEPENDENT_AMBULATORY_CARE_PROVIDER_SITE_OTHER): Payer: BLUE CROSS/BLUE SHIELD | Admitting: Certified Nurse Midwife

## 2016-12-19 ENCOUNTER — Telehealth: Payer: Self-pay | Admitting: *Deleted

## 2016-12-19 VITALS — BP 135/82 | HR 99 | Ht 65.0 in | Wt 380.0 lb

## 2016-12-19 DIAGNOSIS — Z30430 Encounter for insertion of intrauterine contraceptive device: Secondary | ICD-10-CM | POA: Diagnosis not present

## 2016-12-19 DIAGNOSIS — Z3202 Encounter for pregnancy test, result negative: Secondary | ICD-10-CM

## 2016-12-19 DIAGNOSIS — Z124 Encounter for screening for malignant neoplasm of cervix: Secondary | ICD-10-CM

## 2016-12-19 DIAGNOSIS — Z113 Encounter for screening for infections with a predominantly sexual mode of transmission: Secondary | ICD-10-CM

## 2016-12-19 DIAGNOSIS — Z01419 Encounter for gynecological examination (general) (routine) without abnormal findings: Secondary | ICD-10-CM | POA: Diagnosis not present

## 2016-12-19 LAB — POCT URINE PREGNANCY: Preg Test, Ur: NEGATIVE

## 2016-12-19 MED ORDER — LEVONORGESTREL 19.5 MG IU IUD
1.0000 | INTRAUTERINE_SYSTEM | Freq: Once | INTRAUTERINE | Status: AC
  Administered 2016-12-19: 1 via INTRAUTERINE

## 2016-12-19 NOTE — Progress Notes (Signed)
IUD Procedure Note   DIAGNOSIS: Desires long-term, reversible contraception   PROCEDURE: IUD placement Performing Provider: Orvilla Cornwall CNM  Patient counseled prior to procedure. I explained risks and benefits of Kyleena IUD, reviewed alternative forms of contraception. Patient stated understanding and consented to continue with procedure.   LMP: 12/19/16 Pregnancy Test: Negative Lot #: [id number] Expiration Date: 02/2018   IUD type: [  ] Mirena   [  ] Paragard  [  ] Lyletta     Kyleena  PROCEDURE:  Timeout procedure was performed to ensure right patient and right site.  A bimanual exam was performed to determine the position of the uterus, retroverted. The speculum was placed. The vagina and cervix was sterilized in the usual manner and sterile technique was maintained throughout the course of the procedure. A single toothed tenaculum was not required. The depth of the uterus was sounded to 9 cm. With gentle traction on the tenaculum, the IUD was inserted to the appropriate depth and inserted without difficulty.  The string was cut to an estimated 4 cm length. Bleeding was minimal. The patient tolerated the procedure well.   Follow up: The patient tolerated the procedure well without complications.  Standard post-procedure care is explained and return precautions are given.  Orvilla Cornwall CNM

## 2016-12-19 NOTE — Progress Notes (Signed)
Patient states that she last had IC about 3 weeks ago with condom and VCF.

## 2016-12-19 NOTE — Telephone Encounter (Signed)
Patient had a question about the IUD. LM on VM to CB if needed.

## 2016-12-19 NOTE — Progress Notes (Signed)
Subjective:        Monica Cantu is a 31 y.o. female here for a routine exam.  Current complaints: heavy and painful periods.  Regular periods, lasting 4-5 days, with first two days are heavy with clots about half dollar size.  Hx of D&C.  Does not desire to be pregnant currently.  Is currently sexually active, divorced.   Works full time.  Has low back pain, usually on the left side with sciatic pains on the left side, has been to orthopedics.  Has had the pain for years, since before her children.  Has been trying to loose weight, recently tried bHCG hormonal treatment, has gastric bypass surgery scheduled for November.    Does report lower abdominal pain that comes and goes on the right side, denies any diarrhea and reports constipation.     Personal health questionnaire:  Is patient [name], have a family history of breast and/or ovarian cancer: no Is there a family history of uterine cancer diagnosed at age < 70, gastrointestinal cancer, urinary tract cancer, family member who is a Personnel officer syndrome-associated carrier: no Is the patient overweight and hypertensive, family history of diabetes, personal history of gestational diabetes, preeclampsia or PCOS: yes Is patient over 78, have PCOS,  family history of premature CHD under age 67, diabetes, smoke, have hypertension or peripheral artery disease:  yes At any time, has a partner hit, kicked or otherwise hurt or frightened you?: no Over the past 2 weeks, have you felt down, depressed or hopeless?: no Over the past 2 weeks, have you felt little interest or pleasure in doing things?:no    Gynecologic History Patient's last menstrual period was 11/22/2016. Contraception: condoms Last Pap: 02/22/16. Results were: normal Last mammogram: n/a <40.  Obstetric History OB History  Gravida Para Term Preterm AB Living  SAB TAB Ectopic Multiple Live Births  # Outcome Date GA Lbr Len/2nd Weight Sex  Delivery Anes PTL Lv  5 Term 08/05/10 [redacted]w[redacted]d  9 lb 1 oz (4.111 kg) F Vag-Spont EPI  LIV  4 SAB 2011        DEC  3 SAB 2008        DEC  2 Preterm 06/22/04 109w0d  7 lb 11 oz (3.487 kg) M Vag-Spont EPI  LIV  1 Ectopic 2004              Past Medical History:  Diagnosis Date  . Allergy   . Asthma   . Hypertension     Past Surgical History:  Procedure Laterality Date  . DILATION AND CURETTAGE OF UTERUS    . TONSILLECTOMY AND ADENOIDECTOMY       Current Outpatient Prescriptions:  .  albuterol (VENTOLIN HFA) 108 (90 Base) MCG/ACT inhaler, Inhale 1-2 puffs into the lungs every 4 (four) hours as needed for wheezing or shortness of breath., Disp: 18 g, Rfl: 5 .  beclomethasone (QVAR) 80 MCG/ACT inhaler, Inhale 2 puffs into the lungs daily., Disp: 8.7 g, Rfl: 5 .  cetirizine (ZYRTEC) 10 MG tablet, Take 1 tablet (10 mg total) by mouth at bedtime., Disp: 30 tablet, Rfl: 5 .  ibuprofen (ADVIL,MOTRIN) 800 MG tablet, Take 1 tablet (800 mg total) by mouth every 8 (eight) hours as needed., Disp: 90 tablet, Rfl: 1 No Known Allergies  Social History  Substance Use Topics  . Smoking status: Never Smoker  . Smokeless tobacco: Never  Used  . Alcohol use No    Family History  Problem Relation Age of Onset  . Hyperlipidemia Father   . Diabetes Maternal Grandmother   . Breast cancer Maternal Grandmother   . Heart disease Paternal Grandmother   . Hypertension Paternal Grandmother   . Stroke Other   . Colon cancer Neg Hx       Review of Systems  Constitutional: negative for fatigue and weight loss Respiratory: negative for cough and wheezing Cardiovascular: negative for chest pain, fatigue and palpitations Gastrointestinal: negative for abdominal pain and change in bowel habits Musculoskeletal:negative for myalgias Neurological: negative for gait problems and tremors Behavioral/Psych: negative for abusive relationship, depression Endocrine: negative for temperature intolerance     Genitourinary:negative for abnormal menstrual periods, genital lesions, hot flashes, sexual problems and vaginal discharge Integument/breast: negative for breast lump, breast tenderness, nipple discharge and skin lesion(s)    Objective:       BP 135/82   Pulse 99   Ht  (1.651 m)   Wt (!) 380 lb (172.4 kg)   LMP 11/22/2016   BMI 63.24 kg/m  General:   alert  Skin:   no rash or abnormalities  Lungs:   clear to auscultation bilaterally  Heart:   regular rate and rhythm, S1, S2 normal, no murmur, click, rub or gallop  Breasts:   normal without suspicious masses, skin or nipple changes or axillary nodes  Abdomen:  normal findings: no organomegaly, soft, non-tender and no hernia  Pelvis:  External genitalia: normal general appearance Urinary system: urethral meatus normal and bladder without fullness, nontender Vaginal: normal without tenderness, induration or masses Cervix: normal appearance Adnexa: normal bimanual exam Uterus: anteverted and non-tender, normal size   Lab Review Urine pregnancy test Labs reviewed yes Radiologic studies reviewed no  50% of 45 min visit spent on counseling and coordination of care.    Assessment & Plan    Healthy female exam.    1. Encounter for insertion of Kyleena IUD       - POCT urine pregnancy - Levonorgestrel IUD 1 Device; 1 Device by Intrauterine route once. - Cytology - PAP - Cervicovaginal ancillary only   Education reviewed: calcium supplements, depression evaluation, low fat, low cholesterol diet, safe sex/STD prevention, self breast exams, skin cancer screening and weight bearing exercise. Contraception: IUD and placed today. Follow up in: 1 month.   Meds ordered this encounter  Medications  . Levonorgestrel IUD 1 Device   Orders Placed This Encounter  Procedures  . POCT urine pregnancy

## 2016-12-23 ENCOUNTER — Other Ambulatory Visit: Payer: Self-pay | Admitting: Certified Nurse Midwife

## 2016-12-23 LAB — CYTOLOGY - PAP
DIAGNOSIS: NEGATIVE
HPV: NOT DETECTED

## 2016-12-23 LAB — CERVICOVAGINAL ANCILLARY ONLY
Bacterial vaginitis: NEGATIVE
Candida vaginitis: NEGATIVE
Chlamydia: NEGATIVE
Neisseria Gonorrhea: NEGATIVE
TRICH (WINDOWPATH): NEGATIVE

## 2017-01-05 ENCOUNTER — Encounter: Payer: Self-pay | Admitting: Certified Nurse Midwife

## 2017-01-05 ENCOUNTER — Encounter: Payer: Self-pay | Admitting: Internal Medicine

## 2017-01-06 NOTE — Telephone Encounter (Signed)
Pt has been scheduled.  °

## 2017-01-07 ENCOUNTER — Other Ambulatory Visit: Payer: Self-pay | Admitting: Certified Nurse Midwife

## 2017-01-07 DIAGNOSIS — B372 Candidiasis of skin and nail: Secondary | ICD-10-CM

## 2017-01-07 MED ORDER — NYSTATIN 100000 UNIT/GM EX POWD: Freq: Four times a day (QID) | CUTANEOUS | 12 refills | Status: DC

## 2017-01-13 ENCOUNTER — Ambulatory Visit (INDEPENDENT_AMBULATORY_CARE_PROVIDER_SITE_OTHER): Payer: BLUE CROSS/BLUE SHIELD | Admitting: Internal Medicine

## 2017-01-13 ENCOUNTER — Encounter: Payer: Self-pay | Admitting: Internal Medicine

## 2017-01-13 ENCOUNTER — Telehealth: Payer: Self-pay

## 2017-01-13 MED ORDER — LORCASERIN HCL 10 MG PO TABS: 10.0000 mg | ORAL_TABLET | Freq: Two times a day (BID) | ORAL | 1 refills | Status: DC

## 2017-01-13 NOTE — Patient Instructions (Signed)
GO TO THE FRONT DESK Schedule your next appointment for a  Check up in 6 weeks  In addition to go back to a healthier diet and try to walk at least 30 minutes a day try Belvic 1 tablet twice a day

## 2017-01-13 NOTE — Progress Notes (Signed)
Subjective:    Patient ID: Monica Cantu, female    DOB: 13-Mar-1986, 31 y.o.   MRN: [id number]  DOS:  01/13/2017 Type of visit - description : f/u Interval history: Her main complaint today is morbid obesity. She was doing okay until approximately 09-2016, had an episode of back pain, took 2 rounds of prednisone then she stopped eating healthy. Has gained a significant amount of weight in the last 2 months. Currently waiting for his insurance to approve a bariatric surgery. In the meantime, would like help with medications.   Wt Readings from Last 3 Encounters:  01/13/17 (!) 394 lb 6 oz (178.9 kg)  12/19/16 (!) 380 lb (172.4 kg)  12/05/16 (!) 387 lb 12.8 oz (175.9 kg)     Review of Systems Denies anxiety, depression or insomnia. Just had a IUD place few days ago.  Past Medical History:  Diagnosis Date  . Allergy   . Asthma   . Hypertension     Past Surgical History:  Procedure Laterality Date  . DILATION AND CURETTAGE OF UTERUS    . TONSILLECTOMY AND ADENOIDECTOMY      Social History   Social History  . Marital status: Divorced    Spouse name: N/A  . Number of children: 2  . Years of education: N/A   Occupational History  . 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 Smoking status: Never Smoker  . Smokeless tobacco: Never Used  . Alcohol use No  . Drug use: No  . Sexual activity: Yes    Partners: Male    Birth control/ protection: Condom   Other Topics Concern  . Not on file   Social History Narrative   G4P2  (2 miscarriages)    Household-- pt and 2 children      Allergies as of 01/13/2017   No Known Allergies     Medication List       Accurate as of 01/13/17  4:11 PM. Always use your most recent med list.          albuterol 108 (90 Base) MCG/ACT inhaler Commonly known as:  VENTOLIN HFA Inhale 1-2 puffs into the lungs every 4 (four) hours as needed for wheezing or shortness of breath.   beclomethasone 80  MCG/ACT inhaler Commonly known as:  QVAR Inhale 2 puffs into the lungs daily.   cetirizine 10 MG tablet Commonly known as:  ZYRTEC Take 1 tablet (10 mg total) by mouth at bedtime.   ibuprofen 800 MG tablet Commonly known as:  ADVIL,MOTRIN Take 1 tablet (800 mg total) by mouth every 8 (eight) hours as needed.   nystatin powder Commonly known as:  MYCOSTATIN/NYSTOP Apply topically 4 (four) times daily.          Objective:   Physical Exam BP 124/68 (BP Location: Left Wrist, Patient Position: Sitting, Cuff Size: Small)   Pulse (!) 107   Temp 98.7 F (37.1 C) (Oral)   Resp 14   Ht  (1.651 m)   Wt (!) 394 lb 6 oz (178.9 kg)   LMP 12/19/2016 (Exact Date)   SpO2 97%   BMI 65.63 kg/m  General:   Well developed, morbidly obese lady in no distress HEENT:  Normocephalic . Face symmetric, atraumatic Lungs:  CTA B Normal respiratory effort, no intercostal retractions, no accessory muscle use. Heart: RRR,  no murmur.  No pretibial edema bilaterally  Skin: Not pale. Not jaundice Neurologic:  alert & oriented X3.  Speech normal, gait appropriate for age and unassisted Psych--  Cognition and judgment appear intact.  Cooperative with normal attention span and concentration.  Behavior appropriate. No anxious or depressed appearing.      Assessment & Plan:   Assessment Prediabetes: A1c 6.0 05-2015 Elevated BP w/o dx of HTN Thyromegaly-nodules (see [place] 04-2015), bx rx per endo : (-) 06-2016 Asthma; Allergies Morbid obesity H/o fibroids  Birth control: IUD 12-2016  PLAN:  Morbidly obese: Pt  has gained a significant amount of weight, 5 months ago she was 367 pounds, today 394, she reports a sudden increase in weight in the last 2 months. She was taking a supplement from [employer] but she stopped, was not able to afford it anymore. She thinks it was helping. She is waiting for the approval of her bariatric surgery. Request a medication. H/o Thyroid nodules, will stay away  from [facility] for now, I recommend Belviq 1 tablet twice a day, prescription written. Extensive discussion about the need to go back to a healthier diet to promote weight loss as well as increased physical activity including taking a walk 15-30 minutes daily. RTC in 6 weeks unless she decided to see one of the doctors @ the bariatric clinic.

## 2017-01-13 NOTE — Progress Notes (Signed)
Pre visit review using our clinic review tool, if applicable. No additional management support is needed unless otherwise documented below in the visit note. 

## 2017-01-13 NOTE — Telephone Encounter (Signed)
PA initiated via Covermymeds; KEY: [id number]. Awaiting determination.

## 2017-01-14 LAB — TSH: TSH: 0.87 u[IU]/mL (ref 0.35–4.50)

## 2017-01-14 LAB — HEMOGLOBIN A1C: HEMOGLOBIN A1C: 6.3 % (ref 4.6–6.5)

## 2017-01-14 NOTE — Assessment & Plan Note (Signed)
PLAN:  Morbidly obese: Pt  has gained a significant amount of weight, 5 months ago she was 367 pounds, today 394, she reports a sudden increase in weight in the last 2 months. She was taking a supplement from [employer] but she stopped, was not able to afford it anymore. She thinks it was helping. She is waiting for the approval of her bariatric surgery. Request a medication. H/o Thyroid nodules, will stay away from [facility] for now, I recommend Belviq 1 tablet twice a day, prescription written. Extensive discussion about the need to go back to a healthier diet to promote weight loss as well as increased physical activity including taking a walk 15-30 minutes daily. RTC in 6 weeks unless she decided to see one of the doctors @ the bariatric clinic.

## 2017-01-15 NOTE — Telephone Encounter (Signed)
Effective from 01/13/2017 through 07/11/2017. initial

## 2017-01-19 ENCOUNTER — Encounter: Payer: Self-pay | Admitting: Certified Nurse Midwife

## 2017-01-19 ENCOUNTER — Ambulatory Visit (INDEPENDENT_AMBULATORY_CARE_PROVIDER_SITE_OTHER): Payer: BLUE CROSS/BLUE SHIELD | Admitting: Certified Nurse Midwife

## 2017-01-19 VITALS — BP 129/87 | HR 79 | Wt 395.4 lb

## 2017-01-19 DIAGNOSIS — Z30431 Encounter for routine checking of intrauterine contraceptive device: Secondary | ICD-10-CM | POA: Diagnosis not present

## 2017-01-19 DIAGNOSIS — B372 Candidiasis of skin and nail: Secondary | ICD-10-CM

## 2017-01-19 MED ORDER — NYSTATIN-TRIAMCINOLONE 100000-0.1 UNIT/GM-% EX OINT: 1.0000 "application " | TOPICAL_OINTMENT | Freq: Two times a day (BID) | CUTANEOUS | 3 refills | Status: DC

## 2017-01-19 NOTE — Progress Notes (Signed)
Patient is in the office for string check.

## 2017-01-19 NOTE — Progress Notes (Signed)
Subjective:    Monica Cantu  who presents for contraception counseling. The patient has no complaints today. The patient is sexually active. Pertinent past medical history: none.  Likes her [name] IUD.  Is not currently bleeding with the IUD.    The information documented in the HPI was reviewed and verified.  Menstrual History: OB History    Gravida Para Term Preterm AB Living   SAB TAB Ectopic Multiple Live Births         0 1      No LMP recorded. has Mirnea IUD   Patient Active Problem List   Diagnosis Date Noted   Patient Active Problem List   Diagnosis Date Noted  . Encounter for IUD insertion 12/19/2016  . Low back pain radiating to left leg 09/30/2016  . Multinodular goiter 05/27/2016  . Annual physical exam 02/22/2016  . PCP NOTES >>>>>>>>>>>>>>>>>>>>> 05/31/2015  . Asthma 05/11/2015  . Morbid obesity (HCC) 04/24/2015  . Elevated blood pressure 04/24/2015    No past medical history on file.  Past Surgical History:  Procedure Laterality Date   Past Surgical History:  Procedure Laterality Date  . DILATION AND CURETTAGE OF UTERUS    . TONSILLECTOMY AND ADENOIDECTOMY        Current Outpatient Prescriptions:  No medication comments found. Current Outpatient Prescriptions on File Prior to Visit  Medication Sig Dispense Refill  . albuterol (VENTOLIN HFA) 108 (90 Base) MCG/ACT inhaler Inhale 1-2 puffs into the lungs every 4 (four) hours as needed for wheezing or shortness of breath. 18 g 5  . beclomethasone (QVAR) 80 MCG/ACT inhaler Inhale 2 puffs into the lungs daily. 8.7 g 5  . cetirizine (ZYRTEC) 10 MG tablet Take 1 tablet (10 mg total) by mouth at bedtime. 30 tablet 5  . ibuprofen (ADVIL,MOTRIN) 800 MG tablet Take 1 tablet (800 mg total) by mouth every 8 (eight) hours as needed. 90 tablet 1  . Levonorgestrel (KYLEENA) 19.5 MG IUD by Intrauterine route.    . Lorcaserin HCl (BELVIQ) 10 MG TABS Take 10 mg by mouth 2 (two) times daily. 60  tablet 1   No current facility-administered medications on file prior to visit.     Allergies  Allergen Reactions  No Known Allergies   Social History  Substance Use Topics   Social History   Social History  . Marital status: Divorced    Spouse name: N/A  . Number of children: 2  . Years of education: N/A   Occupational History  . 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 Smoking status: Never Smoker  . Smokeless tobacco: Never Used  . Alcohol use No  . Drug use: No  . Sexual activity: Yes    Partners: Male    Birth control/ protection: Condom   Other Topics Concern  . Not on file   Social History Narrative   G4P2  (2 miscarriages)    Household-- pt and 2 children     No family history on file.     Review of Systems Constitutional: negative for weight loss Genitourinary:negative for abnormal menstrual periods and vaginal discharge   Objective:   BP 115/77   Pulse 89   Wt 161 lb (73 kg)   BMI 25.22 kg/m    General:   alert  Skin:   no rash or abnormalities  Lungs:   clear to auscultation bilaterally  Heart:   regular  rate and rhythm, S1, S2 normal, no murmur, click, rub or gallop  Breasts:   deferred  Abdomen:  normal findings: no organomegaly, soft, non-tender and no hernia  Pelvis:  External genitalia: normal general appearance Urinary system: urethral meatus normal and bladder without fullness, nontender Vaginal: normal without tenderness, induration or masses Cervix: normal appearance, IUD strings present Adnexa: normal bimanual exam Uterus: anteverted and non-tender, normal size   Lab Review Urine pregnancy test Labs reviewed yes Radiologic studies reviewed no  50% of 15 min visit spent on counseling and coordination of care.    Assessment:    31 y.o., continuing IUD, no contraindications.   Plan:    All questions answered.   Need to obtain previous records Follow up as needed or in 1 year for annual  exam.

## 2017-02-10 ENCOUNTER — Telehealth: Payer: Self-pay | Admitting: *Deleted

## 2017-02-10 NOTE — Telephone Encounter (Signed)
[facility] & Diagnosis code "for procedure", also request to, "include the date and location of the procedure"; forwarded to provider, as I believe they are referring to Bariatric surgery that her chart does not reflect that she has had the procedure/SLS 11/06

## 2017-02-12 NOTE — Progress Notes (Signed)
Please place orders in Epic as patient is being scheduled for a pre-op appointment! Thank you! 

## 2017-02-18 NOTE — Progress Notes (Signed)
Please place orders in Epic  as patient has a pre-op appointment on 02/23/2017! Thank you!

## 2017-02-19 NOTE — Progress Notes (Signed)
Please place orders in Epic  as patient has a pre-op appointment on 02/23/2017! Thank you! 

## 2017-02-20 NOTE — Progress Notes (Signed)
EKG 11-05-16 epic  CXR 11-05-16 epic  HGBA1C 01-13-17 epic

## 2017-02-20 NOTE — Patient Instructions (Addendum)
Monica Cantu  02/20/2017   Your procedure is scheduled on: 03-02-17  [facility] to 3rd floor to  Short Stay Center at [phone].   Call this number if you have problems the morning of surgery [phone]    Remember: ONLY 1 PERSON MAY GO WITH YOU TO SHORT STAY TO GET  READY MORNING OF YOUR SURGERY.  Do not eat food or drink liquids :After Midnight.     Take these medicines the morning of surgery with A SIP OF WATER: inhalers as needed, nasal spray as needed, cetirizine (zyrtec)                                 You may not have any metal on your body including hair pins and              piercings  Do not wear jewelry, make-up, lotions, powders or perfumes, deodorant             Do not wear nail polish.  Do not shave  48 hours prior to surgery.     Do not bring valuables to the hospital. [facility] IS NOT             RESPONSIBLE   FOR VALUABLES.  Contacts, dentures or bridgework may not be worn into surgery.  Leave suitcase in the car. After surgery it may be brought to your room.                 Please read over the following fact sheets you were given: _____________________________________________________________________            [facility] Pre[facility] Before surgery, you can play an important role.  Because skin is not sterile, your skin needs to be as free of germs as possible.  You can reduce the number of germs on your skin by washing with CHG (chlorahexidine gluconate) soap before surgery.  CHG is an antiseptic cleaner which kills germs and bonds with the skin to continue killing germs even after washing. Please DO NOT use if you have an allergy to CHG or antibacterial soaps.  If your skin becomes reddened/irritated stop using the CHG and inform your nurse when you arrive at Short Stay. Do not shave (including legs and underarms) for at least 48 hours prior to the first CHG shower.  You  may shave your face/neck. Please follow these instructions carefully:  1.  Shower with CHG Soap the night before surgery and the  morning of Surgery.  2.  If you choose to wash your hair, wash your hair first as usual with your  normal  shampoo.  3.  After you shampoo, rinse your hair and body thoroughly to remove the  shampoo.                           4.  Use CHG as you would any other liquid soap.  You can apply chg directly  to the skin and wash                       Gently with a scrungie or clean washcloth.  5.  Apply the CHG Soap to your body ONLY FROM THE NECK DOWN.   Do  not use on face/ open                           Wound or open sores. Avoid contact with eyes, ears mouth and genitals (private parts).                       Wash face,  Genitals (private parts) with your normal soap.             6.  Wash thoroughly, paying special attention to the area where your surgery  will be performed.  7.  Thoroughly rinse your body with warm water from the neck down.  8.  DO NOT shower/wash with your normal soap after using and rinsing off  the CHG Soap.                9.  Pat yourself dry with a clean towel.            10.  Wear clean pajamas.            11.  Place clean sheets on your bed the night of your first shower and do not  sleep with pets. Day of Surgery : Do not apply any lotions/deodorants the morning of surgery.  Please wear clean clothes to the hospital/surgery center.  FAILURE TO FOLLOW THESE INSTRUCTIONS MAY RESULT IN THE CANCELLATION OF YOUR SURGERY PATIENT SIGNATURE_________________________________  NURSE SIGNATURE__________________________________  ________________________________________________________________________    CLEAR LIQUID DIET   Foods Allowed                                                                     Foods Excluded  Coffee and tea, regular and decaf                             liquids that you cannot  Plain Jell-O in any flavor                                              see through such as: Fruit ices (not with fruit pulp)                                     milk, soups, orange juice  Iced Popsicles                                    All solid food Carbonated beverages, regular and diet                                    Cranberry, grape and apple juices Sports drinks like Gatorade Lightly seasoned clear broth or consume(fat free) Sugar, honey syrup  Sample Menu Breakfast  Lunch                                     Supper Cranberry juice                    Beef broth                            Chicken broth Jell-O                                     Grape juice                           Apple juice Coffee or tea                        Jell-O                                      Popsicle                                                Coffee or tea                        Coffee or tea  _____________________________________________________________________

## 2017-02-23 ENCOUNTER — Encounter (HOSPITAL_COMMUNITY)
Admission: RE | Admit: 2017-02-23 | Discharge: 2017-02-23 | Disposition: A | Discharge status: Home / Self Care | Payer: BLUE CROSS/BLUE SHIELD | Source: Ambulatory Visit | Attending: Surgery | Admitting: Surgery

## 2017-02-23 ENCOUNTER — Other Ambulatory Visit: Payer: Self-pay

## 2017-02-23 ENCOUNTER — Encounter (HOSPITAL_COMMUNITY): Payer: Self-pay

## 2017-02-23 ENCOUNTER — Telehealth: Payer: Self-pay | Admitting: Registered"

## 2017-02-23 DIAGNOSIS — I10 Essential (primary) hypertension: Secondary | ICD-10-CM | POA: Diagnosis not present

## 2017-02-23 DIAGNOSIS — Z01812 Encounter for preprocedural laboratory examination: Secondary | ICD-10-CM | POA: Diagnosis present

## 2017-02-23 HISTORY — DX: Prediabetes: R73.03

## 2017-02-23 HISTORY — DX: Nausea with vomiting, unspecified: R11.2

## 2017-02-23 HISTORY — DX: Nontoxic multinodular goiter: E04.2

## 2017-02-23 HISTORY — DX: Other specified postprocedural states: Z98.890

## 2017-02-23 LAB — CBC
HEMATOCRIT: 39.3 % (ref 36.0–46.0)
Hemoglobin: 12.7 g/dL (ref 12.0–15.0)
MCH: 27 pg (ref 26.0–34.0)
MCHC: 32.3 g/dL (ref 30.0–36.0)
MCV: 83.6 fL (ref 78.0–100.0)
Platelets: 199 10*3/uL (ref 150–400)
RBC: 4.7 MIL/uL (ref 3.87–5.11)
RDW: 15.5 % (ref 11.5–15.5)
WBC: 6.2 10*3/uL (ref 4.0–10.5)

## 2017-02-23 LAB — BASIC METABOLIC PANEL
Anion gap: 9 (ref 5–15)
BUN: 14 mg/dL (ref 6–20)
CALCIUM: 9.1 mg/dL (ref 8.9–10.3)
CO2: 23 mmol/L (ref 22–32)
CREATININE: 0.81 mg/dL (ref 0.44–1.00)
Chloride: 103 mmol/L (ref 101–111)
GFR calc Af Amer: >60 mL/min (ref >60)
GFR calc non Af Amer: >60 mL/min (ref >60)
GLUCOSE: 69 mg/dL (ref 65–99)
Potassium: 4.2 mmol/L (ref 3.5–5.1)
Sodium: 135 mmol/L (ref 135–145)

## 2017-02-23 LAB — HCG, SERUM, QUALITATIVE: Preg, Serum: NEGATIVE

## 2017-02-23 NOTE — Telephone Encounter (Signed)
Pt called with questions about pre-op diet.   RD returned phone call and clarified appropriate lean proteins and non-starchy vegetable options.

## 2017-02-24 NOTE — Progress Notes (Signed)
Spoke with patient to inform of surgery time change. Patient now aware to arrive to 3rd floor short stay by 1230pm . No solid foods by mouth after midnight, continue with clear liquids midnight until 830am day of surgery. Patient verbalize understanding.

## 2017-02-25 ENCOUNTER — Ambulatory Visit: Payer: BLUE CROSS/BLUE SHIELD | Admitting: Internal Medicine

## 2017-02-25 ENCOUNTER — Ambulatory Visit: Payer: Self-pay | Admitting: Surgery

## 2017-02-25 NOTE — H&P (Signed)
Monica Cantu L Barnwell  02/11/2017 3:58 PM  Location: [place] [facility]  Patient #: [id number]  DOB: 03/22/1986  Divorced / Language: English / Race: Black or African American  Female      History of Present Illness Monica Cantu(Lavana Cantu B. Monica DeutscherMartin MD; 02/11/2017 4:27 PM)  The patient is a 31 year old female who presents for a bariatric surgery evaluation. The patient is a 31 year old female who presents for a bariatric surgery evaluation. She has known 2 of our patients (her boyfriend Monica NovelGary Cantu ) with sleeve gastrectomies and she has researched this and wants to pursue this. Her BMI is 66 and her weight today is 398. She has problems with arthritis in her right knee. She has had GERD worse with fried foods--but her UGI was "normal". She said that they evaluated her gallbladder and that was not the problem. She has tried numerous diets and currently is trying Herbalife. She has had limited success with these efforts.    I discussed sleeve gastrectomy with her in detail including complications not limited to bleeding and leaks from the staple line. She understands and accepts the risks.     She denies hx of DVT. I think that she is good candidate for sleeve gastrectomy. Awaiting insurance approval before final scheduling. Scripts given in anticipation of approval.       Problem List/Past Medical Monica Cantu(Monica Cantu, CMA; 02/11/2017 3:59 PM)  MORBID OBESITY, UNSPECIFIED OBESITY TYPE (E66.01)  Two weeks prior to surgery Go on the extremely low carb liquid diet One week prior to surgery No aspirin products. Tylenol is acceptable Stop smoking 24 hours prior to surgery No alcoholic beverages Report fever greater than 100.5 or excessive nasal drainage suggesting infection Continue bariatric preop diet Perform bowel prep if ordered Do not eat or drink anything after midnight the night before surgery Do not take any medications except those instructed by the anesthesiologist Morning of surgery Please  arrive at the hospital at least 2 hours before your scheduled surgery time. No makeup, fingernail polish or jewelry Bring insurance cards with you Bring your CPAP mask if you use this    Past Surgical History Monica Cantu(Monica R. Monica Cantu, CMA; 02/11/2017 3:59 PM)  Tonsillectomy     Diagnostic Studies History Monica Cantu(Monica R. Monica Cantu, CMA; 02/11/2017 3:59 PM)  Colonoscopy  never  Mammogram  never  Pap Smear  1-5 years ago    Allergies Monica Cantu(Monica Cantu, CMA; 02/11/2017 3:59 PM)  No Known Allergies 06/20/2016    Medication History Monica Cantu(Monica Cantu, CMA; 02/11/2017 3:59 PM)  Ibuprofen (800MG  Tablet, Oral) Active.  NuvaRing (0.12-0.015MG /24HR Ring, Vaginal) Active.  DiazePAM (2MG  Tablet, Oral) Active.  Medications Reconciled    Social History Monica Cantu(Monica Cantu, CMA; 02/11/2017 3:59 PM)  Caffeine use  Tea.  No alcohol use   No drug use   Tobacco use  Never smoker.    Family History Monica Cantu(Monica R. Monica Cantu, CMA; 02/11/2017 3:59 PM)  Arthritis  Mother.  Breast Cancer  Family Members In General.  Diabetes Mellitus  Family Members In General.  Hypertension  Family Members In General, Mother.    Pregnancy / Birth History Monica Cantu(Monica R. Monica Cantu, CMA; 02/11/2017 3:59 PM)  Age at menarche  8 years.  Contraceptive History  Intrauterine device, Oral contraceptives.  Gravida  4  Length (months) of breastfeeding  7-12  Maternal age  33-25  Para  2  Regular periods     Other Problems Monica Cantu(Monica Cantu, CMA; 02/11/2017 3:59 PM)  Asthma   Gastroesophageal Reflux  Disease     Vitals Monica Cantu(Monica Cantu CMA; 02/11/2017 3:59 PM)  02/11/2017 3:58 PM  Weight: 398.5 lb Height: 65in  Body Surface Area: 2.65 m Body Mass Index: 66.31 kg/m   Pulse: 72 (Regular)   BP: 132/88 (Sitting, Left Arm, Standard)              Physical Exam (Oluwademilade Kellett B. Monica DeutscherMartin MD; 02/11/2017 4:28 PM)  General  Note: Large AAF NAD  HEENT unremarkable  Neck supple   Chest clear  Heart SR without murmurs  Abdomen nontender  Ext FROM  Neuro alert and oriented x 3          Assessment & Plan Monica Cantu(Monica Cantu B. Monica DeutscherMartin MD; 02/11/2017 4:28 PM)  MORBID OBESITY, UNSPECIFIED OBESITY TYPE (E66.01)  Story: Two weeks prior to surgery  Go on the extremely low carb liquid diet  One week prior to surgery  No aspirin products. Tylenol is acceptable  Stop smoking  24 hours prior to surgery  No alcoholic beverages  Report fever greater than 100.5 or excessive nasal drainage suggesting infection  Continue bariatric preop diet  Perform bowel prep if ordered  Do not eat or drink anything after midnight the night before surgery  Do not take any medications except those instructed by the anesthesiologist  Morning of surgery  Please arrive at the hospital at least 2 hours before your scheduled surgery time.  No makeup, fingernail polish or jewelry  Bring insurance cards with you  Bring your CPAP mask if you use this  Impression: Morbidly obese AAF who I think is a good candidate for sleeve gastrectomy  Matt B. Monica DeutscherMartin, MD, FACS

## 2017-02-25 NOTE — H&P (View-Only) (Signed)
Monica Cantu  02/11/2017 3:58 PM  Location: [place] [facility]  Patient #: [id number]  DOB: 05/17/1985  Divorced / Language: English / Race: Black or African American  Female      History of Present Illness (Yassen Kinnett B. Nisaiah Bechtol MD; 02/11/2017 4:27 PM)  The patient is a 31 year old female who presents for a bariatric surgery evaluation. The patient is a 31 year old female who presents for a bariatric surgery evaluation. She has known 2 of our patients (her boyfriend Monica Cantu ) with sleeve gastrectomies and she has researched this and wants to pursue this.  Her BMI is 66 and her weight today is 398.  She has problems with arthritis in her right knee.  She has had GERD worse with fried foods--but her UGI was "normal".  She said that they evaluated her gallbladder and that was not the problem.  She has tried numerous diets and currently is trying Herbalife.  She has had limited success with these efforts.    I discussed sleeve gastrectomy with her in detail including complications not limited to bleeding and leaks from the staple line.  She understands and accepts the risks.      She denies hx of DVT.  I think that she is good candidate for sleeve gastrectomy.  Awaiting insurance approval before final scheduling.  Scripts given in anticipation of approval.        Problem List/Past Medical (Michelle R. Brooks, CMA; 02/11/2017 3:59 PM)  MORBID OBESITY, UNSPECIFIED OBESITY TYPE (E66.01)   Two weeks prior to surgery Go on the extremely low carb liquid diet One week prior to surgery No aspirin products. Tylenol is acceptable Stop smoking 24 hours prior to surgery No alcoholic beverages Report fever greater than 100.5 or excessive nasal drainage suggesting infection Continue bariatric preop diet Perform bowel prep if ordered Do not eat or drink anything after midnight the night before surgery Do not take any medications except those instructed by the anesthesiologist Morning of surgery Please  arrive at the hospital at least 2 hours before your scheduled surgery time. No makeup, fingernail polish or jewelry Bring insurance cards with you Bring your CPAP mask if you use this    Past Surgical History (Michelle R. Brooks, CMA; 02/11/2017 3:59 PM)  Tonsillectomy      Diagnostic Studies History (Michelle R. Brooks, CMA; 02/11/2017 3:59 PM)  Colonoscopy   never  Mammogram   never  Pap Smear   1-5 years ago    Allergies (Michelle R. Brooks, CMA; 02/11/2017 3:59 PM)  No Known Allergies  06/20/2016    Medication History (Michelle R. Brooks, CMA; 02/11/2017 3:59 PM)  Ibuprofen  (800MG Tablet, Oral) Active.  NuvaRing  (0.12-0.015MG/24HR Ring, Vaginal) Active.  DiazePAM  (2MG Tablet, Oral) Active.  Medications Reconciled     Social History (Michelle R. Brooks, CMA; 02/11/2017 3:59 PM)  Caffeine use   Tea.  No alcohol use    No drug use    Tobacco use   Never smoker.    Family History (Michelle R. Brooks, CMA; 02/11/2017 3:59 PM)  Arthritis   Mother.  Breast Cancer   Family Members In General.  Diabetes Mellitus   Family Members In General.  Hypertension   Family Members In General, Mother.    Pregnancy / Birth History (Michelle R. Brooks, CMA; 02/11/2017 3:59 PM)  Age at menarche   8 years.  Contraceptive History   Intrauterine device, Oral contraceptives.  Gravida   4  Length (months) of breastfeeding   7-12  Maternal age   21-25  Para   2  Regular periods      Other Problems (Michelle R. Brooks, CMA; 02/11/2017 3:59 PM)  Asthma    Gastroesophageal Reflux   Disease     Vitals [name](Michelle R. Brooks CMA; 02/11/2017 3:59 PM)  02/11/2017 3:58 PM  Weight: 398.5 lb Height: 65in  Body Surface Area: 2.65 m Body Mass Index: 66.31 kg/m   Pulse: 72 (Regular)   BP: 132/88 (Sitting, Left Arm, Standard)              Physical Exam (Shaylene Paganelli B. Daphine DeutscherMartin MD; 02/11/2017 4:28 PM)  General  Note: Large AAF NAD  HEENT unremarkable  Neck supple   Chest clear  Heart SR without murmurs  Abdomen nontender  Ext FROM  Neuro alert and oriented x 3          Assessment & Plan Molli Hazard(Avaleigh Decuir B. Daphine DeutscherMartin MD; 02/11/2017 4:28 PM)  MORBID OBESITY, UNSPECIFIED OBESITY TYPE (E66.01)  Story: Two weeks prior to surgery  Go on the extremely low carb liquid diet  One week prior to surgery  No aspirin products. Tylenol is acceptable  Stop smoking  24 hours prior to surgery  No alcoholic beverages  Report fever greater than 100.5 or excessive nasal drainage suggesting infection  Continue bariatric preop diet  Perform bowel prep if ordered  Do not eat or drink anything after midnight the night before surgery  Do not take any medications except those instructed by the anesthesiologist  Morning of surgery  Please arrive at the hospital at least 2 hours before your scheduled surgery time.  No makeup, fingernail polish or jewelry  Bring insurance cards with you  Bring your CPAP mask if you use this  Impression: Morbidly obese AAF who I think is a good candidate for sleeve gastrectomy  Matt B. Daphine DeutscherMartin, MD, FACS

## 2017-03-02 ENCOUNTER — Encounter (HOSPITAL_COMMUNITY)
Admission: RE | Disposition: A | Discharge status: Home / Self Care | Payer: Self-pay | Source: Ambulatory Visit | Attending: Surgery

## 2017-03-02 ENCOUNTER — Inpatient Hospital Stay (HOSPITAL_COMMUNITY)
Admission: RE | Admit: 2017-03-02 | Discharge: 2017-03-03 | DRG: 621 | Disposition: A | Discharge status: Home / Self Care | Payer: BLUE CROSS/BLUE SHIELD | Source: Ambulatory Visit | Attending: Surgery | Admitting: Surgery

## 2017-03-02 ENCOUNTER — Inpatient Hospital Stay (HOSPITAL_COMMUNITY): Payer: BLUE CROSS/BLUE SHIELD | Admitting: Certified Registered Nurse Anesthetist

## 2017-03-02 ENCOUNTER — Encounter (HOSPITAL_COMMUNITY): Payer: Self-pay | Admitting: *Deleted

## 2017-03-02 ENCOUNTER — Other Ambulatory Visit: Payer: Self-pay

## 2017-03-02 DIAGNOSIS — K219 Gastro-esophageal reflux disease without esophagitis: Secondary | ICD-10-CM | POA: Diagnosis present

## 2017-03-02 DIAGNOSIS — Z6841 Body Mass Index (BMI) 40.0 and over, adult: Secondary | ICD-10-CM | POA: Diagnosis not present

## 2017-03-02 DIAGNOSIS — Z793 Long term (current) use of hormonal contraceptives: Secondary | ICD-10-CM | POA: Diagnosis not present

## 2017-03-02 DIAGNOSIS — J45909 Unspecified asthma, uncomplicated: Secondary | ICD-10-CM | POA: Diagnosis present

## 2017-03-02 DIAGNOSIS — M1711 Unilateral primary osteoarthritis, right knee: Secondary | ICD-10-CM | POA: Diagnosis present

## 2017-03-02 DIAGNOSIS — Z7951 Long term (current) use of inhaled steroids: Secondary | ICD-10-CM | POA: Diagnosis not present

## 2017-03-02 DIAGNOSIS — Z9884 Bariatric surgery status: Secondary | ICD-10-CM

## 2017-03-02 HISTORY — PX: LAPAROSCOPIC GASTRIC SLEEVE RESECTION: SHX5895

## 2017-03-02 LAB — CBC
HEMATOCRIT: 38.6 % (ref 36.0–46.0)
Hemoglobin: 12.5 g/dL (ref 12.0–15.0)
MCH: 27 pg (ref 26.0–34.0)
MCHC: 32.4 g/dL (ref 30.0–36.0)
MCV: 83.4 fL (ref 78.0–100.0)
PLATELETS: 176 10*3/uL (ref 150–400)
RBC: 4.63 MIL/uL (ref 3.87–5.11)
RDW: 15.7 % — AB (ref 11.5–15.5)
WBC: 10.9 10*3/uL — ABNORMAL HIGH (ref 4.0–10.5)

## 2017-03-02 LAB — CREATININE, SERUM
Creatinine, Ser: 0.96 mg/dL (ref 0.44–1.00)
GFR calc Af Amer: >60 mL/min (ref >60)
GFR calc non Af Amer: >60 mL/min (ref >60)

## 2017-03-02 SURGERY — GASTRECTOMY, SLEEVE, LAPAROSCOPIC
Anesthesia: General | Site: Abdomen

## 2017-03-02 MED ORDER — HYDRALAZINE HCL 20 MG/ML IJ SOLN: 10.0000 mg | INTRAMUSCULAR | Status: DC | PRN

## 2017-03-02 MED ORDER — EPHEDRINE SULFATE-NACL 50-0.9 MG/10ML-% IV SOSY
PREFILLED_SYRINGE | INTRAVENOUS | Status: DC | PRN
  Administered 2017-03-02: 10 mg via INTRAVENOUS

## 2017-03-02 MED ORDER — ONDANSETRON HCL 4 MG/2ML IJ SOLN: 4.0000 mg | Freq: Four times a day (QID) | INTRAMUSCULAR | Status: DC | PRN

## 2017-03-02 MED ORDER — HYDROMORPHONE HCL 1 MG/ML IJ SOLN
0.2500 mg | INTRAMUSCULAR | Status: DC | PRN
  Administered 2017-03-02 (×2): 0.5 mg via INTRAVENOUS

## 2017-03-02 MED ORDER — LACTATED RINGERS IR SOLN
Status: DC | PRN
  Administered 2017-03-02: 1000 mL

## 2017-03-02 MED ORDER — MIDAZOLAM HCL 5 MG/5ML IJ SOLN
INTRAMUSCULAR | Status: DC | PRN
  Administered 2017-03-02: 2 mg via INTRAVENOUS

## 2017-03-02 MED ORDER — ONDANSETRON HCL 4 MG/2ML IJ SOLN
INTRAMUSCULAR | Status: AC
  Filled 2017-03-02: qty 2

## 2017-03-02 MED ORDER — HEPARIN SODIUM (PORCINE) 5000 UNIT/ML IJ SOLN
5000.0000 [IU] | INTRAMUSCULAR | Status: AC
  Administered 2017-03-02: 5000 [IU] via SUBCUTANEOUS
  Filled 2017-03-02: qty 1

## 2017-03-02 MED ORDER — DEXAMETHASONE SODIUM PHOSPHATE 10 MG/ML IJ SOLN
INTRAMUSCULAR | Status: AC
  Filled 2017-03-02: qty 1

## 2017-03-02 MED ORDER — CELECOXIB 200 MG PO CAPS
400.0000 mg | ORAL_CAPSULE | ORAL | Status: AC
  Administered 2017-03-02: 400 mg via ORAL
  Filled 2017-03-02: qty 2

## 2017-03-02 MED ORDER — PANTOPRAZOLE SODIUM 40 MG IV SOLR
40.0000 mg | Freq: Every day | INTRAVENOUS | Status: DC
  Administered 2017-03-02: 40 mg via INTRAVENOUS
  Filled 2017-03-02 (×2): qty 40

## 2017-03-02 MED ORDER — 0.9 % SODIUM CHLORIDE (POUR BTL) OPTIME
TOPICAL | Status: DC | PRN
  Administered 2017-03-02: 1000 mL

## 2017-03-02 MED ORDER — ROCURONIUM BROMIDE 50 MG/5ML IV SOSY
PREFILLED_SYRINGE | INTRAVENOUS | Status: AC
  Filled 2017-03-02: qty 5

## 2017-03-02 MED ORDER — SUGAMMADEX SODIUM 500 MG/5ML IV SOLN
INTRAVENOUS | Status: AC
  Filled 2017-03-02: qty 5

## 2017-03-02 MED ORDER — SUCCINYLCHOLINE CHLORIDE 200 MG/10ML IV SOSY
PREFILLED_SYRINGE | INTRAVENOUS | Status: DC | PRN
  Administered 2017-03-02: 160 mg via INTRAVENOUS

## 2017-03-02 MED ORDER — SCOPOLAMINE 1 MG/3DAYS TD PT72
1.0000 | MEDICATED_PATCH | TRANSDERMAL | Status: DC
  Administered 2017-03-02: 1.5 mg via TRANSDERMAL
  Filled 2017-03-02: qty 1

## 2017-03-02 MED ORDER — FENTANYL CITRATE (PF) 100 MCG/2ML IJ SOLN
INTRAMUSCULAR | Status: AC
  Filled 2017-03-02: qty 2

## 2017-03-02 MED ORDER — FENTANYL CITRATE (PF) 100 MCG/2ML IJ SOLN
INTRAMUSCULAR | Status: DC | PRN
  Administered 2017-03-02 (×5): 50 ug via INTRAVENOUS

## 2017-03-02 MED ORDER — BUPIVACAINE LIPOSOME 1.3 % IJ SUSP
20.0000 mL | Freq: Once | INTRAMUSCULAR | Status: AC
  Administered 2017-03-02: 20 mL
  Filled 2017-03-02: qty 20

## 2017-03-02 MED ORDER — PROMETHAZINE HCL 25 MG/ML IJ SOLN
12.5000 mg | INTRAMUSCULAR | Status: DC | PRN
  Administered 2017-03-02 – 2017-03-03 (×2): 12.5 mg via INTRAVENOUS
  Filled 2017-03-02 (×2): qty 1

## 2017-03-02 MED ORDER — PHENYLEPHRINE 40 MCG/ML (10ML) SYRINGE FOR IV PUSH (FOR BLOOD PRESSURE SUPPORT)
PREFILLED_SYRINGE | INTRAVENOUS | Status: AC
  Filled 2017-03-02: qty 10

## 2017-03-02 MED ORDER — PROPOFOL 10 MG/ML IV BOLUS
INTRAVENOUS | Status: AC
  Filled 2017-03-02: qty 40

## 2017-03-02 MED ORDER — ROCURONIUM BROMIDE 50 MG/5ML IV SOSY
PREFILLED_SYRINGE | INTRAVENOUS | Status: AC
  Filled 2017-03-02: qty 10

## 2017-03-02 MED ORDER — CHLORHEXIDINE GLUCONATE CLOTH 2 % EX PADS: 6.0000 | MEDICATED_PAD | Freq: Once | CUTANEOUS | Status: DC

## 2017-03-02 MED ORDER — HYDROMORPHONE HCL 1 MG/ML IJ SOLN
INTRAMUSCULAR | Status: AC
  Filled 2017-03-02: qty 2

## 2017-03-02 MED ORDER — LIDOCAINE 2% (20 MG/ML) 5 ML SYRINGE
INTRAMUSCULAR | Status: DC | PRN
  Administered 2017-03-02: 1.5 mg/kg/h via INTRAVENOUS

## 2017-03-02 MED ORDER — HEPARIN SODIUM (PORCINE) 5000 UNIT/ML IJ SOLN
5000.0000 [IU] | Freq: Three times a day (TID) | INTRAMUSCULAR | Status: DC
Start: 2017-03-02 — End: 2017-03-03
  Administered 2017-03-02 – 2017-03-03 (×3): 5000 [IU] via SUBCUTANEOUS
  Filled 2017-03-02 (×3): qty 1

## 2017-03-02 MED ORDER — OXYCODONE HCL 5 MG/5ML PO SOLN: 5.0000 mg | Freq: Once | ORAL | Status: DC | PRN

## 2017-03-02 MED ORDER — PREMIER PROTEIN SHAKE: 2.0000 [oz_av] | ORAL | Status: DC

## 2017-03-02 MED ORDER — CEFOTETAN DISODIUM-DEXTROSE 2-2.08 GM-%(50ML) IV SOLR
2.0000 g | INTRAVENOUS | Status: AC
  Administered 2017-03-02: 2 g via INTRAVENOUS
  Filled 2017-03-02: qty 50

## 2017-03-02 MED ORDER — ALBUMIN HUMAN 5 % IV SOLN
INTRAVENOUS | Status: AC
  Filled 2017-03-02: qty 250

## 2017-03-02 MED ORDER — KCL IN DEXTROSE-NACL 20-5-0.45 MEQ/L-%-% IV SOLN
INTRAVENOUS | Status: DC
  Administered 2017-03-02: 1000 mL via INTRAVENOUS
  Administered 2017-03-03: 09:00:00 via INTRAVENOUS
  Filled 2017-03-02 (×4): qty 1000

## 2017-03-02 MED ORDER — ONDANSETRON HCL 4 MG/2ML IJ SOLN
INTRAMUSCULAR | Status: DC | PRN
  Administered 2017-03-02: 4 mg via INTRAVENOUS

## 2017-03-02 MED ORDER — PROPOFOL 10 MG/ML IV BOLUS
INTRAVENOUS | Status: DC | PRN
  Administered 2017-03-02: 200 mg via INTRAVENOUS

## 2017-03-02 MED ORDER — GABAPENTIN 300 MG PO CAPS
300.0000 mg | ORAL_CAPSULE | ORAL | Status: AC
  Administered 2017-03-02: 300 mg via ORAL
  Filled 2017-03-02: qty 1

## 2017-03-02 MED ORDER — MORPHINE SULFATE (PF) 2 MG/ML IV SOLN
1.0000 mg | INTRAVENOUS | Status: DC | PRN
  Administered 2017-03-02: 2 mg via INTRAVENOUS
  Filled 2017-03-02 (×2): qty 1

## 2017-03-02 MED ORDER — OXYCODONE HCL 5 MG/5ML PO SOLN
5.0000 mg | ORAL | Status: DC | PRN
  Administered 2017-03-03 (×2): 5 mg via ORAL
  Filled 2017-03-02 (×2): qty 5

## 2017-03-02 MED ORDER — ROCURONIUM BROMIDE 10 MG/ML (PF) SYRINGE
PREFILLED_SYRINGE | INTRAVENOUS | Status: DC | PRN
  Administered 2017-03-02: 50 mg via INTRAVENOUS
  Administered 2017-03-02: 30 mg via INTRAVENOUS
  Administered 2017-03-02: 10 mg via INTRAVENOUS
  Administered 2017-03-02: 20 mg via INTRAVENOUS

## 2017-03-02 MED ORDER — ONDANSETRON HCL 4 MG/2ML IJ SOLN
4.0000 mg | INTRAMUSCULAR | Status: DC | PRN
  Administered 2017-03-02 – 2017-03-03 (×2): 4 mg via INTRAVENOUS
  Filled 2017-03-02 (×2): qty 2

## 2017-03-02 MED ORDER — DEXAMETHASONE SODIUM PHOSPHATE 4 MG/ML IJ SOLN
4.0000 mg | INTRAMUSCULAR | Status: AC
  Administered 2017-03-02: 10 mg via INTRAVENOUS

## 2017-03-02 MED ORDER — PHENYLEPHRINE 40 MCG/ML (10ML) SYRINGE FOR IV PUSH (FOR BLOOD PRESSURE SUPPORT)
PREFILLED_SYRINGE | INTRAVENOUS | Status: DC | PRN
  Administered 2017-03-02: 120 ug via INTRAVENOUS
  Administered 2017-03-02 (×2): 80 ug via INTRAVENOUS

## 2017-03-02 MED ORDER — MIDAZOLAM HCL 2 MG/2ML IJ SOLN
INTRAMUSCULAR | Status: AC
  Filled 2017-03-02: qty 2

## 2017-03-02 MED ORDER — OXYCODONE HCL 5 MG PO TABS: 5.0000 mg | ORAL_TABLET | Freq: Once | ORAL | Status: DC | PRN

## 2017-03-02 MED ORDER — ACETAMINOPHEN 160 MG/5ML PO SOLN
650.0000 mg | ORAL | Status: DC | PRN
  Administered 2017-03-03 (×2): 650 mg via ORAL
  Filled 2017-03-02 (×2): qty 20.3

## 2017-03-02 MED ORDER — PHENYLEPHRINE HCL 10 MG/ML IJ SOLN
INTRAMUSCULAR | Status: AC
  Filled 2017-03-02: qty 2

## 2017-03-02 MED ORDER — LIDOCAINE 2% (20 MG/ML) 5 ML SYRINGE
INTRAMUSCULAR | Status: AC
  Filled 2017-03-02: qty 5

## 2017-03-02 MED ORDER — ALBUMIN HUMAN 5 % IV SOLN
INTRAVENOUS | Status: DC | PRN
  Administered 2017-03-02: 17:00:00 via INTRAVENOUS

## 2017-03-02 MED ORDER — EPHEDRINE 5 MG/ML INJ
INTRAVENOUS | Status: AC
  Filled 2017-03-02: qty 10

## 2017-03-02 MED ORDER — LIDOCAINE 2% (20 MG/ML) 5 ML SYRINGE
INTRAMUSCULAR | Status: DC | PRN
  Administered 2017-03-02: 60 mg via INTRAVENOUS

## 2017-03-02 MED ORDER — SUGAMMADEX SODIUM 200 MG/2ML IV SOLN
INTRAVENOUS | Status: DC | PRN
  Administered 2017-03-02: 500 mg via INTRAVENOUS

## 2017-03-02 MED ORDER — SUCCINYLCHOLINE CHLORIDE 200 MG/10ML IV SOSY
PREFILLED_SYRINGE | INTRAVENOUS | Status: AC
  Filled 2017-03-02: qty 10

## 2017-03-02 MED ORDER — PHENYLEPHRINE HCL 10 MG/ML IJ SOLN
INTRAVENOUS | Status: DC | PRN
  Administered 2017-03-02: 25 ug/min via INTRAVENOUS

## 2017-03-02 MED ORDER — APREPITANT 40 MG PO CAPS
40.0000 mg | ORAL_CAPSULE | ORAL | Status: AC
  Administered 2017-03-02: 40 mg via ORAL
  Filled 2017-03-02: qty 1

## 2017-03-02 MED ORDER — ACETAMINOPHEN 500 MG PO TABS
1000.0000 mg | ORAL_TABLET | ORAL | Status: AC
  Administered 2017-03-02: 1000 mg via ORAL
  Filled 2017-03-02: qty 2

## 2017-03-02 MED ORDER — LACTATED RINGERS IV SOLN
INTRAVENOUS | Status: DC
Start: 2017-03-02 — End: 2017-03-02
  Administered 2017-03-02 (×3): via INTRAVENOUS

## 2017-03-02 SURGICAL SUPPLY — 56 items
APPLICATOR COTTON TIP 6IN STRL (MISCELLANEOUS) IMPLANT
APPLIER CLIP 5 13 M/L LIGAMAX5 (MISCELLANEOUS)
APPLIER CLIP ROT 10 11.4 M/L (STAPLE)
APPLIER CLIP ROT 13.4 12 LRG (CLIP)
BLADE SURG 15 STRL LF DISP TIS (BLADE) ×1 IMPLANT
BLADE SURG 15 STRL SS (BLADE) ×1
CABLE HIGH FREQUENCY MONO STRZ (ELECTRODE) ×2 IMPLANT
CLIP APPLIE 5 13 M/L LIGAMAX5 (MISCELLANEOUS) IMPLANT
CLIP APPLIE ROT 10 11.4 M/L (STAPLE) IMPLANT
CLIP APPLIE ROT 13.4 12 LRG (CLIP) IMPLANT
DERMABOND ADVANCED (GAUZE/BANDAGES/DRESSINGS) ×1
DERMABOND ADVANCED .7 DNX12 (GAUZE/BANDAGES/DRESSINGS) ×1 IMPLANT
DEVICE PMI PUNCTURE CLOSURE (MISCELLANEOUS) ×2 IMPLANT
DEVICE SUT QUICK LOAD TK 5 (STAPLE) IMPLANT
DEVICE SUT TI-KNOT TK 5X26 (MISCELLANEOUS) IMPLANT
DEVICE SUTURE ENDOST 10MM (ENDOMECHANICALS) IMPLANT
DISSECTOR BLUNT TIP ENDO 5MM (MISCELLANEOUS) IMPLANT
ELECT REM PT RETURN 15FT ADLT (MISCELLANEOUS) ×2 IMPLANT
GAUZE SPONGE 4X4 12PLY STRL (GAUZE/BANDAGES/DRESSINGS) IMPLANT
GLOVE BIOGEL M 8.0 STRL (GLOVE) ×2 IMPLANT
GOWN STRL REUS W/TWL XL LVL3 (GOWN DISPOSABLE) ×8 IMPLANT
HANDLE STAPLE EGIA 4 XL (STAPLE) ×2 IMPLANT
HOVERMATT SINGLE USE (MISCELLANEOUS) ×2 IMPLANT
KIT BASIN OR (CUSTOM PROCEDURE TRAY) ×2 IMPLANT
MARKER SKIN DUAL TIP RULER LAB (MISCELLANEOUS) ×2 IMPLANT
NEEDLE SPNL 22GX3.5 QUINCKE BK (NEEDLE) ×2 IMPLANT
PACK UNIVERSAL I (CUSTOM PROCEDURE TRAY) ×2 IMPLANT
RELOAD TRI 45 ART MED THCK BLK (STAPLE) ×2 IMPLANT
RELOAD TRI 45 ART MED THCK PUR (STAPLE) IMPLANT
RELOAD TRI 60 ART MED THCK BLK (STAPLE) ×2 IMPLANT
RELOAD TRI 60 ART MED THCK PUR (STAPLE) ×8 IMPLANT
SCISSORS LAP 5X45 EPIX DISP (ENDOMECHANICALS) ×2 IMPLANT
SET IRRIG TUBING LAPAROSCOPIC (IRRIGATION / IRRIGATOR) ×2 IMPLANT
SHEARS HARMONIC ACE PLUS 45CM (MISCELLANEOUS) ×2 IMPLANT
SLEEVE ADV FIXATION 5X100MM (TROCAR) ×4 IMPLANT
SLEEVE GASTRECTOMY 36FR VISIGI (MISCELLANEOUS) ×2 IMPLANT
SOLUTION ANTI FOG 6CC (MISCELLANEOUS) ×2 IMPLANT
SPONGE LAP 18X18 X RAY DECT (DISPOSABLE) ×2 IMPLANT
STAPLER VISISTAT 35W (STAPLE) ×2 IMPLANT
SUT MNCRL AB 4-0 PS2 18 (SUTURE) ×2 IMPLANT
SUT SURGIDAC NAB ES-9 0 48 120 (SUTURE) IMPLANT
SUT VIC AB 4-0 SH 18 (SUTURE) ×2 IMPLANT
SUT VICRYL 0 TIES 12 18 (SUTURE) ×2 IMPLANT
SYR 10ML ECCENTRIC (SYRINGE) ×2 IMPLANT
SYR 20CC LL (SYRINGE) ×2 IMPLANT
SYR 50ML LL SCALE MARK (SYRINGE) ×2 IMPLANT
TOWEL OR 17X26 10 PK STRL BLUE (TOWEL DISPOSABLE) ×4 IMPLANT
TOWEL OR NON WOVEN STRL DISP B (DISPOSABLE) ×2 IMPLANT
TRAY FOLEY W/METER SILVER 16FR (SET/KITS/TRAYS/PACK) IMPLANT
TROCAR ADV FIXATION 5X100MM (TROCAR) ×2 IMPLANT
TROCAR BLADELESS 15MM (ENDOMECHANICALS) ×2 IMPLANT
TROCAR BLADELESS OPT 5 100 (ENDOMECHANICALS) ×2 IMPLANT
TUBE CALIBRATION LAPBAND (TUBING) IMPLANT
TUBING CONNECTING 10 (TUBING) ×4 IMPLANT
TUBING ENDO SMARTCAP (MISCELLANEOUS) ×2 IMPLANT
TUBING INSUF HEATED (TUBING) ×2 IMPLANT

## 2017-03-02 NOTE — Transfer of Care (Signed)
Immediate Anesthesia Transfer of Care Note  Patient: Monica Cantu  Procedure(s) Performed: LAPAROSCOPIC GASTRIC SLEEVE RESECTION, UPPER ENDO (N/A Abdomen)  Patient Location: PACU  Anesthesia Type:General  Level of Consciousness: drowsy  Airway & Oxygen Therapy: Patient Spontanous Breathing and Patient connected to face mask  Post-op Assessment: Report given to RN and Post -op Vital signs reviewed and stable  Post vital signs: Reviewed and stable  Last Vitals:  Vitals:   03/02/17 1148  BP: (!) 182/86  Pulse: 81  Resp: 16  Temp: 36.9 C  SpO2: 98%    Last Pain:  Vitals:   03/02/17 1148  TempSrc: Oral      Patients Stated Pain Goal: 3 (03/02/17 1226)  Complications: No apparent anesthesia complications

## 2017-03-02 NOTE — Progress Notes (Signed)
No bari-beds available in [facility].

## 2017-03-02 NOTE — Interval H&P Note (Signed)
History and Physical Interval Note:  03/02/2017 3:40 PM  Monica Cantu  has presented today for surgery, with the diagnosis of Morbid Obesity, HTN, Asthma  The various methods of treatment have been discussed with the patient and family. After consideration of risks, benefits and other options for treatment, the patient has consented to  Procedure(s): LAPAROSCOPIC GASTRIC SLEEVE RESECTION, UPPER ENDO (N/A) as a surgical intervention .  The patient's history has been reviewed, patient examined, no change in status, stable for surgery.  I have reviewed the patient's chart and labs.  Questions were answered to the patient's satisfaction.     Valarie MerinoMatthew B Krysti Hickling

## 2017-03-02 NOTE — Op Note (Signed)
Surgeon: Wenda LowMatt Waldemar Siegel, MD, FACS  Asst:  Jaclynn GuarneriBen Hoxworth, MD, FACS  Anes:  General endotracheal  Procedure: Laparoscopic sleeve gastrectomy and upper endoscopy  Diagnosis: Morbid obesity  Complications: none  EBL:   minimal cc  Description of Procedure:  The patient was take to OR 2 and given general anesthesia.  The abdomen was prepped with PCMX and draped sterilely.  A timeout was performed.  Access to the abdomen was achieved with an Optiview through the left upper quadrant.  Following insufflation, the state of the abdomen was found to be free of adhesions.  The ViSiGi 36Fr tube was inserted to deflate the stomach and was pulled back into the esophagus.    The pylorus was identified and we measured 5 cm back and marked the antrum.  At that point we began dissection to take down the greater curvature of the stomach using the Harmonic scalpel.  This dissection was taken all the way up to the left crus.  Posterior attachments of the stomach were also taken down.    The ViSiGi tube was then passed into the antrum and suction applied so that it was snug along the lessor curvature.  The "crow's foot" or incisura was identified.  The sleeve gastrectomy was begun using the [employer] platform stapler beginning with a 4.5 cm black load with TRS followed by a 6 cm black with TRS then multiple purple loads with TRS.  When the sleeve was complete the tube was taken off suction and insufflated briefly.  The tube was withdrawn.  Upper endoscopy was then performed by Dr. Johna SheriffHoxworth.  No bubbles or bleeding was noted.  .     The specimen was extracted through the 15 trocar site which was closed with a single 0 Vicryl.  Wounds were infiltrated with Exparel and closed with 4-0 Monocryl and Dermabond  The patient was taken to PACU in stable condition.  Monica Cantu.    Monica B. Daphine DeutscherMartin, MD, [facility], [place] [phone]

## 2017-03-02 NOTE — Op Note (Signed)
Procedure: Upper GI endoscopy  Description of procedure: Upper GI endoscopy is performed at the completion of laparoscopic sleeve gastrectomy by Dr.  Daphine DeutscherMartin.  The video endoscope was introduced into the upper esophagus and then passed to the EG junction at about 40 cm. The esophagus appeared normal. The gastric sleeve was entered. The sleeve was tensely distended with air while the outlet was obstructed under saline irrigation by the operating surgeon. There was no evidence of leak. The staple line was intact and without bleeding. The scope was advanced to the antrum and pylorus visualized. There was no stricture or twisting or mucosal abnormality, and particularly no narrowing noted at the incisura.  The pouch was then desufflated and the scope withdrawn.  Mariella SaaBenjamin T Fritzie Prioleau MD, FACS  03/02/2017, 5:58 PM

## 2017-03-02 NOTE — Anesthesia Preprocedure Evaluation (Signed)
Anesthesia Evaluation  Patient identified by MRN, date of birth, ID band Patient awake    Reviewed: Allergy & Precautions, H&P , NPO status , Patient's Chart, lab work & pertinent test results  History of Anesthesia Complications (+) PONV and history of anesthetic complications  Airway Mallampati: II   Neck ROM: full    Dental   Pulmonary asthma ,    breath sounds clear to auscultation       Cardiovascular hypertension,  Rhythm:regular Rate:Normal     Neuro/Psych    GI/Hepatic   Endo/Other  Morbid obesity  Renal/GU      Musculoskeletal   Abdominal   Peds  Hematology   Anesthesia Other Findings   Reproductive/Obstetrics                             Anesthesia Physical Anesthesia Plan  ASA: II  Anesthesia Plan: General   Post-op Pain Management:    Induction: Intravenous  PONV Risk Score and Plan: 4 or greater and Ondansetron, Dexamethasone, Midazolam, Scopolamine patch - Pre-op and Treatment may vary due to age or medical condition  Airway Management Planned: Oral ETT  Additional Equipment:   Intra-op Plan:   Post-operative Plan: Extubation in OR  Informed Consent: I have reviewed the patients History and Physical, chart, labs and discussed the procedure including the risks, benefits and alternatives for the proposed anesthesia with the patient or authorized representative who has indicated his/her understanding and acceptance.     Plan Discussed with: CRNA, Anesthesiologist and Surgeon  Anesthesia Plan Comments:         Anesthesia Quick Evaluation

## 2017-03-02 NOTE — Discharge Instructions (Signed)
° ° ° °GASTRIC BYPASS/SLEEVE ° Home Care Instructions ° ° These instructions are to help you care for yourself when you go home. ° °Call: If you have any problems. °• Call [phone] and ask for the surgeon on call °• If you need immediate assistance come to the ER at [facility]. Tell the ER staff you are a new post-op gastric bypass or gastric sleeve patient  °Signs and symptoms to report: • Severe  vomiting or nausea °o If you cannot handle clear liquids for longer than 1 day, call your surgeon °• Abdominal pain which does not get better after taking your pain medication °• Fever greater than 100.4°  F and chills °• Heart rate over 100 beats a minute °• Trouble breathing °• Chest pain °• Redness,  swelling, drainage, or foul odor at incision (surgical) sites °• If your incisions open or pull apart °• Swelling or pain in calf (lower leg) °• Diarrhea (Loose bowel movements that happen often), frequent watery, uncontrolled bowel movements °• Constipation, (no bowel movements for 3 days) if this happens: °o Take Milk of Magnesia, 2 tablespoons by mouth, 3 times a day for 2 days if needed °o Stop taking Milk of Magnesia once you have had a bowel movement °o Call your doctor if constipation continues °Or °o Take Miralax  (instead of Milk of Magnesia) following the label instructions °o Stop taking Miralax once you have had a bowel movement °o Call your doctor if constipation continues °• Anything you think is “abnormal for you” °  °Normal side effects after surgery: • Unable to sleep at night or unable to concentrate °• Irritability °• Being tearful (crying) or depressed ° °These are common complaints, possibly related to your anesthesia, stress of surgery, and change in lifestyle, that usually go away a few weeks after surgery. If these feelings continue, call your medical doctor.  °Wound Care: You may have surgical glue, steri-strips, or staples over your incisions after surgery °• Surgical glue: Looks like clear  film over your incisions and will wear off a little at a time °• Steri-strips: Adhesive strips of tape over your incisions. You may notice a yellowish color on skin under the steri-strips. This is used to make the steri-strips stick better. Do not pull the steri-strips off - let them fall off °• Staples: Staples may be removed before you leave the hospital °o If you go home with staples, call [facility] for an appointment with your surgeon’s nurse to have staples removed 10 days after surgery, [phone] °• Showering: You may shower two (2) days after your surgery unless your surgeon tells you differently °o Wash gently around incisions with warm soapy water, rinse well, and gently pat dry °o If you have a drain (tube from your incision), you may need someone to hold this while you shower °o No tub baths until staples are removed and incisions are healed °  °Medications: • Medications should be liquid or crushed if larger than the size of a dime °• Extended release pills (medication that releases a little bit at a time through the  day) should not be crushed °• Depending on the size and number of medications you take, you may need to space (take a few throughout the day)/change the time you take your medications so that you do not over-fill your pouch (smaller stomach) °• Make sure you follow-up with you primary care physician to make medication changes needed during rapid weight loss and life -style changes °•   If you have diabetes, follow up with your doctor that orders your diabetes medication(s) within one week after surgery and check your blood sugar regularly ° °• Do not drive while taking narcotics (pain medications) ° °• Do not take acetaminophen (Tylenol) and Roxicet or Lortab Elixir at the same time since these pain medications contain acetaminophen °  °Diet:  °First 2 Weeks You will see the nutritionist about two (2) weeks after your surgery. The nutritionist will increase the types of  foods you can eat if you are handling liquids well: °• If you have severe vomiting or nausea and cannot handle clear liquids lasting longer than 1 day call your surgeon °Protein Shake °• Drink at least 2 ounces of shake 5-6 times per day °• Each serving of protein shakes (usually 8-12 ounces) should have a minimum of: °o 15 grams of protein °o And no more than 5 grams of carbohydrate °• Goal for protein each day: °o Men = 80 grams per day °o Women = 60 grams per day °  ° • Protein powder may be added to fluids such as non-fat milk or Lactaid milk or Soy milk (limit to 35 grams added protein powder per serving) ° °Hydration °• Slowly increase the amount of water and other clear liquids as tolerated (See Acceptable Fluids) °• Slowly increase the amount of protein shake as tolerated °• Sip fluids slowly and throughout the day °• May use sugar substitutes in small amounts (no more than 6-8 packets per day; i.e. Splenda) ° °Fluid Goal °• The first goal is to drink at least 8 ounces of protein shake/drink per day (or as directed by the nutritionist); some examples of protein shakes are Syntrax Nectar, Adkins Advantage, EAS Edge HP, and Unjury. - See handout from pre-op Bariatric Education Class: °o Slowly increase the amount of protein shake you drink as tolerated °o You may find it easier to slowly sip shakes throughout the day °o It is important to get your proteins in first °• Your fluid goal is to drink 64-100 ounces of fluid daily °o It may take a few weeks to build up to this  °• 32 oz. (or more) should be clear liquids °And °• 32 oz. (or more) should be full liquids (see below for examples) °• Liquids should not contain sugar, caffeine, or carbonation ° °Clear Liquids: °• Water of Sugar-free flavored water (i.e. Fruit H²O, Propel) °• Decaffeinated coffee or tea (sugar-free) °• Crystal lite, Wyler’s Lite, Minute Maid Lite °• Sugar-free Jell-O °• Bouillon or broth °• Sugar-free Popsicle:    - Less than 20 calories  each; Limit 1 per day ° °Full Liquids: °                  Protein Shakes/Drinks + 2 choices per day of other full liquids °• Full liquids must be: °o No More Than 12 grams of Carbs per serving °o No More Than 3 grams of Fat per serving °• Strained low-fat cream soup °• Non-Fat milk °• Fat-free Lactaid Milk °• Sugar-free yogurt (Dannon Lite & Fit, Greek yogurt) ° °  °Vitamins and Minerals • Start 1 day after surgery unless otherwise directed by your surgeon °• 2 Chewable Bariatric Multivitamin / Multimineral Supplement with iron °• Chewable Calcium Citrate with Vitamin D-3 °(Example: 3 Chewable Calcium  Plus 600 with Vitamin D-3) °o Take 500 mg three (3) times a day for a total of 1500 mg each day °o Do not take all 3 doses of calcium   at one time as it may cause constipation, and you can only absorb 500 mg at a time °o Do not mix multivitamins containing iron with calcium supplements;  take 2 hours apart °• Menstruating women and those at risk for anemia ( a blood disease that causes weakness) may need extra iron °o Talk to your doctor to see if you need more iron °• If you need extra iron: Total daily Iron recommendation (including Vitamins) is 50 to 100 mg Iron/day °• Do not stop taking or change any vitamins or minerals until you talk to your nutritionist or surgeon °• Your nutritionist and/or surgeon must approve all vitamin and mineral supplements °  °Activity and Exercise: It is important to continue walking at home. Limit your physical activity as instructed by your doctor. During this time, use these guidelines: °• Do not lift anything greater than ten  (10) pounds for at least two (2) weeks °• Do not go back to work or drive until your surgeon says you can °• You may have sex when you feel comfortable °o It is VERY important for female patients to use a reliable birth control method; fertility often increase after surgery °o Do not get pregnant for at least 18 months °• Start exercising as soon as your  doctor tells you that you can °o Make sure your doctor approves any physical activity °• Start with a simple walking program °• Walk 5-15 minutes each day, 7 days per week °• Slowly increase until you are walking 30-45 minutes per day °• Consider joining our BELT program. [phone] or email [email or web address] °  °Special Instructions Things to remember: °• Use your CPAP when sleeping if this applies to you °• Consider buying a medical alert bracelet that says you had lap-band surgery °  °  You will likely have your first fill (fluid added to your band) 6 - 8 weeks after surgery °• [facility] has a free Bariatric Surgery Support Group that meets monthly, the 3rd Thursday, 6pm. [facility]. You can see classes online at [email or web address] °• It is very important to keep all follow up appointments with your surgeon, nutritionist, primary care physician, and behavioral health practitioner °o After the first year, please follow up with your bariatric surgeon and nutritionist at least once a year in order to maintain best weight loss results °      °             [facility]:  [phone] ° °             [facility]: [phone] ° °             Bariatric Nurse Coordinator: [phone]  °Gastric Bypass/Sleeve Home Care Instructions  Rev. 05/2012    ° °                                                    Reviewed and Endorsed °                                                   by [facility] Patient Education Committee, Jan, 2014 ° ° ° ° ° ° ° ° ° °

## 2017-03-02 NOTE — Anesthesia Procedure Notes (Signed)
Procedure Name: Intubation Date/Time: 03/02/2017 4:11 PM Performed by: Claudia Desanctis, CRNA Pre-anesthesia Checklist: Patient identified, Emergency Drugs available, Suction available and Patient being monitored Patient Re-evaluated:Patient Re-evaluated prior to induction Oxygen Delivery Method: Circle system utilized Preoxygenation: Pre-oxygenation with 100% oxygen Induction Type: IV induction Ventilation: Mask ventilation without difficulty Laryngoscope Size: Mac and 3 Grade View: Grade I Tube type: Oral Tube size: 7.5 mm Number of attempts: 1 Airway Equipment and Method: Stylet Placement Confirmation: ETT inserted through vocal cords under direct vision,  breath sounds checked- equal and bilateral and positive ETCO2 Secured at: 22 cm Tube secured with: Tape Dental Injury: Teeth and Oropharynx as per pre-operative assessment

## 2017-03-03 ENCOUNTER — Encounter (HOSPITAL_COMMUNITY): Payer: Self-pay | Admitting: Surgery

## 2017-03-03 ENCOUNTER — Other Ambulatory Visit: Payer: Self-pay

## 2017-03-03 LAB — CBC WITH DIFFERENTIAL/PLATELET
Basophils Absolute: 0 10*3/uL (ref 0.0–0.1)
Basophils Relative: 0 %
EOS ABS: 0 10*3/uL (ref 0.0–0.7)
EOS PCT: 0 %
HCT: 35.9 % — ABNORMAL LOW (ref 36.0–46.0)
Hemoglobin: 11.7 g/dL — ABNORMAL LOW (ref 12.0–15.0)
LYMPHS ABS: 1 10*3/uL (ref 0.7–4.0)
LYMPHS PCT: 13 %
MCH: 27.2 pg (ref 26.0–34.0)
MCHC: 32.6 g/dL (ref 30.0–36.0)
MCV: 83.5 fL (ref 78.0–100.0)
MONOS PCT: 5 %
Monocytes Absolute: 0.4 10*3/uL (ref 0.1–1.0)
Neutro Abs: 6.5 10*3/uL (ref 1.7–7.7)
Neutrophils Relative %: 82 %
PLATELETS: 164 10*3/uL (ref 150–400)
RBC: 4.3 MIL/uL (ref 3.87–5.11)
RDW: 15.8 % — ABNORMAL HIGH (ref 11.5–15.5)
WBC: 8 10*3/uL (ref 4.0–10.5)

## 2017-03-03 MED ORDER — INFLUENZA VAC SPLIT QUAD 0.5 ML IM SUSY: 0.5000 mL | PREFILLED_SYRINGE | INTRAMUSCULAR | Status: DC

## 2017-03-03 NOTE — Plan of Care (Signed)

## 2017-03-03 NOTE — Progress Notes (Signed)
Patient alert and oriented, pain is controlled. Patient is tolerating fluids, advanced to protein shake today, patient is tolerating well.  Reviewed Gastric sleeve discharge instructions with patient and patient is able to articulate understanding.  Provided information on BELT program, Support Group and [facility] outpatient pharmacy. All questions answered, will continue to monitor.  

## 2017-03-03 NOTE — Progress Notes (Signed)
Patient alert and oriented, Post op day 1.  Provided support and encouragement.  Encouraged pulmonary toilet, ambulation and small sips of liquids.  Patient continues to work on clear fluids.  Finished 6 ounces at this time.  All questions answered.  Will continue to monitor.

## 2017-03-03 NOTE — Progress Notes (Signed)
Patient ID: Monica Cantu, female   DOB: 11/26/1985, 31 y.o.   MRN: [id number] [facility] Surgery Progress Note:   1 Day Post-Op  Subjective: Mental status is clear.   Objective: Vital signs in last 24 hours: Temp:  [97.8 F (36.6 C)-99.4 F (37.4 C)] 98.8 F (37.1 C) (11/27 1000) Pulse Rate:  [74-109] 74 (11/27 1000) Resp:  [16-21] 18 (11/27 1000) BP: (109-157)/(60-86) 157/75 (11/27 1000) SpO2:  [95 %-100 %] 100 % (11/27 1000)  Intake/Output from previous day: 11/26 0701 - 11/27 0700 In: 2136.7 [P.O.:180; I.V.:1706.7; IV Piggyback:250] Out: 375 [Urine:350; Blood:25] Intake/Output this shift: Total I/O In: 180 [P.O.:180] Out: 900 [Urine:900]  Physical Exam: Work of breathing is not labored.  Drinking slowly in the morning.  Reassess in the pm  Lab Results:  Results for orders placed or performed during the hospital encounter of 03/02/17 (from the past 48 hour(s))  CBC     Status: Abnormal   Collection Time: 03/02/17 10:12 PM  Result Value Ref Range   WBC 10.9 (H) 4.0 - 10.5 K/uL   RBC 4.63 3.87 - 5.11 MIL/uL   Hemoglobin 12.5 12.0 - 15.0 g/dL   HCT 38.6 36.0 - 46.0 %   MCV 83.4 78.0 - 100.0 fL   MCH 27.0 26.0 - 34.0 pg   MCHC 32.4 30.0 - 36.0 g/dL   RDW 15.7 (H) 11.5 - 15.5 %   Platelets 176 150 - 400 K/uL  Creatinine, serum     Status: None   Collection Time: 03/02/17 10:12 PM  Result Value Ref Range   Creatinine, Ser 0.96 0.44 - 1.00 mg/dL   GFR calc non Af Amer >60 >60 mL/min   GFR calc Af Amer >60 >60 mL/min    Comment: (NOTE) The eGFR has been calculated using the CKD EPI equation. This calculation has not been validated in all clinical situations. eGFR's persistently <60 mL/min signify possible Chronic Kidney Disease.   CBC WITH DIFFERENTIAL     Status: Abnormal   Collection Time: 03/03/17  5:16 AM  Result Value Ref Range   WBC 8.0 4.0 - 10.5 K/uL   RBC 4.30 3.87 - 5.11 MIL/uL   Hemoglobin 11.7 (L) 12.0 - 15.0 g/dL   HCT 35.9 (L) 36.0 - 46.0 %    MCV 83.5 78.0 - 100.0 fL   MCH 27.2 26.0 - 34.0 pg   MCHC 32.6 30.0 - 36.0 g/dL   RDW 15.8 (H) 11.5 - 15.5 %   Platelets 164 150 - 400 K/uL   Neutrophils Relative % 82 %   Neutro Abs 6.5 1.7 - 7.7 K/uL   Lymphocytes Relative 13 %   Lymphs Abs 1.0 0.7 - 4.0 K/uL   Monocytes Relative 5 %   Monocytes Absolute 0.4 0.1 - 1.0 K/uL   Eosinophils Relative 0 %   Eosinophils Absolute 0.0 0.0 - 0.7 K/uL   Basophils Relative 0 %   Basophils Absolute 0.0 0.0 - 0.1 K/uL    Radiology/Results: No results found.  Anti-infectives: Anti-infectives (From admission, onward)   Start     Dose/Rate Route Frequency Ordered Stop   03/02/17 1157  cefoTEtan in Dextrose 5% (CEFOTAN) IVPB 2 g     2 g Intravenous On call to O.R. 03/02/17 1157 03/02/17 1624      Assessment/Plan: Problem List: Patient Active Problem List   Diagnosis Date Noted  . S/P laparoscopic sleeve gastrectomyNov 2018 03/02/2017  . Encounter for IUD insertion 12/19/2016  . Low back pain radiating to  left leg 09/30/2016  . Multinodular goiter 05/27/2016  . Annual physical exam 02/22/2016  . PCP NOTES >>>>>>>>>>>>>>>>>>>>> 05/31/2015  . Asthma 05/11/2015  . Morbid obesity ([place]) 04/24/2015  . Elevated blood pressure 04/24/2015    Slow progress.  To reassess in the pm about possible discharge.   1 Day Post-Op    LOS: 1 day   Matt B. Hassell Done, MD, [facility] [phone] beeper [phone]  03/03/2017 2:29 PM

## 2017-03-03 NOTE — Progress Notes (Signed)
Assessment unchanged. Pt verbalized understanding of dc instructions through teach back including [facility], When to call the doctor, and follow up care. Continues to tolerated protein shakes, ambulating with out report of increase pain or discomfort. IV dc'd. Pt discharged via wc to front entrance accomapnied by husband, child, and NT.

## 2017-03-03 NOTE — Discharge Summary (Signed)
Physician Discharge Summary  Patient ID: Monica PandyRickeda L Barnwell MRN: [id number] DOB/AGE: 33/10/1985 33 y.o.  Admit date: 03/02/2017 Discharge date: 03/03/2017  Admission Diagnoses:  Morbid obesity BMI>60  Discharge Diagnoses:  same  Principal Problem:   S/P laparoscopic sleeve gastrectomyNov 2018   Surgery:  Lap sleeve gastrectomy  Discharged Condition: improved  Hospital Course:   Had surgery Monday afternoon.  Was begun on liquids and advanced to shakes prior to discharge.    Consults: none  Significant Diagnostic Studies: none    Discharge Exam: Blood pressure (!) 157/75, pulse 74, temperature 98.8 F (37.1 C), temperature source Oral, resp. rate 18, height 5\' 5"  (1.651 m), weight (!) 170.9 kg (376 lb 12.8 oz), SpO2 100 %. Incisions OK  Disposition: 01-Home or Self Care  Discharge Instructions    Ambulate hourly while awake   Complete by:  As directed    Call MD for:  difficulty breathing, headache or visual disturbances   Complete by:  As directed    Call MD for:  persistant dizziness or light-headedness   Complete by:  As directed    Call MD for:  persistant nausea and vomiting   Complete by:  As directed    Call MD for:  redness, tenderness, or signs of infection (pain, swelling, redness, odor or green/yellow discharge around incision site)   Complete by:  As directed    Call MD for:  severe uncontrolled pain   Complete by:  As directed    Call MD for:  temperature >101 F   Complete by:  As directed    Diet bariatric full liquid   Complete by:  As directed    Incentive spirometry   Complete by:  As directed    Perform hourly while awake     Allergies as of 03/03/2017   No Known Allergies     Medication List    STOP taking these medications   ibuprofen 800 MG tablet Commonly known as:  ADVIL,MOTRIN   nystatin-triamcinolone ointment Commonly known as:  MYCOLOG     TAKE these medications   albuterol 108 (90 Base) MCG/ACT inhaler Commonly known as:   VENTOLIN HFA Inhale 1-2 puffs into the lungs every 4 (four) hours as needed for wheezing or shortness of breath.   beclomethasone 80 MCG/ACT inhaler Commonly known as:  QVAR Inhale 2 puffs into the lungs daily.   cetirizine 10 MG tablet Commonly known as:  ZYRTEC Take 1 tablet (10 mg total) by mouth at bedtime.   fluticasone 50 MCG/ACT nasal spray Commonly known as:  FLONASE Place 2 sprays daily into both nostrils.   KYLEENA 19.5 MG Iud Generic drug:  Levonorgestrel by Intrauterine route.   Lorcaserin HCl 10 MG Tabs Commonly known as:  BELVIQ Take 10 mg by mouth 2 (two) times daily.      Follow-up Information    [facility]. Go on 03/24/2017.   Specialty:  General Surgery Why:  at 10 with Dr Wenda LowMatt Jerome Otter Contact information: [address] [place] [address] [phone]        [facility] Follow up.   Specialty:  General Surgery Contact information: [address] [place] [address] [phone]           Signed: Valarie MerinoMatthew B Dody Smartt 03/03/2017, 5:11 PM

## 2017-03-04 ENCOUNTER — Telehealth (HOSPITAL_COMMUNITY): Payer: Self-pay

## 2017-03-04 NOTE — Anesthesia Postprocedure Evaluation (Signed)
Anesthesia Post Note  Patient: Estanislado Pandyickeda L Barnwell  Procedure(s) Performed: LAPAROSCOPIC GASTRIC SLEEVE RESECTION, UPPER ENDO (N/A Abdomen)     Patient location during evaluation: PACU Anesthesia Type: General Level of consciousness: awake and alert Pain management: pain level controlled Vital Signs Assessment: post-procedure vital signs reviewed and stable Respiratory status: spontaneous breathing, nonlabored ventilation, respiratory function stable and patient connected to nasal cannula oxygen Cardiovascular status: blood pressure returned to baseline and stable Postop Assessment: no apparent nausea or vomiting Anesthetic complications: no    Last Vitals:  Vitals:   03/03/17 0600 03/03/17 1000  BP: 109/75 (!) 157/75  Pulse: 82 74  Resp: 17 18  Temp: 37.2 C 37.1 C  SpO2: 96% 100%    Last Pain:  Vitals:   03/03/17 1306  TempSrc:   PainSc: 6    Pain Goal: Patients Stated Pain Goal: 3 (03/03/17 1306)               Demarrion Meiklejohn S

## 2017-03-04 NOTE — Telephone Encounter (Signed)
Patient had questions regarding Tylenol dosage.  We discussed options including cutting tablet or getting adult liquid tylenol.  Questions answered.

## 2017-03-05 ENCOUNTER — Telehealth (HOSPITAL_COMMUNITY): Payer: Self-pay

## 2017-03-05 NOTE — Telephone Encounter (Signed)
Follow up with bariatric surgical patient to discuss post discharge questions.   1.  Are you having any pain not relieved by pain medication?pain medication has helped when she takes it  2.  How much fluid total fluid intake have you had in the last 24/48 hours?  Around 33 ounces today not as much yesterday,  Feels much better today when drinking, a lot less nausea  3.  How much protein intake have you had in the last 24/48 hours?30 grams, talked about increasing each day   4.  Have you had any trouble making urine?no patient states she is making a lot of urine  5.  Have you had nausea that has not been relieved by nausea medication?nausea medication has worked has not had but one time today and relieved nausea  6.  Are you ambulating every hour?yes  7.  Are you passing gas or had a BM?passing gas no  BM encouraged to try miralax or mom  8.  Do you know how to contact [facility]? [facility]? [facility]?yes  9.  Are you taking your vitamins and calcium without difficulty?yes  10. Tell me how your incision looks?  Any redness, open incision, or drainage?One incision had some pink tinged fluid, no redness or other drainage,  Discussed watching for signs of infection and may use a bandaid  Patient had question about numbness in left upper thigh, no redness, no pain, no swelling, patient has slipped disc in back.  Discussed all of the above.  Patient concern was DVT we reeducated on signs and symptoms and to call if those symptoms arose.

## 2017-03-06 ENCOUNTER — Telehealth: Payer: Self-pay | Admitting: Family Medicine

## 2017-03-06 NOTE — Telephone Encounter (Signed)
It's unusual to have symptoms into both legs.  However, I would only recommend she go to the ED if she has any of the following:  Weakness in both legs, difficulty with bowel or bladder function, numbness in genital region.  These suggest something called cauda equina syndrome which requires immediate evaluation.    Otherwise I'd recommend she take medicines recommended by the bariatric physicians and we could see her for this early next week if it's persisting.

## 2017-03-06 NOTE — Telephone Encounter (Signed)
Patient calling requesting advice from provider.   States she had a gastric surgery performed on Monday, 11/26, and yesterday she woke up with numbness in both of her thighs and today she has numbness and burning.   She called her surgeon and they do not think it is related to the surgery, but said it sounds like something nerve related. She states she has been seen here at [facility] for a pinched nerve in her back and wants to know if the pinched nerve and numbness could be related.   She wanted to know provider's opinion if she should go to the ED or if the numbness could be from position when sleeping.

## 2017-03-06 NOTE — Telephone Encounter (Signed)
Patient was informed. She voiced understanding and said she will call Monday if symptoms persist

## 2017-03-17 ENCOUNTER — Encounter: Payer: BLUE CROSS/BLUE SHIELD | Attending: Surgery | Admitting: Skilled Nursing Facility1

## 2017-03-17 DIAGNOSIS — Z029 Encounter for administrative examinations, unspecified: Secondary | ICD-10-CM | POA: Diagnosis present

## 2017-03-18 NOTE — Progress Notes (Signed)
Bariatric Class:  Appt start time: 1530 end time:  1630.  2 Week Post-Operative Nutrition Class  Patient was seen on 03/17/2017 for [facility]ative [facility]   Pt states she has been having trouble getting her fluid in.   Surgery date: 03/02/2017 Surgery type: sleeve Start weight at [facility]: 366.4 Weight today: 359.2  TANITA  BODY COMP RESULTS  03/17/2017   BMI (kg/m^2) 59.8   Fat Mass (lbs) 200.4   Fat Free Mass (lbs) 158.8   Total Body Water (lbs) 120.6   The following the learning objectives were met by the patient during this course:  Identifies Phase 3A (Soft, High Proteins) Dietary Goals and will begin from 2 weeks post-operatively to 2 months post-operatively  Identifies appropriate sources of fluids and proteins   States protein recommendations and appropriate sources post-operatively  Identifies the need for appropriate texture modifications, mastication, and bite sizes when consuming solids  Identifies appropriate multivitamin and calcium sources post-operatively  Describes the need for physical activity post-operatively and will follow MD recommendations  States when to call healthcare provider regarding medication questions or post-operative complications  Handouts given during class include:  Phase 3A: Soft, High Protein Diet Handout  Follow-Up Plan: Patient will follow-up at [facility] in 6 weeks for 2 month post-op nutrition visit for diet advancement per MD.

## 2017-03-25 ENCOUNTER — Telehealth: Payer: Self-pay | Admitting: Skilled Nursing Facility1

## 2017-03-25 NOTE — Telephone Encounter (Signed)
Pt asked about multivitamins.  Pt also asked if she should take a separate zinc: dietitian answered no. Pt states she is doing better getting her fluid in.

## 2017-04-08 ENCOUNTER — Telehealth: Payer: Self-pay | Admitting: Skilled Nursing Facility1

## 2017-04-08 NOTE — Telephone Encounter (Signed)
Returned call. [facility] so did not leave a message.

## 2017-04-08 NOTE — Telephone Encounter (Signed)
Pt states she has been having trouble with animal products. Pt admits to cooking her foods with butter.   Dietitian advised she eat vegetarian products.

## 2017-05-04 ENCOUNTER — Encounter: Payer: BLUE CROSS/BLUE SHIELD | Attending: Surgery | Admitting: Skilled Nursing Facility1

## 2017-05-04 ENCOUNTER — Encounter: Payer: Self-pay | Admitting: Skilled Nursing Facility1

## 2017-05-04 DIAGNOSIS — Z029 Encounter for administrative examinations, unspecified: Secondary | ICD-10-CM | POA: Insufficient documentation

## 2017-05-04 NOTE — Patient Instructions (Addendum)
-  For breakfast eat within 1-1.5 hours and have: yogurt or protein shake or beans  -Think of the nausea in the morning as a hunger cue  -Do propel zero instead of Gatorade  -Aim in get in 2 of your 32 oz cups of fluid  -Lemon is fine  -Aim to eat every 3 hours   -Keep working on eating at the table, chewing well, going slow

## 2017-05-04 NOTE — Progress Notes (Signed)
Follow-up visit:  8 Weeks Post-Operative Sleeve Surgery  Primary concerns today: Post-operative Bariatric Surgery Nutrition Management.  Pt states she is no longer taking blood pressure medication. Pt states she does not like chicken and beef does not settle well. Pt states she will do broth if she does not feel like eating or protein shakes. Pt states she has reminders in her phone to take her vitamins and to eat. Pt states she is still seeing the psychologist. Pt states she is trying to adjust to life after surgery and emotional eating. Pt states she is sleeping better now. Pt states she has thrown up a few times due to eating too fast and too much at once. Pt states she has been trying to eat at the kitchen table. Pt sates she is frustrated with trying to find foods she likes. Pt states she is only having 1 bowel movement a week.     Surgery date: 03/02/2017 [facility] Weight today: 339 Weight Change: 20  TANITA  BODY COMP RESULTS  03/17/2017 05/04/2017   BMI (kg/m^2) 59.8 56.4   Fat Mass (lbs) 200.4 176.2   Fat Free Mass (lbs) 158.8 162.8   Total Body Water (lbs) 120.6 122.4   24-hr recall: snaking on [place] bites throughout the day B (AM): 1 slice of bacon  Snk (AM): 1/2 egg (does not like eggs) L (PM): lite and fit yogurt or just broth Snk (PM): beans D (PM): veggie patty or veggie nuggets  Snk (PM):  Fluid intake: water with flavorings and natures twist: 48 Estimated total protein intake: 50  Medications: See List Supplementation: bariatric choice-low in vitamin D and taking tums for calcium   Using straws: no Drinking while eating: no Having you been chewing well:no Chewing/swallowing difficulties: no Changes in vision: no Changes to mood/headaches: no Hair loss/Cahnges to skin/Changes to nails: no Any difficulty focusing or concentrating: no Sweating: no Dizziness/Lightheaded: no Palpitations: no  Carbonated beverages:  no N/V/D/C/GAS: vomiting and constipation Abdominal Pain: no Dumping syndrome: no  Recent physical activity:  3-4 times a week walking 30 minutes  Progress Towards Goal(s):  In progress.  Handouts given during visit include:  Non starchy veggies + protein   Nutritional Diagnosis:  [place]-3.3 Overweight/obesity related to past poor dietary habits and physical inactivity as evidenced by patient w/ recent sleeve surgery following dietary guidelines for continued weight loss.     Intervention:  Nutrition counseling. Dietitian educated the pt on advancing her diet ot include non-starchy vegetables. Goals: -For breakfast eat within 1-1.5 hours and have: yogurt or protein shake or beans -Think of the nausea in the morning as a hunger cue -Do propel zero instead of Gatorade -Aim in get in 2 of your 32 oz cups of fluid -Lemon is fine -Aim to eat every 3 hours  -Keep working on eating at the table, chewing well, going slow  Teaching Method Utilized:  Visual Auditory Hands on  Barriers to learning/adherence to lifestyle change: emotional eating   Demonstrated degree of understanding via:  Teach Back   Monitoring/Evaluation:  Dietary intake, exercise, and body weight.

## 2017-05-18 ENCOUNTER — Encounter: Payer: Self-pay | Admitting: Internal Medicine

## 2017-05-18 ENCOUNTER — Ambulatory Visit: Payer: BLUE CROSS/BLUE SHIELD | Admitting: Family Medicine

## 2017-05-18 ENCOUNTER — Ambulatory Visit: Payer: BLUE CROSS/BLUE SHIELD | Admitting: Internal Medicine

## 2017-05-18 VITALS — BP 128/74 | HR 83 | Temp 98.8°F | Resp 14 | Ht 65.0 in | Wt 328.4 lb

## 2017-05-18 DIAGNOSIS — B349 Viral infection, unspecified: Secondary | ICD-10-CM

## 2017-05-18 DIAGNOSIS — J029 Acute pharyngitis, unspecified: Secondary | ICD-10-CM | POA: Diagnosis not present

## 2017-05-18 LAB — POCT INFLUENZA A/B
INFLUENZA A, POC: NEGATIVE
Influenza B, POC: NEGATIVE

## 2017-05-18 LAB — POCT RAPID STREP A (OFFICE): RAPID STREP A SCREEN: NEGATIVE

## 2017-05-18 MED ORDER — HYDROCODONE-HOMATROPINE 5-1.5 MG/5ML PO SYRP: 5.0000 mL | ORAL_SOLUTION | Freq: Every evening | ORAL | 0 refills | Status: DC | PRN

## 2017-05-18 NOTE — Progress Notes (Deleted)
  No chief complaint on file.   HPI  4 review of systems  Past Medical History:  Diagnosis Date  . Allergy   . Asthma   . Hypertension   . Multiple thyroid nodules    per patient   . PONV (postoperative nausea and vomiting)    with D+ C and tonsillectomy   . Pre-diabetes     Current Outpatient Medications  Medication Sig Dispense Refill  . albuterol (VENTOLIN HFA) 108 (90 Base) MCG/ACT inhaler Inhale 1-2 puffs into the lungs every 4 (four) hours as needed for wheezing or shortness of breath. 18 g 5  . beclomethasone (QVAR) 80 MCG/ACT inhaler Inhale 2 puffs into the lungs daily. 8.7 g 5  . cetirizine (ZYRTEC) 10 MG tablet Take 1 tablet (10 mg total) by mouth at bedtime. 30 tablet 5  . fluticasone (FLONASE) 50 MCG/ACT nasal spray Place 2 sprays daily into both nostrils.    . Levonorgestrel (KYLEENA) 19.5 MG IUD by Intrauterine route.    . Lorcaserin HCl (BELVIQ) 10 MG TABS Take 10 mg by mouth 2 (two) times daily. (Patient not taking: Reported on 01/19/2017) 60 tablet 1   No current facility-administered medications for this visit.     Allergies: No Known Allergies  Past Surgical History:  Procedure Laterality Date  . DILATION AND CURETTAGE OF UTERUS    . LAPAROSCOPIC GASTRIC SLEEVE RESECTION N/A 03/02/2017   Procedure: LAPAROSCOPIC GASTRIC SLEEVE RESECTION, UPPER ENDO;  Surgeon: Luretha MurphyMartin, Matthew, MD;  Location: [facility];  Service: General;  Laterality: N/A;  . TONSILLECTOMY AND ADENOIDECTOMY      Social History   Socioeconomic History  . Marital status: Divorced    Spouse name: Not on file  . Number of children: 2  . Years of education: Not on file  . Highest education level: Not on file  Social Needs  . Financial resource strain: Not on file  . Food insecurity - worry: Not on file  . Food insecurity - inability: Not on file  . Transportation needs - medical: Not on file  . Transportation needs - non-medical: Not on file  Occupational History  . Occupation: Chief Executive Officerassitent  director fro [employer]  Tobacco Use  . Smoking status: Never Smoker  . Smokeless tobacco: Never Used  Substance and Sexual Activity  . Alcohol use: No    Alcohol/week: 0.0 oz  . Drug use: No  . Sexual activity: Yes    Partners: Male    Birth control/protection: Condom, IUD  Other Topics Concern  . Not on file  Social History Narrative   G4P2  (2 miscarriages)    Household-- pt and 2 children    Family History  Problem Relation Age of Onset  . Hyperlipidemia Father   . Diabetes Maternal Grandmother   . Breast cancer Maternal Grandmother   . Heart disease Paternal Grandmother   . Hypertension Paternal Grandmother   . Stroke Other   . Colon cancer Neg Hx      ROS Review of Systems See HPI Constitution: No fevers or chills No malaise No diaphoresis Skin: No rash or itching Eyes: no blurry vision, no double vision GU: no dysuria or hematuria Neuro: no dizziness or headaches * all others reviewed and negative   Objective: There were no vitals filed for this visit.  Physical Exam  Assessment and Plan There are no diagnoses linked to this encounter.   Monica Cantu P [employer]

## 2017-05-18 NOTE — Patient Instructions (Signed)
Rest, fluids , tylenol  For cough:  Take Mucinex DM twice a day as needed until better If the cough continue or it is severe, take hydrocodone at night.  Will cause drowsiness   For nasal congestion: Use OTC Nasocort or Flonase : 2 nasal sprays on each side of the nose in the morning until you feel better  Call if not gradually better over the next  10 days  Call anytime if the symptoms are severe  Also call if you feel better and then the symptoms resurface   Influenza, Adult Influenza, more commonly known as "the flu," is a viral infection that primarily affects the respiratory tract. The respiratory tract includes organs that help you breathe, such as the lungs, nose, and throat. The flu causes many common cold symptoms, as well as a high fever and body aches. The flu spreads easily from person to person (is contagious). Getting a flu shot (influenza vaccination) every year is the best way to prevent influenza. What are the causes? Influenza is caused by a virus. You can catch the virus by:  Breathing in droplets from an infected person's cough or sneeze.  Touching something that was recently contaminated with the virus and then touching your mouth, nose, or eyes.  What increases the risk? The following factors may make you more likely to get the flu:  Not cleaning your hands frequently with soap and water or alcohol-based hand sanitizer.  Having close contact with many people during cold and flu season.  Touching your mouth, eyes, or nose without washing or sanitizing your hands first.  Not drinking enough fluids or not eating a healthy diet.  Not getting enough sleep or exercise.  Being under a high amount of stress.  Not getting a yearly (annual) flu shot.  You may be at a higher risk of complications from the flu, such as a severe lung infection (pneumonia), if you:  Are over the age of 34.  Are pregnant.  Have a weakened disease-fighting system (immune  system). You may have a weakened immune system if you: ? Have HIV or AIDS. ? Are undergoing chemotherapy. ? Aretaking medicines that reduce the activity of (suppress) the immune system.  Have a long-term (chronic) illness, such as heart disease, kidney disease, diabetes, or lung disease.  Have a liver disorder.  Are obese.  Have anemia.  What are the signs or symptoms? Symptoms of this condition typically last 4-10 days and may include:  Fever.  Chills.  Headache, body aches, or muscle aches.  Sore throat.  Cough.  Runny or congested nose.  Chest discomfort and cough.  Poor appetite.  Weakness or tiredness (fatigue).  Dizziness.  Nausea or vomiting.  How is this diagnosed? This condition may be diagnosed based on your medical history and a physical exam. Your health care provider may do a nose or throat swab test to confirm the diagnosis. How is this treated? If influenza is detected early, you can be treated with antiviral medicine that can reduce the length of your illness and the severity of your symptoms. This medicine may be given by mouth (orally) or through an IV tube that is inserted in one of your veins. The goal of treatment is to relieve symptoms by taking care of yourself at home. This may include taking over-the-counter medicines, drinking plenty of fluids, and adding humidity to the air in your home. In some cases, influenza goes away on its own. Severe influenza or complications from influenza may be  treated in a hospital. Follow these instructions at home:  Take over-the-counter and prescription medicines only as told by your health care provider.  Use a cool mist humidifier to add humidity to the air in your home. This can make breathing easier.  Rest as needed.  Drink enough fluid to keep your urine clear or pale yellow.  Cover your mouth and nose when you cough or sneeze.  Wash your hands with soap and water often, especially after you cough  or sneeze. If soap and water are not available, use hand sanitizer.  Stay home from work or school as told by your health care provider. Unless you are visiting your health care provider, try to avoid leaving home until your fever has been gone for 24 hours without the use of medicine.  Keep all follow-up visits as told by your health care provider. This is important. How is this prevented?  Getting an annual flu shot is the best way to avoid getting the flu. You may get the flu shot in late summer, fall, or winter. Ask your health care provider when you should get your flu shot.  Wash your hands often or use hand sanitizer often.  Avoid contact with people who are sick during cold and flu season.  Eat a healthy diet, drink plenty of fluids, get enough sleep, and exercise regularly. Contact a health care provider if:  You develop new symptoms.  You have: ? Chest pain. ? Diarrhea. ? A fever.  Your cough gets worse.  You produce more mucus.  You feel nauseous or you vomit. Get help right away if:  You develop shortness of breath or difficulty breathing.  Your skin or nails turn a bluish color.  You have severe pain or stiffness in your neck.  You develop a sudden headache or sudden pain in your face or ear.  You cannot stop vomiting. This information is not intended to replace advice given to you by your health care provider. Make sure you discuss any questions you have with your health care provider. Document Released: 03/21/2000 Document Revised: 08/30/2015 Document Reviewed: 01/16/2015 Elsevier Interactive Patient Education  2017 [employer].

## 2017-05-18 NOTE — Progress Notes (Signed)
Subjective:    Patient ID: Monica Cantu, female    DOB: 01/10/1986, 32 y.o.   MRN: [id number]  DOS:  05/18/2017 Type of visit - description : acute Interval history: URI last week with runny nose, ear and sinus congestion, mild headache. She got a lot worse 3 days ago: Generalized body aches, increased sore throat, the next day developed a temperature of 101.0. She is not feeling well since. 2 of her children have been diagnosed with influenza.  Wt Readings from Last 3 Encounters:  05/18/17 (!) 328 lb 6 oz (148.9 kg)  05/04/17 (!) 339 lb (153.8 kg)  03/02/17 (!) 376 lb 12.8 oz (170.9 kg)     Review of Systems Admits to cough, last night it was severe and hard to stop.  She has a history of asthma, admits to chest congestion but no wheezing per se Some nausea, no vomiting.   Past Medical History:  Diagnosis Date  . Allergy   . Asthma   . Hypertension   . Multiple thyroid nodules    per patient   . PONV (postoperative nausea and vomiting)    with D+ C and tonsillectomy   . Pre-diabetes     Past Surgical History:  Procedure Laterality Date  . DILATION AND CURETTAGE OF UTERUS    . LAPAROSCOPIC GASTRIC SLEEVE RESECTION N/A 03/02/2017   Procedure: LAPAROSCOPIC GASTRIC SLEEVE RESECTION, UPPER ENDO;  Surgeon: Luretha Murphy, MD;  Location: [facility];  Service: General;  Laterality: N/A;  . TONSILLECTOMY AND ADENOIDECTOMY      Social History   Socioeconomic History  . Marital status: Divorced    Spouse name: Not on file  . Number of children: 2  . Years of education: Not on file  . Highest education level: Not on file  Social Needs  . Financial resource strain: Not on file  . Food insecurity - worry: Not on file  . Food insecurity - inability: Not on file  . Transportation needs - medical: Not on file  . Transportation needs - non-medical: Not on file  Occupational History  . 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 Smoking status: Never  Smoker  . Smokeless tobacco: Never Used  Substance and Sexual Activity  . Alcohol use: No    Alcohol/week: 0.0 oz  . Drug use: No  . Sexual activity: Yes    Partners: Male    Birth control/protection: Condom, IUD  Other Topics Concern  . Not on file  Social History Narrative   G4P2  (2 miscarriages)    Household-- pt and 2 children      Allergies as of 05/18/2017   No Known Allergies     Medication List        Accurate as of 05/18/17  6:14 PM. Always use your most recent med list.          albuterol 108 (90 Base) MCG/ACT inhaler Commonly known as:  VENTOLIN HFA Inhale 1-2 puffs into the lungs every 4 (four) hours as needed for wheezing or shortness of breath.   beclomethasone 80 MCG/ACT inhaler Commonly known as:  QVAR Inhale 2 puffs into the lungs daily.   cetirizine 10 MG tablet Commonly known as:  ZYRTEC Take 1 tablet (10 mg total) by mouth at bedtime.   fluticasone 50 MCG/ACT nasal spray Commonly known as:  FLONASE Place 2 sprays daily into both nostrils.   HYDROcodone-homatropine 5-1.5 MG/5ML syrup Commonly known as:  HYCODAN Take 5 mLs by mouth  at bedtime as needed for cough.   KYLEENA 19.5 MG Iud Generic drug:  Levonorgestrel by Intrauterine route.          Objective:   Physical Exam BP 128/74 (BP Location: Left Wrist, Patient Position: Sitting, Cuff Size: Small)   Pulse 83   Temp 98.8 F (37.1 C) (Oral)   Resp 14   Ht 5\' 5"  (1.651 m)   Wt (!) 328 lb 6 oz (148.9 kg)   SpO2 93%   BMI 54.64 kg/m  General:   Well developed, morbidly obese female, NAD.  HEENT:  Normocephalic . Face symmetric, atraumatic Nose quite congested, sinuses no TTP. Throat not red, symmetric. TMs: Unable to see the left one due to wax, right is slightly bulged but not red. Lungs:  CTA B Normal respiratory effort, no intercostal retractions, no accessory muscle use. Heart: RRR,  no murmur.  No pretibial edema bilaterally  Skin: Not pale. Not  jaundice Neurologic:  alert & oriented X3.  Speech normal, gait appropriate for age and unassisted Psych--  Cognition and judgment appear intact.  Cooperative with normal attention span and concentration.  Behavior appropriate. No anxious or depressed appearing.      Assessment & Plan:    Assessment Prediabetes: A1c 6.0 05-2015 Elevated BP w/o dx of HTN Thyromegaly-nodules (see us 04-2015), bx rx per endo : (-) 06-2016 Asthma; Allergies Morbid obesity- bariatric surgery 02-2017  H/o fibroids  Birth control: IUD 12-2016  PLAN:  Viral syndrome Sx c/w a virus, she has been exposed to influenza, rapid strep and flu test were both negative.  Asthma does not seem to be exacerbated. She has been sick for 3 days, Tamiflu not likely to help.  Will rec  conservative treatment, she is still contagious  Her cough has been severe, small amount of hydrocodone prescribed.

## 2017-05-18 NOTE — Progress Notes (Signed)
Pre visit review using our clinic review tool, if applicable. No additional management support is needed unless otherwise documented below in the visit note. 

## 2017-05-18 NOTE — Assessment & Plan Note (Signed)
PLAN:  Viral syndrome Sx c/w a virus, she has been exposed to influenza, rapid strep and flu test were both negative.  Asthma does not seem to be exacerbated. She has been sick for 3 days, Tamiflu not likely to help.  Will rec  conservative treatment, she is still contagious  Her cough has been severe, small amount of hydrocodone prescribed.

## 2017-05-27 ENCOUNTER — Ambulatory Visit: Payer: BLUE CROSS/BLUE SHIELD | Admitting: Internal Medicine

## 2017-07-06 ENCOUNTER — Encounter: Payer: Self-pay | Admitting: Skilled Nursing Facility1

## 2017-07-06 ENCOUNTER — Encounter: Payer: BLUE CROSS/BLUE SHIELD | Attending: Surgery | Admitting: Skilled Nursing Facility1

## 2017-07-06 DIAGNOSIS — Z029 Encounter for administrative examinations, unspecified: Secondary | ICD-10-CM | POA: Diagnosis not present

## 2017-07-06 NOTE — Progress Notes (Signed)
Post-Operative Sleeve Surgery  Primary concerns today: Post-operative Bariatric Surgery Nutrition Management. Pt states she is taking miralax and colace having bowel movements 1 time a week. Pt states she has not been eating vegetables.  Pt states she likes the faux chicken patties. Pt states she is tired and cold often. Pt thinks poor planning gets in her way of eating properly. Pt states she is in school. Pt states she has not had any sciatic episodes since surgery. Pt states she has been Working on emotional eating with her therapist. Pt states her son is overweight and needs to lose weight.  It seems the pt is struggling with foods that are higher in fat.     Surgery date: 03/02/2017 Surgery type: [facility] Weight today: 339  308.6 Weight Change: 20  TANITA  BODY COMP RESULTS  03/17/2017 05/04/2017 07/06/2017   BMI (kg/m^2) 59.8 56.4 51.4   Fat Mass (lbs) 200.4 176.2 151.2   Fat Free Mass (lbs) 158.8 162.8 157.4   Total Body Water (lbs) 120.6 122.4 117.4   24-hr recall: snaking on Mal[place]key bites throughout the day B (6:30AM): premier shake or maybe half an egg and 1 sausage link Snk (10 AM): sting cheese or half regular yogurt L (PM): lunch meat and cheese Snk (PM): cheese or nuts and cheese and meat (P3) D (PM): 2 wings with the skin Snk (PM): popcorn   Fluid intake: water with flavorings and natures twist: 63 oz Estimated total protein intake: 50  Medications: See List Supplementation: bariatric choice-low in vitamin D (not taking everyday) and taking tums for calcium   Using straws: no Drinking while eating: no Having you been chewing well:no Chewing/swallowing difficulties: no Changes in vision: no Changes to mood/headaches: no Hair loss/Cahnges to skin/Changes to nails: no Any difficulty focusing or concentrating: no Sweating: no Dizziness/Lightheaded: no Palpitations: no  Carbonated beverages: no N/V/D/C/GAS: nausea resolved with eating  and constipation Abdominal Pain: no Dumping syndrome: no  Recent physical activity:  3 days a week water aerobics, 2 days a week the bike, 2 days a resistance training   Progress Towards Goal(s):  In progress.  Handouts given during visit include:  Non starchy veggies + protein   Nutritional Diagnosis:  [place]-3.3 Overweight/obesity related to past poor dietary habits and physical inactivity as evidenced by patient w/ recent sleeve surgery following dietary guidelines for continued weight loss.     Intervention:  Nutrition counseling. Dietitian educated the pt on advancing her diet ot include non-starchy vegetables. Goals: -Have your multivitamin with dinner -Eat beans and see if you have more energy -[place] job with the fluid! -Avoid the skin on chicken wings   -Have the cheese when you first wake up and then have greek yogurt or [place]key bacon or veggie chicken or vanilla protein powder in oatmeal  -Call your doctor about your constipation  -Keep working on eating at the table, chewing well, going slow  Teaching Method Utilized:  Visual Auditory Hands on  Barriers to learning/adherence to lifestyle change: emotional eating   Demonstrated degree of understanding via:  Teach Back   Monitoring/Evaluation:  Dietary intake, exercise, and body weight.

## 2017-07-06 NOTE — Patient Instructions (Addendum)
-  Have your multivitamin with dinner  -Eat beans and see if you have more energy  -[place] job with the fluid!  -Avoid the skin on chicken wings    -Have the cheese when you first wake up and then have greek yogurt or [place]key bacon or veggie chicken or vanilla protein powder in oatmeal   -Call your doctor about your constipation

## 2017-07-20 ENCOUNTER — Ambulatory Visit: Payer: BLUE CROSS/BLUE SHIELD | Admitting: Internal Medicine

## 2017-08-03 ENCOUNTER — Ambulatory Visit: Payer: BLUE CROSS/BLUE SHIELD | Admitting: Skilled Nursing Facility1

## 2017-08-11 ENCOUNTER — Encounter: Payer: BLUE CROSS/BLUE SHIELD | Attending: Surgery | Admitting: Skilled Nursing Facility1

## 2017-08-11 ENCOUNTER — Encounter: Payer: Self-pay | Admitting: Skilled Nursing Facility1

## 2017-08-11 DIAGNOSIS — E669 Obesity, unspecified: Secondary | ICD-10-CM

## 2017-08-11 DIAGNOSIS — Z029 Encounter for administrative examinations, unspecified: Secondary | ICD-10-CM | POA: Diagnosis not present

## 2017-08-11 NOTE — Progress Notes (Signed)
Post-Operative Sleeve Surgery  Primary concerns today: Post-operative Bariatric Surgery Nutrition Management. Pt states she has been using her journal and talking with her therapist about emotional eating. Pt states she has been Writing music as coping instead of food. Pt states she has ben stressed this month. Pt states after 2 tsp of oatmeal she feels heavy. Pt states she is on a seafood kick.  Pt states she is doing miralax 2 times a day still only having 1 bowel movement a week. Pt states she has more energy eating beans and more bowel movements. Pt states she has been feeling Tired, achy, cold, constipated.Pt states she has been taking her multivitmain before dinner which has really helped her to take it every day. Pt states she threw up with eating pizza. Pt states her knee pain has not flared up.     Surgery date: 03/02/2017 Surgery type: sleeve Start weight at [facility]: 366.4 Weight today: 297.8 Weight Change: 10.8  TANITA  BODY COMP RESULTS  03/17/2017 05/04/2017 07/06/2017 08/11/2017   BMI (kg/m^2) 59.8 56.4 51.4 49.6   Fat Mass (lbs) 200.4 176.2 151.2 136.6   Fat Free Mass (lbs) 158.8 162.8 157.4 161.2   Total Body Water (lbs) 120.6 122.4 117.4 119.6   24-hr recall: snaking on [place] bites throughout the day B (5AM): premier shake Snk (9AM): egg and 1 [place] sausage link  Yogurt and string cheese L (PM): [place] breast and carrots or peas Snk (PM):  D (PM): shrimp and imitation crab and maybe broccoli or protein shake  Snk (PM): popcorn or string cheese or p3  Fluid intake: water with flavorings and natures twist: 64 oz Estimated total protein intake: 50  Medications: See List Supplementation: bariatric choice-low in vitamin D (not taking everyday) and taking tums for calcium   Using straws: no Drinking while eating: no Having you been chewing well:no Chewing/swallowing difficulties: no Changes in vision: no Changes to mood/headaches: no Hair loss/Cahnges to skin/Changes  to nails: no Any difficulty focusing or concentrating: no Sweating: no Dizziness/Lightheaded: no Palpitations: no  Carbonated beverages: no N/V/D/C/GAS:  constipation Abdominal Pain: no Dumping syndrome: no  Recent physical activity:  2 days a week water aerobics and walking 5 days 30 minutes   Progress Towards Goal(s):  In progress.  Handouts given during visit include:  Non starchy veggies + protein   Nutritional Diagnosis:  [place]-3.3 Overweight/obesity related to past poor dietary habits and physical inactivity as evidenced by patient w/ recent sleeve surgery following dietary guidelines for continued weight loss.     Intervention:  Nutrition counseling. Dietitian educated the pt on advancing her diet ot include non-starchy vegetables. Goals: -Continue to get your fluid in every day -Aim to have one piece of fruit or beans every day -Continue to aim for vegetables 2 times a day 7 days a week -Continue to take your multi -Try to take at least 2 calcium a day   Teaching Method Utilized:  Visual Auditory Hands on  Barriers to learning/adherence to lifestyle change: emotional eating   Demonstrated degree of understanding via:  Teach Back   Monitoring/Evaluation:  Dietary intake, exercise, and body weight.

## 2017-08-11 NOTE — Patient Instructions (Addendum)
-  Continue to get your fluid in every day  -Aim to have one piece of fruit or beans every day  -Continue to aim for vegetables 2 times a day 7 days a week  -Continue to take your multi  -Try to take at least 2 calcium a day

## 2017-08-21 ENCOUNTER — Other Ambulatory Visit: Payer: Self-pay | Admitting: Internal Medicine

## 2017-09-08 ENCOUNTER — Encounter: Payer: Self-pay | Admitting: Certified Nurse Midwife

## 2017-09-08 ENCOUNTER — Other Ambulatory Visit: Payer: Self-pay

## 2017-09-08 ENCOUNTER — Other Ambulatory Visit: Payer: Self-pay | Admitting: *Deleted

## 2017-09-08 ENCOUNTER — Other Ambulatory Visit (HOSPITAL_COMMUNITY)
Admission: RE | Admit: 2017-09-08 | Discharge: 2017-09-08 | Disposition: A | Discharge status: Home / Self Care | Payer: BLUE CROSS/BLUE SHIELD | Source: Ambulatory Visit | Attending: Certified Nurse Midwife | Admitting: Certified Nurse Midwife

## 2017-09-08 ENCOUNTER — Ambulatory Visit: Payer: BLUE CROSS/BLUE SHIELD | Admitting: Certified Nurse Midwife

## 2017-09-08 VITALS — BP 143/94 | HR 59 | Ht 65.0 in | Wt 293.5 lb

## 2017-09-08 DIAGNOSIS — N760 Acute vaginitis: Secondary | ICD-10-CM | POA: Insufficient documentation

## 2017-09-08 DIAGNOSIS — N898 Other specified noninflammatory disorders of vagina: Secondary | ICD-10-CM | POA: Diagnosis not present

## 2017-09-08 DIAGNOSIS — B9689 Other specified bacterial agents as the cause of diseases classified elsewhere: Secondary | ICD-10-CM | POA: Diagnosis not present

## 2017-09-08 DIAGNOSIS — T839XXA Unspecified complication of genitourinary prosthetic device, implant and graft, initial encounter: Secondary | ICD-10-CM

## 2017-09-08 MED ORDER — METRONIDAZOLE 500 MG PO TABS: 500.0000 mg | ORAL_TABLET | Freq: Two times a day (BID) | ORAL | 0 refills | Status: DC

## 2017-09-08 MED ORDER — SECNIDAZOLE 2 G PO PACK: 1.0000 | PACK | Freq: Once | ORAL | 0 refills | Status: DC

## 2017-09-08 NOTE — Progress Notes (Signed)
Patient ID: Monica Cantu, female   DOB: 1986/01/19, 32 y.o.   MRN: [id number]  Chief Complaint  Patient presents with  . Gynecologic Exam    Pelvic Pain    HPI Monica Cantu is a 32 y.o. female.  Here for malodorous vaginal discharge with lower abdominal cramping. Periods have changed to slightly heavier since weight loss.  Last routine exam was 04/24/15.  Had Kyleena placed on 12/19/16.  S/P gastric sleeve on 03/02/17.  Had TVUS on 04/12/16 that was normal.  Has lost about 100 lbs in 6 months and is doing well.    HPI  Past Medical History:  Diagnosis Date  . Allergy   . Asthma   . Hypertension   . Multiple thyroid nodules    per patient   . PONV (postoperative nausea and vomiting)    with D+ C and tonsillectomy   . Pre-diabetes     Past Surgical History:  Procedure Laterality Date  . DILATION AND CURETTAGE OF UTERUS    . LAPAROSCOPIC GASTRIC SLEEVE RESECTION N/A 03/02/2017   Procedure: LAPAROSCOPIC GASTRIC SLEEVE RESECTION, UPPER ENDO;  Surgeon: Luretha Murphy, MD;  Location: [facility];  Service: General;  Laterality: N/A;  . TONSILLECTOMY AND ADENOIDECTOMY      Family History  Problem Relation Age of Onset  . Hyperlipidemia Father   . Diabetes Maternal Grandmother   . Breast cancer Maternal Grandmother   . Heart disease Paternal Grandmother   . Hypertension Paternal Grandmother   . Stroke Other   . Colon cancer Neg Hx     Social History Social History   Tobacco Use  . Smoking status: Never Smoker  . Smokeless tobacco: Never Used  Substance Use Topics  . Alcohol use: No    Alcohol/week: 0.0 oz  . Drug use: No    No Known Allergies  Current Outpatient Medications  Medication Sig Dispense Refill  . albuterol (VENTOLIN HFA) 108 (90 Base) MCG/ACT inhaler Inhale 1-2 puffs into the lungs every 4 (four) hours as needed for wheezing or shortness of breath. 18 g 5  . beclomethasone (QVAR) 80 MCG/ACT inhaler Inhale 2 puffs into the lungs daily. 8.7 g 5  .  cetirizine (ZYRTEC) 10 MG tablet Take 1 tablet (10 mg total) by mouth at bedtime. 30 tablet 5  . fluticasone (FLONASE) 50 MCG/ACT nasal spray Place 2 sprays daily into both nostrils.    . fluticasone (FLONASE) 50 MCG/ACT nasal spray Place 2 sprays into both nostrils daily as needed for allergies or rhinitis. 16 g 5  . HYDROcodone-homatropine (HYCODAN) 5-1.5 MG/5ML syrup Take 5 mLs by mouth at bedtime as needed for cough. 120 mL 0  . Levonorgestrel (KYLEENA) 19.5 MG IUD by Intrauterine route.    . Secnidazole (SOLOSEC) 2 g PACK Take 1 Package by mouth once for 1 dose. 1 each 0   No current facility-administered medications for this visit.     Review of Systems Review of Systems Constitutional: negative for fatigue and weight loss Respiratory: negative for cough and wheezing Cardiovascular: negative for chest pain, fatigue and palpitations Gastrointestinal: negative for abdominal pain and change in bowel habits Genitourinary:+discharge and lower abdominal cramping Integument/breast: negative for nipple discharge Musculoskeletal:negative for myalgias Neurological: negative for gait problems and tremors Behavioral/Psych: negative for abusive relationship, depression Endocrine: negative for temperature intolerance      Blood pressure (!) 143/94, pulse (!) 59, height 5\' 5"  (1.651 m), weight 293 lb 8 oz (133.1 kg), last menstrual period 08/21/2017.  Physical Exam  Physical Exam General:   alert  Skin:   no rash or abnormalities  Lungs:   clear to auscultation bilaterally  Heart:   regular rate and rhythm, S1, S2 normal, no murmur, click, rub or gallop  Breasts:   deferred  Abdomen:  normal findings: no organomegaly, soft, non-tender and no hernia  Pelvis:  External genitalia: normal general appearance Urinary system: urethral meatus normal and bladder without fullness, nontender Vaginal: normal without tenderness, induration or masses Cervix: no CMT, IUD strings are longer than  expected Adnexa: normal bimanual exam Uterus: anteverted and non-tender, normal size    50% of 20 min visit spent on counseling and coordination of care.   Data Reviewed Previous medical hx, meds, labs  Assessment     1. BV (bacterial vaginosis)    - Cervicovaginal ancillary only - Secnidazole (SOLOSEC) 2 g PACK; Take 1 Package by mouth once for 1 dose.  Dispense: 1 each; Refill: 0  2. Vaginal discharge    - Cervicovaginal ancillary only  3. Complication of intrauterine device (IUD), unspecified complication, initial encounter (HCC)    Condom use encouraged until IUD placement is confirmed - US PELVIC COMPLETE WITH TRANSVAGINAL; Future     Plan    Orders Placed This Encounter  Procedures  . US PELVIC COMPLETE WITH TRANSVAGINAL    Standing Status:   Future    Standing Expiration Date:   11/09/2018    Order Specific Question:   Reason for Exam (SYMPTOM  OR DIAGNOSIS REQUIRED)    Answer:   IUD placement, strings are longer, lower abdominal pain    Order Specific Question:   [facility]?    [facility]   Meds ordered this encounter  Medications  . Secnidazole (SOLOSEC) 2 g PACK    Sig: Take 1 Package by mouth once for 1 dose.    Dispense:  1 each    Refill:  0    Possible management options include: IUD removal and reinsertion Follow up with annual exam after TVUS.

## 2017-09-08 NOTE — Progress Notes (Signed)
Pt request Rx change due to ins coverage. solosec d/c and Flagyl has been ordered per R. Marjo Bickerenney, CNM

## 2017-09-08 NOTE — Progress Notes (Signed)
Presents for cramping pelvic pain 5/10 x 3 days and malodorous discharge x 1 week.

## 2017-09-09 LAB — CERVICOVAGINAL ANCILLARY ONLY
Bacterial vaginitis: POSITIVE — AB
CHLAMYDIA, DNA PROBE: NEGATIVE
Candida vaginitis: NEGATIVE
NEISSERIA GONORRHEA: NEGATIVE
TRICH (WINDOWPATH): NEGATIVE

## 2017-09-10 ENCOUNTER — Encounter: Payer: Self-pay | Admitting: Certified Nurse Midwife

## 2017-09-10 ENCOUNTER — Other Ambulatory Visit: Payer: Self-pay | Admitting: Certified Nurse Midwife

## 2017-09-15 ENCOUNTER — Ambulatory Visit: Payer: BLUE CROSS/BLUE SHIELD

## 2017-09-15 ENCOUNTER — Ambulatory Visit (HOSPITAL_COMMUNITY)
Admission: RE | Admit: 2017-09-15 | Discharge: 2017-09-15 | Disposition: A | Discharge status: Home / Self Care | Payer: BLUE CROSS/BLUE SHIELD | Source: Ambulatory Visit | Attending: Certified Nurse Midwife | Admitting: Certified Nurse Midwife

## 2017-09-15 DIAGNOSIS — R102 Pelvic and perineal pain: Secondary | ICD-10-CM | POA: Diagnosis not present

## 2017-09-15 DIAGNOSIS — Z975 Presence of (intrauterine) contraceptive device: Secondary | ICD-10-CM | POA: Insufficient documentation

## 2017-09-15 DIAGNOSIS — T839XXA Unspecified complication of genitourinary prosthetic device, implant and graft, initial encounter: Secondary | ICD-10-CM

## 2017-09-28 ENCOUNTER — Ambulatory Visit (INDEPENDENT_AMBULATORY_CARE_PROVIDER_SITE_OTHER): Payer: BLUE CROSS/BLUE SHIELD | Admitting: Certified Nurse Midwife

## 2017-09-28 ENCOUNTER — Encounter: Payer: Self-pay | Admitting: Certified Nurse Midwife

## 2017-09-28 ENCOUNTER — Telehealth: Payer: Self-pay | Admitting: Skilled Nursing Facility1

## 2017-09-28 VITALS — BP 136/80 | HR 54 | Ht 64.0 in | Wt 288.4 lb

## 2017-09-28 DIAGNOSIS — Z30432 Encounter for removal of intrauterine contraceptive device: Secondary | ICD-10-CM

## 2017-09-28 DIAGNOSIS — Z30016 Encounter for initial prescription of transdermal patch hormonal contraceptive device: Secondary | ICD-10-CM

## 2017-09-28 DIAGNOSIS — Z30011 Encounter for initial prescription of contraceptive pills: Secondary | ICD-10-CM

## 2017-09-28 MED ORDER — NORELGESTROMIN-ETH ESTRADIOL 150-35 MCG/24HR TD PTWK: 1.0000 | MEDICATED_PATCH | TRANSDERMAL | 0 refills | Status: DC

## 2017-09-28 NOTE — Telephone Encounter (Signed)
Pt called for information about BELT.  Dietitian gave pt their number.

## 2017-09-28 NOTE — Progress Notes (Addendum)
IUD Procedure Note      GYNECOLOGY OFFICE PROCEDURE NOTE  Monica Cantu is a 32 y.o. [id number] [name] No GYN concerns.  Last pap smear was on 12/19/16 and was normal.  On TVUS: IUD malpositioned in lower uterine segment.  [name]  Patient identified, informed consent performed, consent signed.  Patient was in the dorsal lithotomy position, normal external genitalia was noted.  A speculum was placed in the patient's vagina, normal discharge was noted, no lesions. The cervix was visualized, no lesions, no abnormal discharge.  The strings of the IUD were grasped and pulled using ring forceps. The IUD was removed in its entirety. Patient tolerated the procedure well.    Patient will use Xulane patches for contraception.  Routine preventative health maintenance measures emphasized.  Patient is contemplating the Nexplanon as well, wants to try the patches first.  ACHES reviewed.   [name], CNM [facility], [facility]  Addendum: Patient called 10/01/17 to change her contraception plans to Micronor.  Rx sent.

## 2017-09-29 ENCOUNTER — Ambulatory Visit: Payer: BLUE CROSS/BLUE SHIELD | Admitting: Certified Nurse Midwife

## 2017-10-01 MED ORDER — NORETHINDRONE 0.35 MG PO TABS: 1.0000 | ORAL_TABLET | Freq: Every day | ORAL | 11 refills | Status: DC

## 2017-10-01 NOTE — Addendum Note (Signed)
Addended by: Orvilla CornwallENNEY, Haydn Hutsell A on: 10/01/2017 04:56 PM   Modules accepted: Orders

## 2017-11-10 ENCOUNTER — Ambulatory Visit: Payer: BLUE CROSS/BLUE SHIELD | Admitting: Skilled Nursing Facility1

## 2017-11-18 ENCOUNTER — Ambulatory Visit: Payer: BLUE CROSS/BLUE SHIELD

## 2017-12-08 ENCOUNTER — Ambulatory Visit: Payer: BLUE CROSS/BLUE SHIELD | Admitting: Skilled Nursing Facility1

## 2017-12-09 ENCOUNTER — Ambulatory Visit: Payer: BLUE CROSS/BLUE SHIELD | Admitting: Skilled Nursing Facility1

## 2017-12-11 ENCOUNTER — Ambulatory Visit (INDEPENDENT_AMBULATORY_CARE_PROVIDER_SITE_OTHER): Payer: BLUE CROSS/BLUE SHIELD | Admitting: Internal Medicine

## 2017-12-11 ENCOUNTER — Encounter: Payer: Self-pay | Admitting: Internal Medicine

## 2017-12-11 VITALS — BP 126/64 | HR 65 | Temp 98.8°F | Resp 14 | Ht 64.0 in | Wt 269.1 lb

## 2017-12-11 DIAGNOSIS — Z0000 Encounter for general adult medical examination without abnormal findings: Secondary | ICD-10-CM

## 2017-12-11 DIAGNOSIS — Z111 Encounter for screening for respiratory tuberculosis: Secondary | ICD-10-CM

## 2017-12-11 DIAGNOSIS — L0291 Cutaneous abscess, unspecified: Secondary | ICD-10-CM | POA: Diagnosis not present

## 2017-12-11 MED ORDER — KETOCONAZOLE 2 % EX CREA: 1.0000 "application " | TOPICAL_CREAM | Freq: Every day | CUTANEOUS | 1 refills | Status: DC

## 2017-12-11 NOTE — Assessment & Plan Note (Addendum)
--  Tdap 2017. --Female care: sees gyn, not on contraception but will d/w gyn 12-16-17; patient is 52, wonders about a mammogram, recommend to discuss with gynecology. --CCS: no indicated  -Lifestyle improving since bariatric surgery, she is more active and eating healthy -Labs were drawn at Dr. Ermalene Searing office a couple of months ago, reportedly had extend extensive blood work including  iron, diabetes, etc. labs today  : Will only get a TB Gold per patient request.

## 2017-12-11 NOTE — Patient Instructions (Signed)
GO TO THE LAB : Get the blood work     GO TO THE FRONT DESK Schedule your next appointment for a   checkup in 6 months  RASH Apply ketoconazole once or twice a day    Keep the area dry after taking your shower.  May like to use a hair dryer.  Use baby powder to prevent moisture there.  Call if not gradually improving    If the area of the abscess i drained  gets red, swollen or worse please let me know

## 2017-12-11 NOTE — Progress Notes (Signed)
Subjective:    Patient ID: Monica Cantu, female    DOB: [birth year], 32 y.o.   MRN: [id number]  DOS:  12/11/2017 Type of visit - description : cpx Interval history: here for CPX  Wt Readings from Last 3 Encounters:  12/11/17 269 lb 2 oz (122.1 kg)  09/28/17 288 lb 6.4 oz (130.8 kg)  09/08/17 293 lb 8 oz (133.1 kg)     Review of Systems Status post bariatric surgery, doing well. A month ago noted a lump at the mid abdomen were a surgical porth was inserted.  It hurts to touch, has seen no discharge but occasionally notices a "odor" Also for the last several weeks noted a rash of the abdomen and groins.  Taking OTC fungal cream without much benefit.  Other than above, a 14 point review of systems is negative    Past Medical History:  Diagnosis Date  . Allergy   . Asthma   . Hypertension   . Multiple thyroid nodules    per patient   . PONV (postoperative nausea and vomiting)    with D+ C and tonsillectomy   . Pre-diabetes     Past Surgical History:  Procedure Laterality Date  . DILATION AND CURETTAGE OF UTERUS    . LAPAROSCOPIC GASTRIC SLEEVE RESECTION N/A 03/02/2017   Procedure: LAPAROSCOPIC GASTRIC SLEEVE RESECTION, UPPER ENDO;  Surgeon: Luretha Murphy, MD;  Location: [facility];  Service: General;  Laterality: N/A;  . TONSILLECTOMY AND ADENOIDECTOMY      Social History   Socioeconomic History  . Marital status: Divorced    Spouse name: Not on file  . Number of children: 2  . Years of education: Not on file  . Highest education level: Not on file  Occupational History  . Occupation: Personnel officer fro [employer]  . Financial resource strain: Not on file  . Food insecurity:    Worry: Not on file    Inability: Not on file  . Transportation needs:    Medical: Not on file    Non-medical: Not on file  Tobacco Use  . Smoking status: Never Smoker  . Smokeless tobacco: Never Used  Substance and Sexual Activity  . Alcohol use: No   Alcohol/week: 0.0 standard drinks  . Drug use: No  . Sexual activity: Yes    Partners: Male    Birth control/protection: Condom, IUD  Lifestyle  . Physical activity:    Days per week: Not on file    Minutes per session: Not on file  . Stress: Not on file  Relationships  . Social connections:    Talks on phone: Not on file    Gets together: Not on file    Attends religious service: Not on file    Active member of club or organization: Not on file    Attends meetings of clubs or organizations: Not on file    Relationship status: Not on file  . Intimate partner violence:    Fear of current or ex partner: Not on file    Emotionally abused: Not on file    Physically abused: Not on file    Forced sexual activity: Not on file  Other Topics Concern  . Not on file  Social History Narrative   G4P2  (2 miscarriages)    Household-- pt and 2 children  (2006, 2012)     Family History  Problem Relation Age of Onset  . Hyperlipidemia Father   . Diabetes Maternal Grandmother   .  Breast cancer Maternal Grandmother   . Heart disease Paternal Grandmother   . Hypertension Paternal Grandmother   . Stroke Other   . Colon cancer Neg Hx      Allergies as of 12/11/2017   No Known Allergies     Medication List        Accurate as of 12/11/17 11:59 PM. Always use your most recent med list.          albuterol 108 (90 Base) MCG/ACT inhaler Commonly known as:  PROVENTIL HFA;VENTOLIN HFA Inhale 1-2 puffs into the lungs every 4 (four) hours as needed for wheezing or shortness of breath.   beclomethasone 80 MCG/ACT inhaler Commonly known as:  QVAR Inhale 2 puffs into the lungs daily.   cetirizine 10 MG tablet Commonly known as:  ZYRTEC Take 1 tablet (10 mg total) by mouth at bedtime.   ketoconazole 2 % cream Commonly known as:  NIZORAL Apply 1 application topically daily. Area of infection is extensive, please provide 120 g   pantoprazole 40 MG tablet Commonly known as:   PROTONIX Take 40 mg by mouth daily.          Objective:   Physical Exam  Abdominal:    Skin:      BP 126/64 (BP Location: Left Wrist, Patient Position: Sitting, Cuff Size: Small)   Pulse 65   Temp 98.8 F (37.1 C) (Oral)   Resp 14   Ht 5\' 4"  (1.626 m)   Wt 269 lb 2 oz (122.1 kg)   SpO2 96%   BMI 46.20 kg/m  General: Well developed, NAD, see BMI.  Neck: Thyromegaly noted, larger on the right.  Nontender. HEENT:  Normocephalic . Face symmetric, atraumatic Lungs:  CTA B Normal respiratory effort, no intercostal retractions, no accessory muscle use. Heart: RRR,  no murmur.  No pretibial edema bilaterally  Abdomen:  Not distended, soft, non-tender. No rebound or rigidity.   Neurologic:  alert & oriented X3.  Speech normal, gait appropriate for age and unassisted Strength symmetric and appropriate for age.  Psych: Cognition and judgment appear intact.  Cooperative with normal attention span and concentration.  Behavior appropriate. No anxious or depressed appearing.     Assessment & Plan:    Assessment Prediabetes: A1c 6.0 05-2015 Elevated BP w/o dx of HTN Thyromegaly-nodules (see [place] 04-2015), bx rx per endo : (-) 06-2016 Asthma; Allergies Morbid obesity- bariatric surgery 02-2017  H/o fibroids   PLAN:   Prediabetes: RTC 6 months Elevated BP h/o: BP normal Thyroid nodules: Due to see endocrinology, states she plans to keep that appointment. Morbid obesity: Status post bariatric surgery 02-2017, doing great. Abscess, abdomen: Very superficial abscess at the area of previous port insertion for bariatric surgery.  See below. Rash: likely  Intertrigo Procedure note: In a sterile fashion and under local anesthesia with lidocaine 2% without 0.5 mL, I lancet the area with a blade, obtain around 0,75 cc of purulent bloody material.  Sent for culture.  The area was dressed. There is no evidence of cellulitis on exam, will treat with oral antibiotics only if  needed. RTC  6 months

## 2017-12-11 NOTE — Progress Notes (Signed)
Pre visit review using our clinic review tool, if applicable. No additional management support is needed unless otherwise documented below in the visit note. 

## 2017-12-13 LAB — QUANTIFERON-TB GOLD PLUS
Mitogen-NIL: >10 IU/mL
NIL: 0.02 IU/mL
QUANTIFERON-TB GOLD PLUS: NEGATIVE
TB1-NIL: 0.01 IU/mL
TB2-NIL: 0.02 [IU]/mL

## 2017-12-13 NOTE — Assessment & Plan Note (Signed)
Prediabetes: RTC 6 months Elevated BP h/o: BP normal Thyroid nodules: Due to see endocrinology, states she plans to keep that appointment. Morbid obesity: Status post bariatric surgery 02-2017, doing great. Abscess, abdomen: Very superficial abscess at the area of previous port insertion for bariatric surgery.  See below. Rash: likely  Intertrigo Procedure note: In a sterile fashion and under local anesthesia with lidocaine 2% without 0.5 mL, I lancet the area with a blade, obtain around 0,75 cc of purulent bloody material.  Sent for culture.  The area was dressed. There is no evidence of cellulitis on exam, will treat with oral antibiotics only if needed. RTC  6 months

## 2017-12-14 LAB — WOUND CULTURE
MICRO NUMBER: 91068074
RESULT: NO GROWTH
SPECIMEN QUALITY: ADEQUATE

## 2017-12-16 ENCOUNTER — Ambulatory Visit (INDEPENDENT_AMBULATORY_CARE_PROVIDER_SITE_OTHER): Payer: BLUE CROSS/BLUE SHIELD | Admitting: Obstetrics and Gynecology

## 2017-12-16 ENCOUNTER — Encounter: Payer: Self-pay | Admitting: *Deleted

## 2017-12-16 ENCOUNTER — Encounter: Payer: Self-pay | Admitting: Obstetrics and Gynecology

## 2017-12-16 VITALS — BP 119/84 | HR 64 | Ht 64.0 in | Wt 268.6 lb

## 2017-12-16 DIAGNOSIS — Z3046 Encounter for surveillance of implantable subdermal contraceptive: Secondary | ICD-10-CM

## 2017-12-16 DIAGNOSIS — Z3202 Encounter for pregnancy test, result negative: Secondary | ICD-10-CM

## 2017-12-16 DIAGNOSIS — Z30017 Encounter for initial prescription of implantable subdermal contraceptive: Secondary | ICD-10-CM

## 2017-12-16 LAB — POCT URINE PREGNANCY: PREG TEST UR: NEGATIVE

## 2017-12-16 MED ORDER — ETONOGESTREL 68 MG ~~LOC~~ IMPL
68.0000 mg | DRUG_IMPLANT | Freq: Once | SUBCUTANEOUS | Status: AC
  Administered 2017-12-16: 68 mg via SUBCUTANEOUS

## 2017-12-16 NOTE — Progress Notes (Signed)
Patient here for Nexplanon insertion  Patient given informed consent, signed copy in the chart, time out was performed. Pregnancy test was negative. Appropriate time out taken.  Patient's left arm was prepped and draped in the usual sterile fashion.. The ruler used to measure and mark insertion area.  Patient was prepped with alcohol swab and then injected with 2 cc of 1% lidocaine with epinephrine.  Patient was prepped with betadine, Nexplanon removed form packaging.  Device confirmed in needle, then inserted full length of needle and withdrawn per handbook instructions.  Patient insertion site covered with band-aid and [place].   Minimal blood loss.  Patient tolerated the procedure well.

## 2017-12-16 NOTE — Addendum Note (Signed)
Addended by: Dalphine Handing on: 12/16/2017 03:31 PM   Modules accepted: Orders

## 2018-01-06 ENCOUNTER — Telehealth: Payer: Self-pay

## 2018-01-06 NOTE — Telephone Encounter (Signed)
Pt called stating that she is having nausea and headaches every day since she got her nexplanon put in. She states that her blood pressure was also elevated yesterday 139/84. She has see her pcp for these sx, and they referred her back to gyn to see if sx are related to nexplanon placement. Pt is taking zofran daily to help with nausea, and she prefers not take this daily due to it making her sleepy. She has also been taking excedrin and tylenol for headaches with relief. She does have a hx of migraines, which has gotten worse since getting the nexplanon. Please advise

## 2018-01-19 ENCOUNTER — Encounter: Payer: Self-pay | Admitting: Skilled Nursing Facility1

## 2018-01-19 ENCOUNTER — Encounter: Payer: BLUE CROSS/BLUE SHIELD | Attending: Surgery | Admitting: Skilled Nursing Facility1

## 2018-01-19 DIAGNOSIS — Z713 Dietary counseling and surveillance: Secondary | ICD-10-CM | POA: Diagnosis not present

## 2018-01-19 DIAGNOSIS — E669 Obesity, unspecified: Secondary | ICD-10-CM

## 2018-01-19 NOTE — Progress Notes (Signed)
Post-Operative Sleeve Surgery  Primary concerns today: Post-operative Bariatric Surgery Nutrition Management.  Pt states she has been having issues with giving into cravings. Pt states she wants to do a weight loss program. Pt states work has been fast paced and has not had time to eat lunch. Pt states school has been stressful. Pt states animal products do not sit well so she avoids them. Pt states he is also planning a wedding. Pt states she is going to work with Dr. Cyndia Skeeters again. Pt was unaware of the stresses in her life until she began talking about how her life has been going.  Dietitian educated the pt on focusing on her relationship with food rather than finding another diet to follow.    Surgery date: 03/02/2017 Surgery type: sleeve Start weight at [facility]: 366.4 Weight today: 262.8 Weight Change: 35  TANITA  BODY COMP RESULTS  07/06/2017 08/11/2017 01/19/2018   BMI (kg/m^2) 51.4 49.6 45.1   Fat Mass (lbs) 151.2 136.6 125.8   Fat Free Mass (lbs) 157.4 161.2 137   Total Body Water (lbs) 117.4 119.6 101   24-hr recall: snaking on [place] bites throughout the day B (6AM): egg and half a piece of toast Snk (9AM): string cheese and yogurt  L (PM): protein shake or mostly skipped Snk (PM): fruit D (PM): fish or vegetables with brown rice  Snk (PM): half banana and peanut butter  Fluid intake: water with flavorings and sugar free natures twist: 32 oz Estimated total protein intake: 50  Medications: See List Supplementation: not taking multi and 3 tums   Using straws: no Drinking while eating: no Having you been chewing well:no Chewing/swallowing difficulties: no Changes in vision: no Changes to mood/headaches: no Hair loss/Cahnges to skin/Changes to nails: no Any difficulty focusing or concentrating: no Sweating: no Dizziness/Lightheaded: no Palpitations: no  Carbonated beverages: no N/V/D/C/GAS:  constipation Abdominal Pain: no Dumping syndrome: no  Recent physical  activity:  ADL's  Progress Towards Goal(s):  In progress.   Nutritional Diagnosis:  [place]-3.3 Overweight/obesity related to past poor dietary habits and physical inactivity as evidenced by patient w/ recent sleeve surgery following dietary guidelines for continued weight loss.  Intervention:  Nutrition counseling. Dietitian educated the pt on advancing her diet ot include non-starchy vegetables. Goals: -Get the protein blend vegetable bag or Quinoa thrown in your bag of veggies or a glass of soy milk with your veggies -Take some time to rework your day and allow yourself to sleep a MINIMUM of 6 hours every night  -Eat lunch: take your lunch with you and take that 30 minutes! -Try alkaline water or diet juice or lemon in your water or hot water  -Do not drink 15 minutes before you eat do not drink with meals and wait 30 minutes after you have eaten before you drink again  -Get back to walking during lunch -Get appropriate multivitamin in a capsule form, have food on your stomach before you take it  Teaching Method Utilized:  Visual Auditory Hands on  Barriers to learning/adherence to lifestyle change: emotional eating   Demonstrated degree of understanding via:  Teach Back   Monitoring/Evaluation:  Dietary intake, exercise, and body weight.

## 2018-01-19 NOTE — Patient Instructions (Addendum)
-  Get the protein blend vegetable bag or Quinoa thrown in your bag of veggies or a glass of soy milk with your veggies  -Take some time to rework your day and allow yourself to sleep a MINIMUM of 6 hours every night   -Eat lunch: take your lunch with you and take that 30 minutes!  -Try alkaline water or diet juice or lemon in your water or hot water   -Do not drink 15 minutes before you eat do not drink with meals and wait 30 minutes after you have eaten before you drink again   -Get back to walking during lunch  -Get appropriate multivitamin in a capsule form, have food on your stomach before you take it

## 2018-02-02 ENCOUNTER — Other Ambulatory Visit: Payer: Self-pay

## 2018-02-02 ENCOUNTER — Encounter (HOSPITAL_BASED_OUTPATIENT_CLINIC_OR_DEPARTMENT_OTHER): Payer: Self-pay | Admitting: Emergency Medicine

## 2018-02-02 ENCOUNTER — Emergency Department (HOSPITAL_BASED_OUTPATIENT_CLINIC_OR_DEPARTMENT_OTHER)
Admission: EM | Admit: 2018-02-02 | Discharge: 2018-02-02 | Disposition: A | Discharge status: Home / Self Care | Payer: BLUE CROSS/BLUE SHIELD | Attending: Emergency Medicine | Admitting: Emergency Medicine

## 2018-02-02 DIAGNOSIS — Z79899 Other long term (current) drug therapy: Secondary | ICD-10-CM | POA: Insufficient documentation

## 2018-02-02 DIAGNOSIS — I10 Essential (primary) hypertension: Secondary | ICD-10-CM | POA: Diagnosis not present

## 2018-02-02 DIAGNOSIS — Z9884 Bariatric surgery status: Secondary | ICD-10-CM | POA: Diagnosis not present

## 2018-02-02 DIAGNOSIS — R42 Dizziness and giddiness: Secondary | ICD-10-CM | POA: Diagnosis not present

## 2018-02-02 DIAGNOSIS — J45909 Unspecified asthma, uncomplicated: Secondary | ICD-10-CM | POA: Insufficient documentation

## 2018-02-02 LAB — CBC
HEMATOCRIT: 41.3 % (ref 36.0–46.0)
Hemoglobin: 13.2 g/dL (ref 12.0–15.0)
MCH: 29.9 pg (ref 26.0–34.0)
MCHC: 32 g/dL (ref 30.0–36.0)
MCV: 93.4 fL (ref 80.0–100.0)
Platelets: 173 10*3/uL (ref 150–400)
RBC: 4.42 MIL/uL (ref 3.87–5.11)
RDW: 13.1 % (ref 11.5–15.5)
WBC: 6.3 10*3/uL (ref 4.0–10.5)
nRBC: 0 % (ref 0.0–0.2)

## 2018-02-02 LAB — BASIC METABOLIC PANEL
Anion gap: 7 (ref 5–15)
BUN: 11 mg/dL (ref 6–20)
CHLORIDE: 106 mmol/L (ref 98–111)
CO2: 25 mmol/L (ref 22–32)
Calcium: 9.2 mg/dL (ref 8.9–10.3)
Creatinine, Ser: 0.76 mg/dL (ref 0.44–1.00)
GFR calc Af Amer: >60 mL/min (ref >60)
GFR calc non Af Amer: >60 mL/min (ref >60)
GLUCOSE: 83 mg/dL (ref 70–99)
Potassium: 3.6 mmol/L (ref 3.5–5.1)
SODIUM: 138 mmol/L (ref 135–145)

## 2018-02-02 LAB — URINALYSIS, MICROSCOPIC (REFLEX)

## 2018-02-02 LAB — URINALYSIS, ROUTINE W REFLEX MICROSCOPIC
BILIRUBIN URINE: NEGATIVE
GLUCOSE, UA: NEGATIVE mg/dL
KETONES UR: NEGATIVE mg/dL
NITRITE: NEGATIVE
PH: 6 (ref 5.0–8.0)
Protein, ur: 30 mg/dL — AB

## 2018-02-02 LAB — PREGNANCY, URINE: Preg Test, Ur: NEGATIVE

## 2018-02-02 MED ORDER — MECLIZINE HCL 25 MG PO TABS: 25.0000 mg | ORAL_TABLET | Freq: Three times a day (TID) | ORAL | 0 refills | Status: DC | PRN

## 2018-02-02 NOTE — ED Triage Notes (Addendum)
Dizziness, feels like the room is spinning, x several days. Reports some chest pain yesterday. States she has been having her period for 22 days, recently had implant placed for birth control.

## 2018-02-02 NOTE — Discharge Instructions (Addendum)
You are seen in the ER for dizziness.  Based on your symptoms and exam this is most likely from vertigo.  This is treated with meclizine.  Take meclizine every 8 hours for the next 48 hours.  After that, if you start to feel better he can start to cut back and only take as needed.  Vertigo typically worsens with sudden head movements or sitting up.  Caution with driving.  Return to the ER if symptoms worsen, if there is double vision, difficulty with walking, severe headache, loss of vision, stroke symptoms such as facial drooping, numbness weakness or difficulty with speech.

## 2018-02-02 NOTE — ED Provider Notes (Signed)
[facility] EMERGENCY DEPARTMENT Provider Note   CSN: [id number] Arrival date & time: 02/02/18  1828     History   Chief Complaint Chief Complaint  Patient presents with  . Dizziness    HPI Monica Cantu is a 32 y.o. female w/ h/o obesity s/p bariatric surgery is here for evaluation of dizziness. Described as "spinning sensation" and things "tilting". Mild, brief, spontaneously resolves after a few seconds. Dizziness onset yesterday morning while she was in her closet changing. She felt dizzy and held onto the walls, after a few second dizziness resolved and was able to continue with her day without issues.  Dizziness returned again today, brief, resolved.  Associated with transient bilateral blurred vision and slight headache.  Has tried to stay hydrated, otherwise no other therapies attempted for symptoms.  Dizziness seems to occur when moving around, standing up, sitting up, moving head. Improves with time, and sitting down.  Chronic nausea that she attributes to bariatric surgery.  She has chronic sinus congestion, worsening in last 2 months. Has been compliant with zyrtec and flonase daily. No ear pain, muffled hearing, tinnitus.   Denies associated thunderclap headache, vision loss, double vision, vomiting, CP, SOB, palpitations, gait or balance issues.  HPI  Past Medical History:  Diagnosis Date  . Allergy   . Asthma   . Hypertension   . Multiple thyroid nodules    per patient   . PONV (postoperative nausea and vomiting)    with D+ C and tonsillectomy   . Pre-diabetes     Patient Active Problem List   Diagnosis Date Noted  . S/P laparoscopic sleeve gastrectomyNov 2018 03/02/2017  . Encounter for IUD insertion 12/19/2016  . Low back pain radiating to left leg 09/30/2016  . Multinodular goiter 05/27/2016  . Annual physical exam 02/22/2016  . PCP NOTES >>>>>>>>>>>>>>>>>>>>> 05/31/2015  . Asthma 05/11/2015  . Morbid obesity (HCC) 04/24/2015  . Elevated  blood pressure 04/24/2015    Past Surgical History:  Procedure Laterality Date  . DILATION AND CURETTAGE OF UTERUS    . LAPAROSCOPIC GASTRIC SLEEVE RESECTION N/A 03/02/2017   Procedure: LAPAROSCOPIC GASTRIC SLEEVE RESECTION, UPPER ENDO;  Surgeon: Luretha Murphy, MD;  Location: [facility] ORS;  Service: General;  Laterality: N/A;  . TONSILLECTOMY AND ADENOIDECTOMY       OB History    Gravida  4   Para  2   Term  2   Preterm  0   AB  2   Living  2     SAB  2   TAB      Ectopic  0   Multiple      Live Births  2            Home Medications    Prior to Admission medications   Medication Sig Start Date End Date Taking? Authorizing Provider  albuterol (VENTOLIN HFA) 108 (90 Base) MCG/ACT inhaler Inhale 1-2 puffs into the lungs every 4 (four) hours as needed for wheezing or shortness of breath. Patient not taking: Reported on 12/16/2017 05/27/16   Wanda Plump, MD  beclomethasone (QVAR) 80 MCG/ACT inhaler Inhale 2 puffs into the lungs daily. Patient not taking: Reported on 12/16/2017 05/23/16   Wanda Plump, MD  cetirizine (ZYRTEC) 10 MG tablet Take 1 tablet (10 mg total) by mouth at bedtime. Patient not taking: Reported on 12/16/2017 08/11/16   Wanda Plump, MD  ketoconazole (NIZORAL) 2 % cream Apply 1 application topically daily. Area of infection  is extensive, please provide 120 g Patient not taking: Reported on 12/16/2017 12/11/17   Wanda Plump, MD  meclizine (ANTIVERT) 25 MG tablet Take 1 tablet (25 mg total) by mouth 3 (three) times daily as needed for dizziness. 02/02/18   Liberty Handy, PA-C  pantoprazole (PROTONIX) 40 MG tablet Take 40 mg by mouth daily.    [provider]    Family History Family History  Problem Relation Age of Onset  . Hyperlipidemia Father   . Diabetes Maternal Grandmother   . Breast cancer Maternal Grandmother   . Heart disease Paternal Grandmother   . Hypertension Paternal Grandmother   . Stroke Other   . Colon cancer Neg Hx      Social History Social History   Tobacco Use  . Smoking status: Never Smoker  . Smokeless tobacco: Never Used  Substance Use Topics  . Alcohol use: No    Alcohol/week: 0.0 standard drinks  . Drug use: No     Allergies   Patient has no known allergies.   Review of Systems Review of Systems  HENT: Positive for congestion.   Eyes: Positive for visual disturbance.  Gastrointestinal: Positive for nausea (chronic).  Neurological: Positive for dizziness and headaches.  All other systems reviewed and are negative.    Physical Exam Updated Vital Signs BP 132/87 (BP Location: Right Arm)   Pulse 66   Temp 98.9 F (37.2 C) (Oral)   Resp 17   Ht 5\' 4"  (1.626 m)   Wt 120.2 kg   LMP 01/07/2018   SpO2 100%   BMI 45.49 kg/m   Physical Exam  Constitutional: She appears well-developed and well-nourished.  NAD. Non toxic.   HENT:  Head: Normocephalic and atraumatic.  Right Ear: External ear normal.  Left Ear: External ear normal.  Nose: Nose normal.  Mouth/Throat: Oropharynx is clear and moist.  Moderate mucosal edema with erythema, no rhinorrhea. Septum midline. Slight cloudiness to bilateral TMs, but still able to visualize bony landmarks bilaterally. No TM bulging, erythema, perforation.    Eyes: Conjunctivae are normal. Right eye exhibits nystagmus. Left eye exhibits nystagmus.  Neck: Normal range of motion.  Cardiovascular: Normal rate, regular rhythm and normal heart sounds.  Pulmonary/Chest: Effort normal and breath sounds normal.  Musculoskeletal: Normal range of motion.  Neurological:  Speech is fluent without obvious dysarthria or dysphasia. Strength 5/5 with hand grip and ankle F/E.   Sensation to light touch intact in hands and feet. Normal gait. No pronator drift. No leg drop.  Normal finger-to-nose, finger tapping, heel to shin CN II-XII grossly intact bilaterally.  HiNTs Exam: Corrective saccade noted. Left horizontal nystagmus. Normal vertical eye  alignment without vertical skew.   Skin: Skin is warm and dry. Capillary refill takes less than 2 seconds.  Psychiatric: She has a normal mood and affect. Her behavior is normal.     ED Treatments / Results  Labs (all labs ordered are listed, but only abnormal results are displayed) Labs Reviewed  URINALYSIS, ROUTINE W REFLEX MICROSCOPIC - Abnormal; Notable for the following components:      Result Value   Color, Urine AMBER (*)    APPearance CLOUDY (*)    Specific Gravity, Urine >1.030 (*)    Hgb urine dipstick LARGE (*)    Protein, ur 30 (*)    Leukocytes, UA SMALL (*)    All other components within normal limits  URINALYSIS, MICROSCOPIC (REFLEX) - Abnormal; Notable for the following components:   Bacteria, UA FEW (*)  All other components within normal limits  BASIC METABOLIC PANEL  CBC  PREGNANCY, URINE    EKG None  Radiology No results found.  Procedures Procedures (including critical care time)  Medications Ordered in ED Medications - No data to display   Initial Impression / Assessment and Plan / ED Course  I have reviewed the triage vital signs and the nursing notes.  Pertinent labs & imaging results that were available during my care of the patient were reviewed by me and considered in my medical decision making (see chart for details).     32 y.o. yo female presents with intermittent dizziness.  Worse with movement, head rotation. Better with rest. Brief, transient.  No red flag symptoms including focal weakness or sensory deficits to extremities, sudden onset/severe headache, syncope, presyncope, shortness of breath, chest pain, palpitations.  Neuro exam normal, HiNTS is suggestive of peripheral vertigo rather than central cause. Patient symptoms worsened with head rotation, sitting up and standing resolving after < 30 seconds.  She can ambulate.  No diplopia, dysarthria, dysphagia, dysmetria, weakness or sensory loss to extremities.  I personally ambulated  patient who had a steady gait without ataxia or sudden onset of nausea or vomiting.  I did not appreciate any tremors or shakiness while walking. ENT exam shows chronic mucosal edema and slight cloudiness of TMs bilaterally.  VS reassuring.  No hyponatremia or other electrolyte abnormalities. EKG without arrythmias/ischemia.   Patient is considered safe to discharge at this time.  I do not think further lab work or emergent imaging is indicated at this time.  Doubt central cause of symptoms including stroke or TIA.  Doubt cardiac arrhythmia.  Will discharge with meclizine, ENT f/u for persistent symptoms. ED return precautions discussed.  Patient aware of red flag symptoms to monitor for that would warrant return to ED.   Final Clinical Impressions(s) / ED Diagnoses   Final diagnoses:  Vertigo    ED Discharge Orders         Ordered    meclizine (ANTIVERT) 25 MG tablet  3 times daily PRN     02/02/18 2158           Liberty Handy, PA-C 02/03/18 1610    Little, Ambrose [place], MD 02/08/18 1755

## 2018-02-04 ENCOUNTER — Encounter: Payer: Self-pay | Admitting: Internal Medicine

## 2018-02-11 ENCOUNTER — Encounter: Payer: Self-pay | Admitting: Family Medicine

## 2018-02-11 ENCOUNTER — Ambulatory Visit: Payer: BLUE CROSS/BLUE SHIELD | Admitting: Family Medicine

## 2018-02-11 VITALS — BP 122/80 | HR 96 | Temp 98.6°F | Ht 65.0 in | Wt 260.2 lb

## 2018-02-11 DIAGNOSIS — M79604 Pain in right leg: Secondary | ICD-10-CM | POA: Diagnosis not present

## 2018-02-11 DIAGNOSIS — M79605 Pain in left leg: Secondary | ICD-10-CM

## 2018-02-11 LAB — BASIC METABOLIC PANEL
BUN: 11 mg/dL (ref 6–23)
CALCIUM: 9.4 mg/dL (ref 8.4–10.5)
CO2: 28 mEq/L (ref 19–32)
Chloride: 107 mEq/L (ref 96–112)
Creatinine, Ser: 0.73 mg/dL (ref 0.40–1.20)
GFR: 116.79 mL/min (ref >60.00)
GLUCOSE: 79 mg/dL (ref 70–99)
POTASSIUM: 4.1 meq/L (ref 3.5–5.1)
SODIUM: 140 meq/L (ref 135–145)

## 2018-02-11 LAB — IBC PANEL
IRON: 62 ug/dL (ref 42–145)
Saturation Ratios: 18.2 % — ABNORMAL LOW (ref 20.0–50.0)
TRANSFERRIN: 243 mg/dL (ref 212.0–360.0)

## 2018-02-11 LAB — MAGNESIUM: MAGNESIUM: 2 mg/dL (ref 1.5–2.5)

## 2018-02-11 LAB — FERRITIN: FERRITIN: 38.5 ng/mL (ref 10.0–291.0)

## 2018-02-11 NOTE — Progress Notes (Signed)
Musculoskeletal Exam  Patient: Monica Cantu DOB: Feb 16, 1986  DOS: 02/11/2018  SUBJECTIVE:  Chief Complaint:   Chief Complaint  Patient presents with  . Leg Pain    both    Monica Cantu is a 32 y.o.  female for evaluation and treatment of b/l leg pain.   Onset:  2 weeks ago.  No inj or change in activity.  Location: Around legs Character:  aching and burning  Progression of issue:  is worsening Associated symptoms: Feels like she needs to move her legs at night, had a Nexplanon placed 2 mo ago, having heavier bleeding Treatment: to date has been rest.   Neurovascular symptoms: no Rest makes it worse  ROS: Musculoskeletal/Extremities: +b/l leg pain  Past Medical History:  Diagnosis Date  . Allergy   . Asthma   . Hypertension   . Multiple thyroid nodules    per patient   . PONV (postoperative nausea and vomiting)    with D+ C and tonsillectomy   . Pre-diabetes     Objective: VITAL SIGNS: BP 122/80 (BP Location: Left Arm, Patient Position: Sitting, Cuff Size: Large)   Pulse 96   Temp 98.6 F (37 C) (Oral)   Ht 5\' 5"  (1.651 m)   Wt 260 lb 4 oz (118 kg)   SpO2 98%   BMI 43.31 kg/m  Constitutional: Well formed, well developed. No acute distress. Thorax & Lungs: No accessory muscle use Musculoskeletal: b/l leg.   Tenderness to palpation: mild ttp over anterior, lateral and posterior compartment of LLE, no TTP over RLE Deformity: no Ecchymosis: no Tests positive: None Tests negative: Homan's Neurologic: Normal sensory function.  Psychiatric: Normal mood. Age appropriate judgment and insight. Alert & oriented x 3.    Assessment:  Pain in both lower extremities - Plan: Ferritin, IBC panel, Basic metabolic panel, Magnesium  Plan: Ck labs, I think this is probably related to Fe def. Will ck lytes also. Ferrous sulfate rec'd pre-emp. If labs normal will change plan. Very low likelihood for DVT. F/u prn. The patient voiced understanding and agreement to  the plan.   Jilda Roche [place], DO 02/11/18  9:35 AM

## 2018-02-11 NOTE — Patient Instructions (Signed)
Consider taking a multi-vitamin with iron.   Ferrous sulfate (over the counter iron) can be used 3 times daily. It can turn your stool dark and also constipate you.  Give [place] 2-3 business days to get the results of your labs back. If your labs are normal, I will have a different plan.   Let us know if you need anything.

## 2018-02-11 NOTE — Progress Notes (Signed)
Pre visit review using our clinic review tool, if applicable. No additional management support is needed unless otherwise documented below in the visit note. 

## 2018-02-24 ENCOUNTER — Ambulatory Visit (INDEPENDENT_AMBULATORY_CARE_PROVIDER_SITE_OTHER): Payer: BLUE CROSS/BLUE SHIELD | Admitting: Obstetrics & Gynecology

## 2018-02-24 ENCOUNTER — Encounter: Payer: Self-pay | Admitting: Obstetrics & Gynecology

## 2018-02-24 VITALS — BP 128/86 | HR 83 | Ht 65.0 in | Wt 262.1 lb

## 2018-02-24 DIAGNOSIS — Z3046 Encounter for surveillance of implantable subdermal contraceptive: Secondary | ICD-10-CM

## 2018-02-24 NOTE — Patient Instructions (Signed)
Oral Contraception Use Oral contraceptive pills (OCPs) are medicines taken to prevent pregnancy. OCPs work by preventing the ovaries from releasing eggs. The hormones in OCPs also cause the cervical mucus to thicken, preventing the sperm from entering the uterus. The hormones also cause the uterine lining to become thin, not allowing a fertilized egg to attach to the inside of the uterus. OCPs are highly effective when taken exactly as prescribed. However, OCPs do not prevent sexually transmitted diseases (STDs). Safe sex practices, such as using condoms along with an OCP, can help prevent STDs. Before taking OCPs, you may have a physical exam and Pap test. Your health care provider may also order blood tests if necessary. Your health care provider will make sure you are a good candidate for oral contraception. Discuss with your health care provider the possible side effects of the OCP you may be prescribed. When starting an OCP, it can take 2 to 3 months for the body to adjust to the changes in hormone levels in your body. How to take oral contraceptive pills Your health care provider may advise you on how to start taking the first cycle of OCPs. Otherwise, you can:  Start on day 1 of your menstrual period. You will not need any backup contraceptive protection with this start time.  Start on the first Sunday after your menstrual period or the day you get your prescription. In these cases, you will need to use backup contraceptive protection for the first week.  Start the pill at any time of your cycle. If you take the pill within 5 days of the start of your period, you are protected against pregnancy right away. In this case, you will not need a backup form of birth control. If you start at any other time of your menstrual cycle, you will need to use another form of birth control for 7 days. If your OCP is the type called a minipill, it will protect you from pregnancy after taking it for 2 days (48  hours).  After you have started taking OCPs:  If you forget to take 1 pill, take it as soon as you remember. Take the next pill at the regular time.  If you miss 2 or more pills, call your health care provider because different pills have different instructions for missed doses. Use backup birth control until your next menstrual period starts.  If you use a 28-day pack that contains inactive pills and you miss 1 of the last 7 pills (pills with no hormones), it will not matter. Throw away the rest of the non-hormone pills and start a new pill pack.  No matter which day you start the OCP, you will always start a new pack on that same day of the week. Have an extra pack of OCPs and a backup contraceptive method available in case you miss some pills or lose your OCP pack. Follow these instructions at home:  Do not smoke.  Always use a condom to protect against STDs. OCPs do not protect against STDs.  Use a calendar to mark your menstrual period days.  Read the information and directions that came with your OCP. Talk to your health care provider if you have questions. Contact a health care provider if:  You develop nausea and vomiting.  You have abnormal vaginal discharge or bleeding.  You develop a rash.  You miss your menstrual period.  You are losing your hair.  You need treatment for mood swings or depression.  You   get dizzy when taking the OCP.  You develop acne from taking the OCP.  You become pregnant. Get help right away if:  You develop chest pain.  You develop shortness of breath.  You have an uncontrolled or severe headache.  You develop numbness or slurred speech.  You develop visual problems.  You develop pain, redness, and swelling in the legs. This information is not intended to replace advice given to you by your health care provider. Make sure you discuss any questions you have with your health care provider. Document Released: 03/13/2011 Document  Revised: 08/30/2015 Document Reviewed: 09/12/2012 Elsevier Interactive Patient Education  2017 Elsevier Inc.  

## 2018-02-24 NOTE — Progress Notes (Signed)
Pt reqs to remove Nexplanon inserted 12/16/17 d/t prolonged menses. She has rx for BCP.  GYNECOLOGY OFFICE PROCEDURE NOTE  Monica Cantu is a 32 y.o. [phone] here for Nexplanon removal.  Last pap smear was on 12/2016 and was normal.  No other gynecologic concerns.   Nexplanon Removal Patient identified, informed consent performed, consent signed.   Appropriate time out taken. Nexplanon site identified.  Area prepped in usual sterile fashon. One ml of 1% lidocaine was used to anesthetize the area at the distal end of the implant. A small stab incision was made right beside the implant on the distal portion.  The Nexplanon rod was grasped using hemostats and removed without difficulty.  There was minimal blood loss. There were no complications.  3 ml of 1% lidocaine was injected around the incision for post-procedure analgesia.  Steri-strips were applied over the small incision.  A pressure bandage was applied to reduce any bruising.  The patient tolerated the procedure well and was given post procedure instructions.  Patient is planning to use OCP for contraception.     Adam PhenixArnold, Monica Prisk G, MD Attending Obstetrician & Gynecologist, [facility]  02/24/2018

## 2018-02-25 ENCOUNTER — Ambulatory Visit (INDEPENDENT_AMBULATORY_CARE_PROVIDER_SITE_OTHER): Payer: BLUE CROSS/BLUE SHIELD | Admitting: Internal Medicine

## 2018-02-25 ENCOUNTER — Encounter: Payer: Self-pay | Admitting: Internal Medicine

## 2018-02-25 VITALS — BP 120/88 | HR 110 | Temp 99.1°F | Resp 18 | Ht 65.0 in | Wt 260.0 lb

## 2018-02-25 DIAGNOSIS — J029 Acute pharyngitis, unspecified: Secondary | ICD-10-CM | POA: Diagnosis not present

## 2018-02-25 DIAGNOSIS — B349 Viral infection, unspecified: Secondary | ICD-10-CM

## 2018-02-25 LAB — POCT RAPID STREP A (OFFICE): Rapid Strep A Screen: NEGATIVE

## 2018-02-25 LAB — POCT INFLUENZA A/B
INFLUENZA B, POC: NEGATIVE
Influenza A, POC: NEGATIVE

## 2018-02-25 NOTE — Progress Notes (Signed)
Subjective:    Patient ID: Monica Cantu, female    DOB: 01/18/1986, 32 y.o.   MRN: [id number]  DOS:  02/25/2018 Type of visit - description : acute Symptoms started yesterday. She was at work and suddenly developed several episodes of diarrhea, subsequently sore throat and subjective fever. Today, diarrhea is gone but she continue with subjective fever and malaise. 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  Review of Systems  Had some headaches earlier today, better with Tylenol No nausea, vomiting. No rash She has mild cough, mild sinus and chest congestion. Myalgias?  She has generalized aches at baseline, she is not particularly worse.  Past Medical History:  Diagnosis Date  . Allergy   . Asthma   . Hypertension   . Multiple thyroid nodules    per patient   . PONV (postoperative nausea and vomiting)    with D+ C and tonsillectomy   . Pre-diabetes     Past Surgical History:  Procedure Laterality Date  . DILATION AND CURETTAGE OF UTERUS    . LAPAROSCOPIC GASTRIC SLEEVE RESECTION N/A 03/02/2017   Procedure: LAPAROSCOPIC GASTRIC SLEEVE RESECTION, UPPER ENDO;  Surgeon: Luretha MurphyMartin, Matthew, MD;  Location: [facility] ORS;  Service: General;  Laterality: N/A;  . TONSILLECTOMY AND ADENOIDECTOMY      Social History   Socioeconomic History  . Marital status: Divorced    Spouse name: Not on file  . Number of children: 2  . Years of education: Not on file  . Highest education level: Not on file  Occupational History  . Occupation: Personnel officerassitent director fro [employer]  Social Needs  . Financial resource strain: Not on file  . Food insecurity:    Worry: Not on file    Inability: Not on file  . Transportation needs:    Medical: Not on file    Non-medical: Not on file  Tobacco Use  . Smoking status: Never Smoker  . Smokeless tobacco: Never Used  Substance and Sexual Activity  . Alcohol use: No    Alcohol/week: 0.0 standard drinks  . Drug use: No  .  Sexual activity: Yes    Partners: Male    Birth control/protection: Implant  Lifestyle  . Physical activity:    Days per week: Not on file    Minutes per session: Not on file  . Stress: Not on file  Relationships  . Social connections:    Talks on phone: Not on file    Gets together: Not on file    Attends religious service: Not on file    Active member of club or organization: Not on file    Attends meetings of clubs or organizations: Not on file    Relationship status: Not on file  . Intimate partner violence:    Fear of current or ex partner: Not on file    Emotionally abused: Not on file    Physically abused: Not on file    Forced sexual activity: Not on file  Other Topics Concern  . Not on file  Social History Narrative   G4P2  (2 miscarriages)    Household-- pt and 2 children  (2006, 2012)      Allergies as of 02/25/2018   No Known Allergies     Medication List        Accurate as of 02/25/18  4:04 PM. Always use your most recent med list.          albuterol 108 (90 Base)  MCG/ACT inhaler Commonly known as:  PROVENTIL HFA;VENTOLIN HFA Inhale 1-2 puffs into the lungs every 4 (four) hours as needed for wheezing or shortness of breath.   beclomethasone 80 MCG/ACT inhaler Commonly known as:  QVAR Inhale 2 puffs into the lungs daily.   cetirizine 10 MG tablet Commonly known as:  ZYRTEC Take 1 tablet (10 mg total) by mouth at bedtime.   ketoconazole 2 % cream Commonly known as:  NIZORAL Apply 1 application topically daily. Area of infection is extensive, please provide 120 g   meclizine 25 MG tablet Commonly known as:  ANTIVERT Take 1 tablet (25 mg total) by mouth 3 (three) times daily as needed for dizziness.   pantoprazole 40 MG tablet Commonly known as:  PROTONIX Take 40 mg by mouth daily.           Objective:   Physical Exam BP 120/88 (BP Location: Left Arm, Patient Position: Sitting, Cuff Size: Large)   Pulse (!) 110   Temp 99.1 F (37.3  C) (Oral)   Resp 18   Ht 5\' 5"  (1.651 m)   Wt 260 lb (117.9 kg)   LMP 02/24/2018 Comment: Menses since week after 9/11  SpO2 94%   BMI 43.27 kg/m  General:   Well developed, NAD, BMI noted. HEENT:  Normocephalic . Face symmetric, atraumatic. TM: Right normal, left obscured by wax. Throat symmetric is slightly red.  Nose slightly congested, sinuses no TTP. Lungs:  CTA B Normal respiratory effort, no intercostal retractions, no accessory muscle use. Heart: RRR,  no murmur.  No pretibial edema bilaterally Abdomen: Soft, nondistended, slightly TTP at the low sites bilaterally, no mass or rebound. Skin: Not pale. Not jaundice Neurologic:  alert & oriented X3.  Speech normal, gait appropriate for age and unassisted Psych--  Cognition and judgment appear intact.  Cooperative with normal attention span and concentration.  Behavior appropriate. No anxious or depressed appearing.      Assessment & Plan:     Assessment Prediabetes: A1c 6.0 05-2015 Elevated BP w/o dx of HTN Thyromegaly-nodules (see [place] 04-2015), bx rx per endo : (-) 06-2016 Asthma; Allergies Morbid obesity- bariatric surgery 02-2017  H/o fibroids   PLAN:   Viral syndrome: Suspect viral syndrome, strep test and flu test negative. Recommend supportive treatment.  See instructions.

## 2018-02-25 NOTE — Patient Instructions (Addendum)
Rest, fluids , tylenol  If  cough:  Take Mucinex DM twice a day as needed until better  If  nasal congestion: Use OTC   Flonase : 2 nasal sprays on each side of the nose in the morning until you feel better  Call if not gradually better over the next  10 days  Call anytime if the symptoms are severe

## 2018-02-26 NOTE — Assessment & Plan Note (Signed)
Viral syndrome: Suspect viral syndrome, strep test and flu test negative. Recommend supportive treatment.  See instructions.

## 2018-03-15 ENCOUNTER — Encounter: Payer: Self-pay | Admitting: Family

## 2018-03-15 ENCOUNTER — Ambulatory Visit: Payer: BLUE CROSS/BLUE SHIELD | Admitting: Family

## 2018-03-15 ENCOUNTER — Other Ambulatory Visit: Payer: Self-pay

## 2018-03-15 VITALS — BP 134/96 | HR 65 | Temp 99.2°F | Resp 18 | Ht 65.0 in | Wt 261.0 lb

## 2018-03-15 DIAGNOSIS — M545 Low back pain, unspecified: Secondary | ICD-10-CM

## 2018-03-15 LAB — POC URINALSYSI DIPSTICK (AUTOMATED)
Bilirubin, UA: NEGATIVE
Blood, UA: NEGATIVE
Glucose, UA: NEGATIVE
Leukocytes, UA: NEGATIVE
Nitrite, UA: NEGATIVE
PROTEIN UA: POSITIVE — AB
Urobilinogen, UA: 1 E.U./dL
pH, UA: 6 (ref 5.0–8.0)

## 2018-03-15 MED ORDER — MELOXICAM 7.5 MG PO TABS: 7.5000 mg | ORAL_TABLET | Freq: Every day | ORAL | 0 refills | Status: DC

## 2018-03-15 MED ORDER — METHOCARBAMOL 500 MG PO TABS: 500.0000 mg | ORAL_TABLET | Freq: Three times a day (TID) | ORAL | 0 refills | Status: DC | PRN

## 2018-03-15 NOTE — Patient Instructions (Signed)
Please begin meloxicam once daily for your back pain. You may use robaxin as needed for muscle spasm. Call if new/worsening symptoms or if symptoms are not improved in 2 weeks.

## 2018-03-15 NOTE — Progress Notes (Signed)
Subjective:    Patient ID: Monica Cantu, female    DOB: 1986-01-18, 32 y.o.   MRN: [id number]  HPI  Patient is a 32 year old female who presents today with chief complaint of low back pain.  Pain has been present for 1 week.  Initially pain was mild.  Pain worsened on Saturday AM when she woke up.  She denies known injury or previous pain like this.  Pain is described as sharp in nature. Pain is aching in between. She has been using tylenol without much improvement in her pain. Pain is non-radiating. Denies fever or LE weakness or dysuria.   BP Readings from Last 3 Encounters:  03/15/18 (!) 134/96  02/25/18 120/88  02/24/18 128/86     Review of Systems   See HPI  Past Medical History:  Diagnosis Date  . Allergy   . Asthma   . Hypertension   . Multiple thyroid nodules    per patient   . PONV (postoperative nausea and vomiting)    with D+ C and tonsillectomy   . Pre-diabetes      Social History   Socioeconomic History  . Marital status: Divorced    Spouse name: Not on file  . Number of children: 2  . Years of education: Not on file  . Highest education level: Not on file  Occupational History  . Occupation: Personnel officer fro [employer]  . Financial resource strain: Not on file  . Food insecurity:    Worry: Not on file    Inability: Not on file  . Transportation needs:    Medical: Not on file    Non-medical: Not on file  Tobacco Use  . Smoking status: Never Smoker  . Smokeless tobacco: Never Used  Substance and Sexual Activity  . Alcohol use: No    Alcohol/week: 0.0 standard drinks  . Drug use: No  . Sexual activity: Yes    Partners: Male    Birth control/protection: Implant  Lifestyle  . Physical activity:    Days per week: Not on file    Minutes per session: Not on file  . Stress: Not on file  Relationships  . Social connections:    Talks on phone: Not on file    Gets together: Not on file    Attends religious service: Not  on file    Active member of club or organization: Not on file    Attends meetings of clubs or organizations: Not on file    Relationship status: Not on file  . Intimate partner violence:    Fear of current or ex partner: Not on file    Emotionally abused: Not on file    Physically abused: Not on file    Forced sexual activity: Not on file  Other Topics Concern  . Not on file  Social History Narrative   G4P2  (2 miscarriages)    Household-- pt and 2 children  (2006, 2012)    Past Surgical History:  Procedure Laterality Date  . DILATION AND CURETTAGE OF UTERUS    . LAPAROSCOPIC GASTRIC SLEEVE RESECTION N/A 03/02/2017   Procedure: LAPAROSCOPIC GASTRIC SLEEVE RESECTION, UPPER ENDO;  Surgeon: Luretha Murphy, MD;  Location: [facility];  Service: General;  Laterality: N/A;  . TONSILLECTOMY AND ADENOIDECTOMY      Family History  Problem Relation Age of Onset  . Hyperlipidemia Father   . Diabetes Maternal Grandmother   . Breast cancer Maternal Grandmother   . Heart disease Paternal  Grandmother   . Hypertension Paternal Grandmother   . Stroke Other   . Colon cancer Neg Hx     No Known Allergies  Current Outpatient Medications on File Prior to Visit  Medication Sig Dispense Refill  . albuterol (VENTOLIN HFA) 108 (90 Base) MCG/ACT inhaler Inhale 1-2 puffs into the lungs every 4 (four) hours as needed for wheezing or shortness of breath. 18 g 5  . cetirizine (ZYRTEC) 10 MG tablet Take 1 tablet (10 mg total) by mouth at bedtime. 30 tablet 5  . ketoconazole (NIZORAL) 2 % cream Apply 1 application topically daily. Area of infection is extensive, please provide 120 g 120 g 1  . meclizine (ANTIVERT) 25 MG tablet Take 1 tablet (25 mg total) by mouth 3 (three) times daily as needed for dizziness. 30 tablet 0  . pantoprazole (PROTONIX) 40 MG tablet Take 40 mg by mouth daily.     No current facility-administered medications on file prior to visit.     BP (!) 134/96 (BP Location: Left Arm,  Patient Position: Sitting, Cuff Size: Large)   Pulse 65   Temp 99.2 F (37.3 C) (Oral)   Resp 18   Ht 5\' 5"  (1.651 m)   Wt 261 lb (118.4 kg)   LMP 02/24/2018 (Approximate) Comment: Menses since week after 9/11  SpO2 96%   BMI 43.43 kg/m        Objective:   Physical Exam  Constitutional: She is oriented to person, place, and time. She appears well-developed and well-nourished.  Neck: Neck supple. No thyromegaly present.  Cardiovascular: Normal rate, regular rhythm and normal heart sounds.  No murmur heard. Pulmonary/Chest: Effort normal and breath sounds normal. No respiratory distress. She has no wheezes.  Musculoskeletal:       Cervical back: She exhibits no tenderness.       Thoracic back: She exhibits no tenderness.       Lumbar back: She exhibits no tenderness.  Bilateral low back pain tenderness to palpation  Neurological: She is alert and oriented to person, place, and time.  Reflex Scores:      Patellar reflexes are 2+ on the right side and 2+ on the left side. Bilateral LE strength is 5/5  Skin: Skin is warm and dry.  Psychiatric: She has a normal mood and affect. Her behavior is normal. Judgment and thought content normal.          Assessment & Plan:  Lumbar strain- New- advised pt as follows:  Please begin meloxicam once daily for your back pain. You may use robaxin as needed for muscle spasm.  We did discuss red flags that should prompt immediate attention such as bowel or bladder incontinence or lower extremity weakness. Call if new/worsening symptoms or if symptoms are not improved in 2 weeks.

## 2018-03-17 ENCOUNTER — Telehealth: Payer: Self-pay | Admitting: Family

## 2018-03-17 DIAGNOSIS — R809 Proteinuria, unspecified: Secondary | ICD-10-CM

## 2018-03-17 NOTE — Telephone Encounter (Signed)
Please call pt and let her know that there was some trace protein in her urine yesterday. I would like to repeat her urine in 2 weeks.  Likely a false positive.

## 2018-03-18 ENCOUNTER — Other Ambulatory Visit: Payer: Self-pay

## 2018-03-18 DIAGNOSIS — R809 Proteinuria, unspecified: Secondary | ICD-10-CM

## 2018-03-18 NOTE — Progress Notes (Signed)
.  pct

## 2018-03-18 NOTE — Telephone Encounter (Signed)
Patient advised of results and provider's advise. She will come back 03-29-08 for repeat urine ck.

## 2018-03-24 ENCOUNTER — Ambulatory Visit: Payer: BLUE CROSS/BLUE SHIELD | Admitting: Skilled Nursing Facility1

## 2018-03-26 ENCOUNTER — Encounter: Payer: BLUE CROSS/BLUE SHIELD | Admitting: Obstetrics & Gynecology

## 2018-03-29 ENCOUNTER — Other Ambulatory Visit: Payer: Self-pay

## 2018-03-29 ENCOUNTER — Other Ambulatory Visit (INDEPENDENT_AMBULATORY_CARE_PROVIDER_SITE_OTHER): Payer: BLUE CROSS/BLUE SHIELD

## 2018-03-29 ENCOUNTER — Telehealth: Payer: Self-pay | Admitting: Family

## 2018-03-29 DIAGNOSIS — R109 Unspecified abdominal pain: Secondary | ICD-10-CM

## 2018-03-29 DIAGNOSIS — R809 Proteinuria, unspecified: Secondary | ICD-10-CM

## 2018-03-29 DIAGNOSIS — R3129 Other microscopic hematuria: Secondary | ICD-10-CM

## 2018-03-29 LAB — URINALYSIS, ROUTINE W REFLEX MICROSCOPIC
Hgb urine dipstick: NEGATIVE
NITRITE: NEGATIVE
PH: 5.5 (ref 5.0–8.0)
TOTAL PROTEIN, URINE-UPE24: 30 — AB
URINE GLUCOSE: NEGATIVE
UROBILINOGEN UA: 0.2 (ref 0.0–1.0)

## 2018-03-29 MED ORDER — CEPHALEXIN 500 MG PO CAPS: 500.0000 mg | ORAL_CAPSULE | Freq: Two times a day (BID) | ORAL | 0 refills | Status: DC

## 2018-03-29 NOTE — Telephone Encounter (Signed)
Patient advised to come in tomorrow at 11am for urine pregnancy test and CT [facility] at 11:30. All paperwork left for Gwenn to call early tomorrow am for precert (she was gone for the day today).

## 2018-03-29 NOTE — Telephone Encounter (Signed)
Reviewed UA results with patient.  She notes ongoing low back pain R>L.  Had some dysuria earlier today. Advised rx with abx and CT to rule out kidney stone.    Monica Cantu- I have sent rx for abx to her pharmacy and placed an order for CT.  Please see if Monica Cantu can get it precerted and scheduled for tomorrow.  We will need a urine hcg prior to CT.   Can they add it on to today's sample? Also, due to the finding of some protein in her urine, I would recommend that she follow up with Dr. [name] in 2 weeks so he can follow up on this.

## 2018-03-30 ENCOUNTER — Other Ambulatory Visit (INDEPENDENT_AMBULATORY_CARE_PROVIDER_SITE_OTHER): Payer: BLUE CROSS/BLUE SHIELD

## 2018-03-30 ENCOUNTER — Ambulatory Visit (HOSPITAL_BASED_OUTPATIENT_CLINIC_OR_DEPARTMENT_OTHER)
Admission: RE | Admit: 2018-03-30 | Discharge: 2018-03-30 | Disposition: A | Discharge status: Home / Self Care | Payer: BLUE CROSS/BLUE SHIELD | Source: Ambulatory Visit | Attending: Family | Admitting: Family

## 2018-03-30 DIAGNOSIS — R3129 Other microscopic hematuria: Secondary | ICD-10-CM | POA: Diagnosis not present

## 2018-03-30 DIAGNOSIS — R109 Unspecified abdominal pain: Secondary | ICD-10-CM | POA: Insufficient documentation

## 2018-03-30 LAB — POCT URINE PREGNANCY: PREG TEST UR: NEGATIVE

## 2018-04-01 ENCOUNTER — Telehealth: Payer: Self-pay | Admitting: Internal Medicine

## 2018-04-01 NOTE — Telephone Encounter (Signed)
Pt. Completed urine Hcg and CT.

## 2018-04-01 NOTE — Telephone Encounter (Signed)
Pt notified of results. Pt states she is feel better.

## 2018-04-01 NOTE — Telephone Encounter (Signed)
Please call the patient, advised a CT is negative.  Please asked the patient how she is doing and let me know

## 2018-04-06 ENCOUNTER — Encounter: Payer: BLUE CROSS/BLUE SHIELD | Attending: Surgery | Admitting: Skilled Nursing Facility1

## 2018-04-06 ENCOUNTER — Encounter: Payer: Self-pay | Admitting: Skilled Nursing Facility1

## 2018-04-06 DIAGNOSIS — Z713 Dietary counseling and surveillance: Secondary | ICD-10-CM | POA: Insufficient documentation

## 2018-04-06 DIAGNOSIS — E669 Obesity, unspecified: Secondary | ICD-10-CM

## 2018-04-06 NOTE — Progress Notes (Signed)
Post-Operative Sleeve Surgery  Primary concerns today: Post-operative Bariatric Surgery Nutrition Management.  Pt states she thinks she is getting an allergy to shrimp. Pt states she feels nausea with anything she drinks. Pt states she has been meaning to put an alarm in her phone to remind her to take her multivitamin: Dietitian had her do it in the appointment. Pt states her bowel movements have been getting better. Pt states she has started to not bring home with her and have boundaries with her family.    Surgery date: 03/02/2017 Surgery type: sleeve [facility]: 366.4 Weight today: 264 Weight Change: +2  TANITA  BODY COMP RESULTS  07/06/2017 08/11/2017 01/19/2018 04/06/2018   BMI (kg/m^2) 51.4 49.6 45.1 45.3   Fat Mass (lbs) 151.2 136.6 125.8 119.6   Fat Free Mass (lbs) 157.4 161.2 137 144.4   Total Body Water (lbs) 117.4 119.6 101 106.6   24-hr recall: 1 meal but mostly snacking on Saturday and sunday; 5 days a week 1 meal and mostly snacking  B (6AM): raisin bran or oatmeal dry or with almond milk Snk (9AM): low fat danimal (served at day care) L (PM): veggie nuggets and half bag of frozen veggies  Snk (PM): fruit or yogurt or cheese  D (PM): fish or vegetables with brown rice or black bean burger  Snk (PM): half banana and peanut butter  Fluid intake: water with flavorings, diet cranberry juice, and sugar free natures twist: 32 oz Estimated total protein intake: 60  Medications: See List Supplementation: multi (inconsistant) and 2 tums and iron due to iron deficiency   Using straws: no Drinking while eating: no Having you been chewing well:no Chewing/swallowing difficulties: no Changes in vision: no Changes to mood/headaches: no Hair loss/Cahnges to skin/Changes to nails: no Any difficulty focusing or concentrating: no Sweating: no Dizziness/Lightheaded: no Palpitations: no  Carbonated beverages: no N/V/D/C/GAS:  no Abdominal Pain: no Dumping syndrome:  no  Recent physical activity:  2 days a week   Progress Towards Goal(s):  In progress.   Nutritional Diagnosis:  -3.3 Overweight/obesity related to past poor dietary habits and physical inactivity as evidenced by patient w/ recent sleeve surgery following dietary guidelines for continued weight loss.  Intervention:  Nutrition counseling. Dietitian educated the pt on advancing her diet ot include non-starchy vegetables. Goals: -Aim for 64 fluid ounces of hot fluid: chai tea, herbal tea (peppermint, ginger, hibiscus, etc.) or heated sugar free juice, broth  -Get back into your routine of 5 days a week of activity like dancing  -Get back into being more intentional with your meals and snacks: eating until satisfaction not fullness  -Keep your eating out to once a week   Teaching Method Utilized:  Visual Auditory Hands on  Barriers to learning/adherence to lifestyle change: emotional eating   Demonstrated degree of understanding via:  Teach Back   Monitoring/Evaluation:  Dietary intake, exercise, and body weight.

## 2018-04-06 NOTE — Patient Instructions (Addendum)
-  Aim for 64 fluid ounces of hot fluid: chai tea, herbal tea (peppermint, ginger, hibiscus, etc.) or heated sugar free juice, broth   -Get back into your routine of 5 days a week of activity like dancing   -Get back into being more intentional with your meals and snacks: eating until satisfaction not fullness   -Keep your eating out to once a week

## 2018-04-09 ENCOUNTER — Telehealth: Payer: Self-pay | Admitting: *Deleted

## 2018-04-09 NOTE — Telephone Encounter (Signed)
Received letter from patient stating that her "insurance has just changed to [employer] and Dr. Drue Novel is not in the network". She was told to submit a continuity of care form to see if she could still be seen by Dr. Drue Novel, if possible. Forwarded letter with attached forms to provider, as this will be his choice/SLS 01/03

## 2018-04-13 NOTE — Telephone Encounter (Addendum)
Form reviewed- it is actually for Pt to complete, however Dr. Drue Novel has attached a letter with request for him to continue seeing Pt that she can send with form once she completes. I will send her a MyChart message informing her of above. Form and letter placed at front desk for pick up at her convenience.

## 2018-05-18 ENCOUNTER — Other Ambulatory Visit: Payer: Self-pay

## 2018-05-18 MED ORDER — ALBUTEROL SULFATE HFA 108 (90 BASE) MCG/ACT IN AERS: 1.0000 | INHALATION_SPRAY | RESPIRATORY_TRACT | 5 refills | Status: DC | PRN

## 2018-06-15 ENCOUNTER — Ambulatory Visit: Payer: BLUE CROSS/BLUE SHIELD | Admitting: Internal Medicine

## 2018-07-06 ENCOUNTER — Ambulatory Visit: Payer: BLUE CROSS/BLUE SHIELD | Admitting: Skilled Nursing Facility1

## 2018-11-03 ENCOUNTER — Encounter: Payer: Self-pay | Admitting: Internal Medicine

## 2018-11-07 ENCOUNTER — Other Ambulatory Visit: Payer: Self-pay

## 2018-11-07 ENCOUNTER — Encounter (HOSPITAL_BASED_OUTPATIENT_CLINIC_OR_DEPARTMENT_OTHER): Payer: Self-pay | Admitting: Emergency Medicine

## 2018-11-07 ENCOUNTER — Emergency Department (HOSPITAL_BASED_OUTPATIENT_CLINIC_OR_DEPARTMENT_OTHER): Payer: Medicaid Other

## 2018-11-07 ENCOUNTER — Emergency Department (HOSPITAL_BASED_OUTPATIENT_CLINIC_OR_DEPARTMENT_OTHER)
Admission: EM | Admit: 2018-11-07 | Discharge: 2018-11-07 | Disposition: A | Discharge status: Home / Self Care | Payer: Medicaid Other | Attending: Emergency Medicine | Admitting: Emergency Medicine

## 2018-11-07 DIAGNOSIS — Z79899 Other long term (current) drug therapy: Secondary | ICD-10-CM | POA: Insufficient documentation

## 2018-11-07 DIAGNOSIS — R1012 Left upper quadrant pain: Secondary | ICD-10-CM

## 2018-11-07 DIAGNOSIS — M549 Dorsalgia, unspecified: Secondary | ICD-10-CM | POA: Insufficient documentation

## 2018-11-07 DIAGNOSIS — I10 Essential (primary) hypertension: Secondary | ICD-10-CM | POA: Diagnosis not present

## 2018-11-07 DIAGNOSIS — J45909 Unspecified asthma, uncomplicated: Secondary | ICD-10-CM | POA: Diagnosis not present

## 2018-11-07 LAB — CBC
HCT: 38.9 % (ref 36.0–46.0)
Hemoglobin: 12.1 g/dL (ref 12.0–15.0)
MCH: 28.7 pg (ref 26.0–34.0)
MCHC: 31.1 g/dL (ref 30.0–36.0)
MCV: 92.2 fL (ref 80.0–100.0)
Platelets: 172 10*3/uL (ref 150–400)
RBC: 4.22 MIL/uL (ref 3.87–5.11)
RDW: 13.1 % (ref 11.5–15.5)
WBC: 6.4 10*3/uL (ref 4.0–10.5)
nRBC: 0 % (ref 0.0–0.2)

## 2018-11-07 LAB — COMPREHENSIVE METABOLIC PANEL
ALT: 13 U/L (ref 0–44)
AST: 18 U/L (ref 15–41)
Albumin: 3.6 g/dL (ref 3.5–5.0)
Alkaline Phosphatase: 46 U/L (ref 38–126)
Anion gap: 9 (ref 5–15)
BUN: 9 mg/dL (ref 6–20)
CO2: 25 mmol/L (ref 22–32)
Calcium: 9 mg/dL (ref 8.9–10.3)
Chloride: 106 mmol/L (ref 98–111)
Creatinine, Ser: 0.67 mg/dL (ref 0.44–1.00)
GFR calc Af Amer: >60 mL/min (ref >60)
GFR calc non Af Amer: >60 mL/min (ref >60)
Glucose, Bld: 78 mg/dL (ref 70–99)
Potassium: 3.7 mmol/L (ref 3.5–5.1)
Sodium: 140 mmol/L (ref 135–145)
Total Bilirubin: 0.4 mg/dL (ref 0.3–1.2)
Total Protein: 7.1 g/dL (ref 6.5–8.1)

## 2018-11-07 LAB — LIPASE, BLOOD: Lipase: 36 U/L (ref 11–51)

## 2018-11-07 LAB — URINALYSIS, ROUTINE W REFLEX MICROSCOPIC
Bilirubin Urine: NEGATIVE
Glucose, UA: NEGATIVE mg/dL
Hgb urine dipstick: NEGATIVE
Ketones, ur: NEGATIVE mg/dL
Leukocytes,Ua: NEGATIVE
Nitrite: NEGATIVE
Protein, ur: NEGATIVE mg/dL
Specific Gravity, Urine: >1.03 — ABNORMAL HIGH (ref 1.005–1.030)
pH: 6 (ref 5.0–8.0)

## 2018-11-07 LAB — PREGNANCY, URINE: Preg Test, Ur: NEGATIVE

## 2018-11-07 MED ORDER — IOHEXOL 300 MG/ML  SOLN
100.0000 mL | Freq: Once | INTRAMUSCULAR | Status: AC | PRN
  Administered 2018-11-07: 15:00:00 100 mL via INTRAVENOUS

## 2018-11-07 MED ORDER — METHOCARBAMOL 500 MG PO TABS: 500.0000 mg | ORAL_TABLET | Freq: Two times a day (BID) | ORAL | 0 refills | Status: DC

## 2018-11-07 MED ORDER — ACETAMINOPHEN 500 MG PO TABS: 1000.0000 mg | ORAL_TABLET | Freq: Three times a day (TID) | ORAL | 0 refills | Status: DC | PRN

## 2018-11-07 NOTE — Discharge Instructions (Addendum)
Take Tylenol every 8 hours as prescribed, as needed for your pain.  Take Robaxin twice daily as needed for muscle pain or spasms.  Do not drive or operate machinery while taking this medication.  Use ice and heat alternating 20 minutes on, 20 minutes off.  Please follow-up with your doctor if your symptoms are not improving.  Please return to the emergency department you develop any new or worsening symptoms, including persistent fever of 100.4, persistent diarrhea, bloody stools, or any other concerning symptoms.

## 2018-11-07 NOTE — ED Triage Notes (Addendum)
LLQ pain radiating into her back since yesterday. Denies urinary symptoms and vaginal discharge. Pt took 800mg  ibuprofen at 1130

## 2018-11-07 NOTE — ED Notes (Signed)
CT awaiting results from C[facility] prior to imaging; per radiology protocol 

## 2018-11-07 NOTE — ED Provider Notes (Signed)
[facility] EMERGENCY DEPARTMENT Provider Note   CSN: [id number] Arrival date & time: 11/07/18  1211    History   Chief Complaint Chief Complaint  Patient presents with  . Abdominal Pain    HPI Monica Cantu is a 33 y.o. female with history of hypertension, asthma, obesity, status post laparoscopic gastric sleeve November 2018 presents with left upper quadrant pain radiating to her back that began last night.  She describes it as a intermittently sharp and dull ache.  It is worse with movement.  She denies any associated symptoms such as fever, chest pain, shortness of breath, nausea, vomiting, urinary symptoms, abnormal vaginal bleeding or discharge.  Patient does not drink alcohol.  She is not regularly NSAID medication, but did take an ibuprofen last night without relief.  She reports a distant history of kidney stones.  She cannot relate this pain to anything she has had in the past.     HPI  Past Medical History:  Diagnosis Date  . Allergy   . Asthma   . Hypertension   . Multiple thyroid nodules    per patient   . PONV (postoperative nausea and vomiting)    with D+ C and tonsillectomy   . Pre-diabetes     Patient Active Problem List   Diagnosis Date Noted  . S/P laparoscopic sleeve gastrectomyNov 2018 03/02/2017  . Low back pain radiating to left leg 09/30/2016  . Multinodular goiter 05/27/2016  . Annual physical exam 02/22/2016  . PCP NOTES >>>>>>>>>>>>>>>>>>>>> 05/31/2015  . Asthma 05/11/2015  . Morbid obesity (HCC) 04/24/2015  . Elevated blood pressure 04/24/2015    Past Surgical History:  Procedure Laterality Date  . DILATION AND CURETTAGE OF UTERUS    . LAPAROSCOPIC GASTRIC SLEEVE RESECTION N/A 03/02/2017   Procedure: LAPAROSCOPIC GASTRIC SLEEVE RESECTION, UPPER ENDO;  Surgeon: Luretha MurphyMartin, Matthew, MD;  Location: [facility];  Service: General;  Laterality: N/A;  . TONSILLECTOMY AND ADENOIDECTOMY       OB History    Gravida  4   Para  2   Term   2   Preterm  0   AB  2   Living  2     SAB  2   TAB      Ectopic  0   Multiple      Live Births  2            Home Medications    Prior to Admission medications   Medication Sig Start Date End Date Taking? Authorizing Provider  albuterol (VENTOLIN HFA) 108 (90 Base) MCG/ACT inhaler Inhale 1-2 puffs into the lungs every 4 (four) hours as needed for wheezing or shortness of breath. 05/18/18  Yes Paz, Nolon RodJose E, MD  cetirizine (ZYRTEC) 10 MG tablet Take 1 tablet (10 mg total) by mouth at bedtime. 08/11/16  Yes Paz, Nolon RodJose E, MD  meclizine (ANTIVERT) 25 MG tablet Take 1 tablet (25 mg total) by mouth 3 (three) times daily as needed for dizziness. 02/02/18  Yes Sharen HeckGibbons, Claudia J, PA-C  pantoprazole (PROTONIX) 40 MG tablet Take 40 mg by mouth daily.   Yes [provider]  acetaminophen (TYLENOL) 500 MG tablet Take 2 tablets (1,000 mg total) by mouth every 8 (eight) hours as needed for moderate pain. 11/07/18   Verdis PrimeLaw, Aura Bibby M, PA-C  FLOVENT HFA 110 MCG/ACT inhaler  05/18/18   [provider]  ketoconazole (NIZORAL) 2 % cream Apply 1 application topically daily. Area of infection is extensive, please provide  120 g 12/11/17   Colon Branch, MD  methocarbamol (ROBAXIN) 500 MG tablet Take 1 tablet (500 mg total) by mouth 2 (two) times daily. 11/07/18   Frederica Kuster, PA-C    Family History Family History  Problem Relation Age of Onset  . Hyperlipidemia Father   . Diabetes Maternal Grandmother   . Breast cancer Maternal Grandmother   . Heart disease Paternal Grandmother   . Hypertension Paternal Grandmother   . Stroke Other   . Colon cancer Neg Hx     Social History Social History   Tobacco Use  . Smoking status: Never Smoker  . Smokeless tobacco: Never Used  Substance Use Topics  . Alcohol use: No    Alcohol/week: 0.0 standard drinks  . Drug use: No     Allergies   Patient has no known allergies.   Review of Systems Review of Systems  Constitutional:  Negative for chills and fever.  HENT: Negative for facial swelling and sore throat.   Respiratory: Negative for shortness of breath.   Cardiovascular: Negative for chest pain.  Gastrointestinal: Positive for abdominal pain. Negative for nausea and vomiting.  Genitourinary: Negative for dysuria.  Musculoskeletal: Positive for back pain.  Skin: Negative for rash and wound.  Neurological: Negative for headaches.  Psychiatric/Behavioral: The patient is not nervous/anxious.      Physical Exam Updated Vital Signs BP 122/79 (BP Location: Left Wrist)   Pulse 70   Temp 98.5 F (36.9 C) (Oral)   Resp 16   Ht 5\' 5"  (1.651 m)   Wt 136.1 kg   LMP 10/25/2018   SpO2 100%   BMI 49.92 kg/m   Physical Exam Vitals signs and nursing note reviewed.  Constitutional:      General: She is not in acute distress.    Appearance: She is well-developed. She is obese. She is not diaphoretic.  HENT:     Head: Normocephalic and atraumatic.     Mouth/Throat:     Pharynx: No oropharyngeal exudate.  Eyes:     General: No scleral icterus.       Right eye: No discharge.        Left eye: No discharge.     Conjunctiva/sclera: Conjunctivae normal.     Pupils: Pupils are equal, round, and reactive to light.  Neck:     Musculoskeletal: Normal range of motion and neck supple.     Thyroid: No thyromegaly.  Cardiovascular:     Rate and Rhythm: Normal rate and regular rhythm.     Heart sounds: Normal heart sounds. No murmur. No friction rub. No gallop.   Pulmonary:     Effort: Pulmonary effort is normal. No respiratory distress.     Breath sounds: Normal breath sounds. No stridor. No wheezing or rales.  Abdominal:     General: Bowel sounds are normal. There is no distension.     Palpations: Abdomen is soft.     Tenderness: There is abdominal tenderness in the left upper quadrant. There is no guarding or rebound.    Musculoskeletal:       Back:  Lymphadenopathy:     Cervical: No cervical adenopathy.   Skin:    General: Skin is warm and dry.     Coloration: Skin is not pale.     Findings: No rash.     Comments: No pain to the abdomen and back on light touch, no rash noted  Neurological:     Mental Status: She is alert.  Coordination: Coordination normal.      ED Treatments / Results  Labs (all labs ordered are listed, but only abnormal results are displayed) Labs Reviewed  URINALYSIS, ROUTINE W REFLEX MICROSCOPIC - Abnormal; Notable for the following components:      Result Value   Specific Gravity, Urine >1.030 (*)    All other components within normal limits  PREGNANCY, URINE  LIPASE, BLOOD  COMPREHENSIVE METABOLIC PANEL  CBC    EKG None  Radiology Ct Abdomen Pelvis W Contrast  Result Date: 11/07/2018 CLINICAL DATA:  right sided pain x 2 days; nausea, but that is daily and normal for her; no diarrhea or constipation; no vomiting; pt has h/o gastric sleeve surgery almost 2 years ago; h/o kidney stones years ago; no urinary issuesH/o HTN; asthma; EXAM: CT ABDOMEN AND PELVIS WITH CONTRAST TECHNIQUE: Multidetector CT imaging of the abdomen and pelvis was performed using the standard protocol following bolus administration of intravenous contrast. CONTRAST:  100mL OMNIPAQUE IOHEXOL 300 MG/ML  SOLN COMPARISON:  03/30/2018 FINDINGS: Lower chest: No acute abnormality. Hepatobiliary: No focal liver abnormality is seen. No gallstones, gallbladder wall thickening, or biliary dilatation. Pancreas: Unremarkable. No pancreatic ductal dilatation or surrounding inflammatory changes. Spleen: Normal in size without focal abnormality. Adrenals/Urinary Tract: Adrenal glands are unremarkable. Kidneys are normal, without renal calculi, focal lesion, or hydronephrosis. Bladder is unremarkable. Stomach/Bowel: Surgical staple line along the greater curvature of the stomach which is nondistended. Small bowel decompressed. Normal appendix. Colon is nondilated, unremarkable. Vascular/Lymphatic: Dilated  left GSV at the saphenofemoral junction. No other vascular abnormality. No abdominal or pelvic adenopathy. Reproductive: Uterus and bilateral adnexa are unremarkable. Other: Bilateral pelvic phleboliths. No ascites. No free air. Musculoskeletal: No acute or significant osseous findings. IMPRESSION: No acute findings. Mild Electronically Signed   By: Corlis Leak  Hassell M.D.   On: 11/07/2018 15:03    Procedures Procedures (including critical care time)  Medications Ordered in ED Medications  iohexol (OMNIPAQUE) 300 MG/ML solution 100 mL (100 mLs Intravenous Contrast Given 11/07/18 1439)     Initial Impression / Assessment and Plan / ED Course  I have reviewed the triage vital signs and the nursing notes.  Pertinent labs & imaging results that were available during my care of the patient were reviewed by me and considered in my medical decision making (see chart for details).        Patient presenting with a 1 day history of left upper quadrant pain.  Labs are unremarkable.  UA and U pregnancy are negative.  CT abdomen pelvis shows no acute findings and surgical site with no abnormalities.  I confirmed that the word "mild" seen on the impression was a typo after another radiologist, Dr. Bradly ChrisStroud, reviewed as well and saw no abnormalities.  Suspect musculoskeletal pain, especially since pain is really only present with movement.  Will treat with Tylenol, Robaxin, ice, heat.  Patient advised to avoid NSAIDs considering her gastric sleeve.  Follow-up to PCP as needed.  Return precautions discussed.  Patient understands and agrees with plan.  Patient vitals stable throughout ED course and discharged in satisfactory condition.  Final Clinical Impressions(s) / ED Diagnoses   Final diagnoses:  Left upper quadrant pain    ED Discharge Orders         Ordered    methocarbamol (ROBAXIN) 500 MG tablet  2 times daily     11/07/18 1533    acetaminophen (TYLENOL) 500 MG tablet  Every 8 hours PRN     11/07/18  1533  Emi HolesLaw, Nikala Walsworth M, PA-C 11/07/18 1535    Cathren LaineSteinl, Kevin, MD 11/07/18 [phone]

## 2018-11-07 NOTE — ED Notes (Signed)
Attempt x 1 for PIV right AC. Blood obtained for labs but unable to thread catheter

## 2018-12-29 ENCOUNTER — Ambulatory Visit: Payer: Medicaid Other | Admitting: Dietician

## 2019-01-15 ENCOUNTER — Emergency Department (HOSPITAL_BASED_OUTPATIENT_CLINIC_OR_DEPARTMENT_OTHER)
Admission: EM | Admit: 2019-01-15 | Discharge: 2019-01-15 | Disposition: A | Discharge status: Home / Self Care | Payer: Medicaid Other | Attending: Emergency Medicine | Admitting: Emergency Medicine

## 2019-01-15 ENCOUNTER — Emergency Department (HOSPITAL_BASED_OUTPATIENT_CLINIC_OR_DEPARTMENT_OTHER): Payer: Medicaid Other

## 2019-01-15 ENCOUNTER — Other Ambulatory Visit: Payer: Self-pay

## 2019-01-15 ENCOUNTER — Encounter (HOSPITAL_BASED_OUTPATIENT_CLINIC_OR_DEPARTMENT_OTHER): Payer: Self-pay | Admitting: Emergency Medicine

## 2019-01-15 DIAGNOSIS — Z79899 Other long term (current) drug therapy: Secondary | ICD-10-CM | POA: Diagnosis not present

## 2019-01-15 DIAGNOSIS — Z9884 Bariatric surgery status: Secondary | ICD-10-CM | POA: Diagnosis not present

## 2019-01-15 DIAGNOSIS — R1031 Right lower quadrant pain: Secondary | ICD-10-CM | POA: Diagnosis not present

## 2019-01-15 DIAGNOSIS — I10 Essential (primary) hypertension: Secondary | ICD-10-CM | POA: Insufficient documentation

## 2019-01-15 DIAGNOSIS — R109 Unspecified abdominal pain: Secondary | ICD-10-CM | POA: Diagnosis present

## 2019-01-15 DIAGNOSIS — J45909 Unspecified asthma, uncomplicated: Secondary | ICD-10-CM | POA: Diagnosis not present

## 2019-01-15 LAB — URINALYSIS, ROUTINE W REFLEX MICROSCOPIC
Bilirubin Urine: NEGATIVE
Glucose, UA: NEGATIVE mg/dL
Hgb urine dipstick: NEGATIVE
Ketones, ur: 15 mg/dL — AB
Leukocytes,Ua: NEGATIVE
Nitrite: NEGATIVE
Protein, ur: NEGATIVE mg/dL
Specific Gravity, Urine: >1.03 — ABNORMAL HIGH (ref 1.005–1.030)
pH: 6 (ref 5.0–8.0)

## 2019-01-15 LAB — COMPREHENSIVE METABOLIC PANEL
ALT: 12 U/L (ref 0–44)
AST: 17 U/L (ref 15–41)
Albumin: 3.8 g/dL (ref 3.5–5.0)
Alkaline Phosphatase: 48 U/L (ref 38–126)
Anion gap: 8 (ref 5–15)
BUN: 17 mg/dL (ref 6–20)
CO2: 21 mmol/L — ABNORMAL LOW (ref 22–32)
Calcium: 8.9 mg/dL (ref 8.9–10.3)
Chloride: 108 mmol/L (ref 98–111)
Creatinine, Ser: 0.94 mg/dL (ref 0.44–1.00)
GFR calc Af Amer: >60 mL/min (ref >60)
GFR calc non Af Amer: >60 mL/min (ref >60)
Glucose, Bld: 83 mg/dL (ref 70–99)
Potassium: 3.8 mmol/L (ref 3.5–5.1)
Sodium: 137 mmol/L (ref 135–145)
Total Bilirubin: 0.6 mg/dL (ref 0.3–1.2)
Total Protein: 7.6 g/dL (ref 6.5–8.1)

## 2019-01-15 LAB — CBC
HCT: 39.7 % (ref 36.0–46.0)
Hemoglobin: 12.6 g/dL (ref 12.0–15.0)
MCH: 28.6 pg (ref 26.0–34.0)
MCHC: 31.7 g/dL (ref 30.0–36.0)
MCV: 90.2 fL (ref 80.0–100.0)
Platelets: 187 10*3/uL (ref 150–400)
RBC: 4.4 MIL/uL (ref 3.87–5.11)
RDW: 14 % (ref 11.5–15.5)
WBC: 6.4 10*3/uL (ref 4.0–10.5)
nRBC: 0 % (ref 0.0–0.2)

## 2019-01-15 LAB — PREGNANCY, URINE: Preg Test, Ur: NEGATIVE

## 2019-01-15 LAB — LIPASE, BLOOD: Lipase: 55 U/L — ABNORMAL HIGH (ref 11–51)

## 2019-01-15 LAB — WET PREP, GENITAL
Sperm: NONE SEEN
Trich, Wet Prep: NONE SEEN
Yeast Wet Prep HPF POC: NONE SEEN

## 2019-01-15 MED ORDER — SODIUM CHLORIDE 0.9 % IV BOLUS
500.0000 mL | Freq: Once | INTRAVENOUS | Status: AC
  Administered 2019-01-15: 500 mL via INTRAVENOUS

## 2019-01-15 MED ORDER — ONDANSETRON HCL 4 MG PO TABS: 4.0000 mg | ORAL_TABLET | Freq: Four times a day (QID) | ORAL | 0 refills | Status: DC

## 2019-01-15 MED ORDER — SODIUM CHLORIDE 0.9% FLUSH
3.0000 mL | Freq: Once | INTRAVENOUS | Status: DC
  Filled 2019-01-15: qty 3

## 2019-01-15 MED ORDER — METRONIDAZOLE 0.75 % VA GEL: 1.0000 | Freq: Every day | VAGINAL | 0 refills | Status: AC

## 2019-01-15 MED ORDER — ONDANSETRON HCL 4 MG/2ML IJ SOLN
4.0000 mg | Freq: Once | INTRAMUSCULAR | Status: AC
  Administered 2019-01-15: 4 mg via INTRAVENOUS
  Filled 2019-01-15: qty 2

## 2019-01-15 MED ORDER — IOHEXOL 300 MG/ML  SOLN
100.0000 mL | Freq: Once | INTRAMUSCULAR | Status: AC | PRN
  Administered 2019-01-15: 100 mL via INTRAVENOUS

## 2019-01-15 NOTE — ED Triage Notes (Signed)
Pt c/o right sided abdominal pain with nausea and difficulty urinating x 2 days. Pt reports that she was working out last night and pain increased.

## 2019-01-15 NOTE — Discharge Instructions (Addendum)
Your work-up was largely reassuring today.  A urine culture will be sent of your urine and you will be called if there needs to be an antibiotic started for a UTI.  You can take Tylenol as prescribed over-the-counter, as needed for pain.  Take Zofran every 6 hours as needed for nausea or vomiting.  Use MetroGel nightly for 5 days as directed.  Please follow-up with your doctor early next week for recheck.  Please return to the emergency department if you develop any new or worsening symptoms peer

## 2019-01-15 NOTE — ED Provider Notes (Signed)
[facility] EMERGENCY DEPARTMENT Provider Note   CSN: [id number] Arrival date & time: 01/15/19  1256     History   Chief Complaint Chief Complaint  Patient presents with  . Abdominal Pain  . Nausea    HPI Monica Cantu is a 33 y.o. female with history of hypertension, asthma, gastric sleeve who presents with a 2-day history of right lower quadrant pain.  She has had associated urinary frequency and urgency.  She has had some suprapubic pressure at times.  She is also had some associated nausea.  She denies any back pain, fever, chest pain, shortness of breath, vomiting, diarrhea, abnormal vaginal bleeding or discharge, concern for STD exposure.  Patient has not taken any medications at home for symptoms.     HPI  Past Medical History:  Diagnosis Date  . Allergy   . Asthma   . Hypertension   . Multiple thyroid nodules    per patient   . PONV (postoperative nausea and vomiting)    with D+ C and tonsillectomy   . Pre-diabetes     Patient Active Problem List   Diagnosis Date Noted  . S/P laparoscopic sleeve gastrectomyNov 2018 03/02/2017  . Low back pain radiating to left leg 09/30/2016  . Multinodular goiter 05/27/2016  . Annual physical exam 02/22/2016  . PCP NOTES >>>>>>>>>>>>>>>>>>>>> 05/31/2015  . Asthma 05/11/2015  . Morbid obesity (HCC) 04/24/2015  . Elevated blood pressure 04/24/2015    Past Surgical History:  Procedure Laterality Date  . DILATION AND CURETTAGE OF UTERUS    . LAPAROSCOPIC GASTRIC SLEEVE RESECTION N/A 03/02/2017   Procedure: LAPAROSCOPIC GASTRIC SLEEVE RESECTION, UPPER ENDO;  Surgeon: Luretha Murphy, MD;  Location: [facility];  Service: General;  Laterality: N/A;  . TONSILLECTOMY AND ADENOIDECTOMY       OB History    Gravida  4   Para  2   Term  2   Preterm  0   AB  2   Living  2     SAB  2   TAB      Ectopic  0   Multiple      Live Births  2            Home Medications    Prior to Admission  medications   Medication Sig Start Date End Date Taking? Authorizing Provider  acetaminophen (TYLENOL) 500 MG tablet Take 2 tablets (1,000 mg total) by mouth every 8 (eight) hours as needed for moderate pain. 11/07/18   Zuri Bradway, Waylan Boga, PA-C  albuterol (VENTOLIN HFA) 108 (90 Base) MCG/ACT inhaler Inhale 1-2 puffs into the lungs every 4 (four) hours as needed for wheezing or shortness of breath. 05/18/18   Wanda Plump, MD  cetirizine (ZYRTEC) 10 MG tablet Take 1 tablet (10 mg total) by mouth at bedtime. 08/11/16   Wanda Plump, MD  FLOVENT [facility] 110 MCG/ACT inhaler  05/18/18   [provider]  ketoconazole (NIZORAL) 2 % cream Apply 1 application topically daily. Area of infection is extensive, please provide 120 g 12/11/17   Wanda Plump, MD  meclizine (ANTIVERT) 25 MG tablet Take 1 tablet (25 mg total) by mouth 3 (three) times daily as needed for dizziness. 02/02/18   Liberty Handy, PA-C  methocarbamol (ROBAXIN) 500 MG tablet Take 1 tablet (500 mg total) by mouth 2 (two) times daily. 11/07/18   Tiwana Chavis, Waylan Boga, PA-C  metroNIDAZOLE (METROGEL VAGINAL) 0.75 % vaginal gel Place 1 Applicatorful vaginally at bedtime  for 5 days. 01/15/19 01/20/19  Alexandr Yaworski, Waylan Boga, PA-C  ondansetron (ZOFRAN) 4 MG tablet Take 1 tablet (4 mg total) by mouth every 6 (six) hours. 01/15/19   Fiorella Hanahan, Waylan Boga, PA-C  pantoprazole (PROTONIX) 40 MG tablet Take 40 mg by mouth daily.    [provider]    Family History Family History  Problem Relation Age of Onset  . Hyperlipidemia Father   . Diabetes Maternal Grandmother   . Breast cancer Maternal Grandmother   . Heart disease Paternal Grandmother   . Hypertension Paternal Grandmother   . Stroke Other   . Colon cancer Neg Hx     Social History Social History   Tobacco Use  . Smoking status: Never Smoker  . Smokeless tobacco: Never Used  Substance Use Topics  . Alcohol use: No    Alcohol/week: 0.0 standard drinks  . Drug use: No     Allergies    Patient has no known allergies.   Review of Systems Review of Systems  Constitutional: Negative for chills and fever.  HENT: Negative for facial swelling and sore throat.   Respiratory: Negative for shortness of breath.   Cardiovascular: Negative for chest pain.  Gastrointestinal: Positive for abdominal pain and nausea. Negative for blood in stool, diarrhea and vomiting.  Genitourinary: Positive for frequency and urgency. Negative for dysuria.  Musculoskeletal: Negative for back pain.  Skin: Negative for rash and wound.  Neurological: Negative for headaches.  Psychiatric/Behavioral: The patient is not nervous/anxious.      Physical Exam Updated Vital Signs BP 127/80 (BP Location: Left Arm)   Pulse 67   Temp 99.1 F (37.3 C) (Oral)   Resp 18   Ht 5\' 5"  (1.651 m)   Wt 136.1 kg   LMP 12/20/2018   SpO2 100%   BMI 49.92 kg/m   Physical Exam Vitals signs and nursing note reviewed.  Constitutional:      General: She is not in acute distress.    Appearance: She is well-developed. She is not diaphoretic.  HENT:     Head: Normocephalic and atraumatic.     Mouth/Throat:     Pharynx: No oropharyngeal exudate.  Eyes:     General: No scleral icterus.       Right eye: No discharge.        Left eye: No discharge.     Conjunctiva/sclera: Conjunctivae normal.     Pupils: Pupils are equal, round, and reactive to light.  Neck:     Musculoskeletal: Normal range of motion and neck supple.     Thyroid: No thyromegaly.  Cardiovascular:     Rate and Rhythm: Normal rate and regular rhythm.     Heart sounds: Normal heart sounds. No murmur. No friction rub. No gallop.   Pulmonary:     Effort: Pulmonary effort is normal. No respiratory distress.     Breath sounds: Normal breath sounds. No stridor. No wheezing or rales.  Abdominal:     General: Bowel sounds are normal. There is no distension.     Palpations: Abdomen is soft.     Tenderness: There is abdominal tenderness in the right  lower quadrant and suprapubic area. There is no right CVA tenderness, left CVA tenderness, guarding or rebound.  Lymphadenopathy:     Cervical: No cervical adenopathy.  Skin:    General: Skin is warm and dry.     Coloration: Skin is not pale.     Findings: No rash.  Neurological:     Mental  Status: She is alert.     Coordination: Coordination normal.      ED Treatments / Results  Labs (all labs ordered are listed, but only abnormal results are displayed) Labs Reviewed  WET PREP, GENITAL - Abnormal; Notable for the following components:      Result Value   Clue Cells Wet Prep HPF POC PRESENT (*)    WBC, Wet Prep HPF POC MANY (*)    All other components within normal limits  LIPASE, BLOOD - Abnormal; Notable for the following components:   Lipase 55 (*)    All other components within normal limits  COMPREHENSIVE METABOLIC PANEL - Abnormal; Notable for the following components:   CO2 21 (*)    All other components within normal limits  URINALYSIS, ROUTINE W REFLEX MICROSCOPIC - Abnormal; Notable for the following components:   Specific Gravity, Urine >1.030 (*)    Ketones, ur 15 (*)    All other components within normal limits  URINE CULTURE  CBC  PREGNANCY, URINE  GC/CHLAMYDIA PROBE AMP ([place]) NOT AT [facility]    EKG None  Radiology Ct Abdomen Pelvis W Contrast  Result Date: 01/15/2019 CLINICAL DATA:  Right lower quadrant abdominal pain. EXAM: CT ABDOMEN AND PELVIS WITH CONTRAST TECHNIQUE: Multidetector CT imaging of the abdomen and pelvis was performed using the standard protocol following bolus administration of intravenous contrast. CONTRAST:  127mL OMNIPAQUE IOHEXOL 300 MG/ML  SOLN COMPARISON:  November 07, 2018. FINDINGS: Lower chest: No acute abnormality. Hepatobiliary: No focal liver abnormality is seen. No gallstones, gallbladder wall thickening, or biliary dilatation. Pancreas: Unremarkable. No pancreatic ductal dilatation or surrounding inflammatory changes.  Spleen: Normal in size without focal abnormality. Adrenals/Urinary Tract: Adrenal glands are unremarkable. Kidneys are normal, without renal calculi, focal lesion, or hydronephrosis. Bladder is unremarkable. Stomach/Bowel: Status post gastric surgery. There is no evidence of bowel obstruction or inflammation. The appendix appears normal. Vascular/Lymphatic: No significant vascular findings are present. No enlarged abdominal or pelvic lymph nodes. Reproductive: Uterus and bilateral adnexa are unremarkable. Other: No abdominal wall hernia or abnormality. No abdominopelvic ascites. Musculoskeletal: No acute or significant osseous findings. IMPRESSION: No acute abnormality seen in the abdomen or pelvis. Electronically Signed   By: Marijo Conception M.D.   On: 01/15/2019 16:33    Procedures Procedures (including critical care time)  Medications Ordered in ED Medications  ondansetron (ZOFRAN) injection 4 mg (4 mg Intravenous Given 01/15/19 1545)  sodium chloride 0.9 % bolus 500 mL (0 mLs Intravenous Stopped 01/15/19 1735)  iohexol (OMNIPAQUE) 300 MG/ML solution 100 mL (100 mLs Intravenous Contrast Given 01/15/19 1604)     Initial Impression / Assessment and Plan / ED Course  I have reviewed the triage vital signs and the nursing notes.  Pertinent labs & imaging results that were available during my care of the patient were reviewed by me and considered in my medical decision making (see chart for details).        Patient presenting with right lower quadrant pain.  Labs are unremarkable except for mildly elevated lipase of 55.  No upper abdominal pain.  CT abdomen pelvis is negative, specifically no appendicitis.  Very low suspicion of ovarian torsion and do not feel pelvic ultrasound indicated emergently.  UA is negative for infection, but 15 ketones shown small fluid bolus given.  Zofran also given.  Patient is comfortable appearing.  Pelvic exam is nontender, but clue cells are found on wet prep.   Will treat with MetroGel.  Will discharge home  with Zofran.  Could be musculoskeletal considering patient is working out at [employer].  Stretching, ice, heat, Tylenol discussed.  Patient cannot take NSAIDs due to her gastric sleeve.  Close follow-up with PCP for recheck in 2 to 3 days.  Return precautions discussed.  Patient understands and agrees with plan.  Patient vital stable throughout ED course and discharged in satisfactory condition.  Final Clinical Impressions(s) / ED Diagnoses   Final diagnoses:  Right lower quadrant abdominal pain    ED Discharge Orders         Ordered    ondansetron (ZOFRAN) 4 MG tablet  Every 6 hours     01/15/19 1808    metroNIDAZOLE (METROGEL VAGINAL) 0.75 % vaginal gel  Daily at bedtime     01/15/19 1808           LawWaylan Boga, Merlin Ege M, PA-C 01/15/19 2244    Virgina Norfolkuratolo, Adam, DO 01/15/19 2346

## 2019-01-17 LAB — GC/CHLAMYDIA PROBE AMP (~~LOC~~) NOT AT ARMC
Chlamydia: NEGATIVE
Neisseria Gonorrhea: NEGATIVE

## 2019-01-17 LAB — URINE CULTURE

## 2019-01-23 ENCOUNTER — Encounter (HOSPITAL_COMMUNITY): Payer: Self-pay

## 2019-01-23 ENCOUNTER — Other Ambulatory Visit: Payer: Self-pay

## 2019-01-23 ENCOUNTER — Ambulatory Visit (HOSPITAL_COMMUNITY)
Admission: EM | Admit: 2019-01-23 | Discharge: 2019-01-23 | Disposition: A | Discharge status: Home / Self Care | Payer: Medicaid Other | Attending: Emergency Medicine | Admitting: Emergency Medicine

## 2019-01-23 DIAGNOSIS — M79605 Pain in left leg: Secondary | ICD-10-CM | POA: Diagnosis not present

## 2019-01-23 DIAGNOSIS — I10 Essential (primary) hypertension: Secondary | ICD-10-CM | POA: Diagnosis not present

## 2019-01-23 DIAGNOSIS — J069 Acute upper respiratory infection, unspecified: Secondary | ICD-10-CM | POA: Diagnosis not present

## 2019-01-23 DIAGNOSIS — H6693 Otitis media, unspecified, bilateral: Secondary | ICD-10-CM

## 2019-01-23 DIAGNOSIS — J3489 Other specified disorders of nose and nasal sinuses: Secondary | ICD-10-CM

## 2019-01-23 DIAGNOSIS — J029 Acute pharyngitis, unspecified: Secondary | ICD-10-CM | POA: Diagnosis not present

## 2019-01-23 DIAGNOSIS — Z20828 Contact with and (suspected) exposure to other viral communicable diseases: Secondary | ICD-10-CM | POA: Insufficient documentation

## 2019-01-23 DIAGNOSIS — G43909 Migraine, unspecified, not intractable, without status migrainosus: Secondary | ICD-10-CM | POA: Diagnosis not present

## 2019-01-23 DIAGNOSIS — R519 Headache, unspecified: Secondary | ICD-10-CM | POA: Insufficient documentation

## 2019-01-23 DIAGNOSIS — Z79899 Other long term (current) drug therapy: Secondary | ICD-10-CM | POA: Insufficient documentation

## 2019-01-23 DIAGNOSIS — J45909 Unspecified asthma, uncomplicated: Secondary | ICD-10-CM | POA: Insufficient documentation

## 2019-01-23 DIAGNOSIS — M545 Low back pain: Secondary | ICD-10-CM | POA: Diagnosis not present

## 2019-01-23 DIAGNOSIS — H6690 Otitis media, unspecified, unspecified ear: Secondary | ICD-10-CM | POA: Diagnosis not present

## 2019-01-23 LAB — POCT RAPID STREP A: Streptococcus, Group A Screen (Direct): NEGATIVE

## 2019-01-23 MED ORDER — AMOXICILLIN-POT CLAVULANATE 875-125 MG PO TABS: 1.0000 | ORAL_TABLET | Freq: Two times a day (BID) | ORAL | 0 refills | Status: AC

## 2019-01-23 MED ORDER — FLUTICASONE PROPIONATE 50 MCG/ACT NA SUSP: 1.0000 | Freq: Every day | NASAL | 2 refills | Status: DC

## 2019-01-23 NOTE — ED Triage Notes (Signed)
Pt present sore throat, left ear pressure and headache. Symptoms started on Friday.

## 2019-01-23 NOTE — Discharge Instructions (Addendum)
I am concerned about infection to your left ear, I think this warrants antibiotics.  Still possible that this is all viral in cause, meaning antibiotics may not be helpful.  Push fluids to ensure adequate hydration and keep secretions thin.  Tylenol and/or ibuprofen as needed for pain or fevers.   Over the counter medications as needed for symptoms. I would also start a daily antihistamine.  Self isolate until covid results are back and negative.  Will notify you of any positive findings. You may monitor your results on your MyChart online as well.

## 2019-01-23 NOTE — ED Provider Notes (Signed)
[facility]    CSN: [id number] Arrival date & time: 01/23/19  1631      History   Chief Complaint Chief Complaint  Patient presents with  . Sore Throat  . Otalgia  . Migraine    HPI Monica ABBETT is a 33 y.o. female.   Monica Cantu presents with complaints of sore throat, even feels it to her neck. Headache. Sinus pressure to face and to left ear. Ear feels throbbing. Has been taking over the counter medications such as sudafed. Symptoms started two days ago. Not improving at all. Temp of 99 two days ago. Some cough, history of asthma however, so does not feel like a new cough. No shortness of breath. Nausea, but since gastric sleeve surgery this is typical for her. No other gi symptoms. 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 History of allergies, asthma, htn.    ROS per HPI, negative if not otherwise mentioned.      Past Medical History:  Diagnosis Date  . Allergy   . Asthma   . Hypertension   . Multiple thyroid nodules    per patient   . PONV (postoperative nausea and vomiting)    with D+ C and tonsillectomy   . Pre-diabetes     Patient Active Problem List   Diagnosis Date Noted  . S/P laparoscopic sleeve gastrectomyNov 2018 03/02/2017  . Low back pain radiating to left leg 09/30/2016  . Multinodular goiter 05/27/2016  . Annual physical exam 02/22/2016  . PCP NOTES >>>>>>>>>>>>>>>>>>>>> 05/31/2015  . Asthma 05/11/2015  . Morbid obesity (HCC) 04/24/2015  . Elevated blood pressure 04/24/2015    Past Surgical History:  Procedure Laterality Date  . DILATION AND CURETTAGE OF UTERUS    . LAPAROSCOPIC GASTRIC SLEEVE RESECTION N/A 03/02/2017   Procedure: LAPAROSCOPIC GASTRIC SLEEVE RESECTION, UPPER ENDO;  Surgeon: Luretha Murphy, MD;  Location: [facility];  Service: General;  Laterality: N/A;  . TONSILLECTOMY AND ADENOIDECTOMY      OB History    Gravida  4   Para  2   Term  2   Preterm  0   AB  2   Living  2      SAB  2   TAB      Ectopic  0   Multiple      Live Births  2            Home Medications    Prior to Admission medications   Medication Sig Start Date End Date Taking? Authorizing Provider  acetaminophen (TYLENOL) 500 MG tablet Take 2 tablets (1,000 mg total) by mouth every 8 (eight) hours as needed for moderate pain. 11/07/18   Law, Waylan Boga, PA-C  albuterol (VENTOLIN HFA) 108 (90 Base) MCG/ACT inhaler Inhale 1-2 puffs into the lungs every 4 (four) hours as needed for wheezing or shortness of breath. 05/18/18   Wanda Plump, MD  amoxicillin-clavulanate (AUGMENTIN) 875-125 MG tablet Take 1 tablet by mouth every 12 (twelve) hours for 5 days. 01/23/19 01/28/19  Georgetta Haber, NP  cetirizine (ZYRTEC) 10 MG tablet Take 1 tablet (10 mg total) by mouth at bedtime. 08/11/16   Wanda Plump, MD  FLOVENT [facility] 110 MCG/ACT inhaler  05/18/18   [provider]  fluticasone (FLONASE) 50 MCG/ACT nasal spray Place 1 spray into both nostrils daily. 01/23/19   Georgetta Haber, NP  ketoconazole (NIZORAL) 2 % cream Apply 1 application topically daily. Area of infection is  extensive, please provide 120 g 12/11/17   Wanda PlumpPaz, Jose E, MD  meclizine (ANTIVERT) 25 MG tablet Take 1 tablet (25 mg total) by mouth 3 (three) times daily as needed for dizziness. 02/02/18   Liberty HandyGibbons, Claudia J, PA-C  methocarbamol (ROBAXIN) 500 MG tablet Take 1 tablet (500 mg total) by mouth 2 (two) times daily. 11/07/18   Law, Waylan BogaAlexandra M, PA-C  ondansetron (ZOFRAN) 4 MG tablet Take 1 tablet (4 mg total) by mouth every 6 (six) hours. 01/15/19   Law, Waylan BogaAlexandra M, PA-C  pantoprazole (PROTONIX) 40 MG tablet Take 40 mg by mouth daily.    [provider]    Family History Family History  Problem Relation Age of Onset  . Hyperlipidemia Father   . Diabetes Maternal Grandmother   . Breast cancer Maternal Grandmother   . Heart disease Paternal Grandmother   . Hypertension Paternal Grandmother   . Stroke Other   . Colon  cancer Neg Hx     Social History Social History   Tobacco Use  . Smoking status: Never Smoker  . Smokeless tobacco: Never Used  Substance Use Topics  . Alcohol use: No    Alcohol/week: 0.0 standard drinks  . Drug use: No     Allergies   Patient has no known allergies.   Review of Systems Review of Systems   Physical Exam Triage Vital Signs ED Triage Vitals  Enc Vitals Group     BP 01/23/19 1643 140/90     Pulse Rate 01/23/19 1643 86     Resp 01/23/19 1643 18     Temp 01/23/19 1643 97.8 F (36.6 C)     Temp Source 01/23/19 1643 Oral     SpO2 01/23/19 1643 100 %     Weight --      Height --      Head Circumference --      Peak Flow --      Pain Score 01/23/19 1646 10     Pain Loc --      Pain Edu? --      Excl. in GC? --    No data found.  Updated Vital Signs BP 140/90 (BP Location: Left Arm)   Pulse 86   Temp 97.8 F (36.6 C) (Oral)   Resp 18   LMP 01/23/2019   SpO2 100%    Physical Exam Constitutional:      General: She is not in acute distress.    Appearance: She is well-developed. She is obese. She is ill-appearing.  HENT:     Right Ear: Ear canal normal. A middle ear effusion is present.     Left Ear: Tympanic membrane is erythematous.     Ears:     Comments: Left tm is partially occluded by cerumen but visible tm is red    Nose: Mucosal edema and rhinorrhea present.     Mouth/Throat:     Tonsils: 0 on the right. 0 on the left.  Cardiovascular:     Rate and Rhythm: Normal rate and regular rhythm.     Heart sounds: Normal heart sounds.  Pulmonary:     Effort: Pulmonary effort is normal.     Breath sounds: Normal breath sounds.  Lymphadenopathy:     Cervical: No cervical adenopathy.  Skin:    General: Skin is warm and dry.  Neurological:     Mental Status: She is alert and oriented to person, place, and time.      UC Treatments / Results  Labs (all labs ordered are listed, but only abnormal results are displayed) Labs Reviewed   NOVEL CORONAVIRUS, NAA (HOSP ORDER, SEND-OUT TO REF LAB; TAT 18-24 HRS)  CULTURE, GROUP A STREP [facility])  POCT RAPID STREP A    EKG   Radiology No results found.  Procedures Procedures (including critical care time)  Medications Ordered in UC Medications - No data to display  Initial Impression / Assessment and Plan / UC Course  I have reviewed the triage vital signs and the nursing notes.  Pertinent labs & imaging results that were available during my care of the patient were reviewed by me and considered in my medical decision making (see chart for details).     Negative rapid strep. Afebrile here today. Ear exam is concerning for AOM, although partially occluded by cerumen. With sinus symptoms and low grade temp initially opted to initiate treatment for this. Over the counter treatments as needed. covid still pending. Return precautions provided. Patient verbalized understanding and agreeable to plan.   Final Clinical Impressions(s) / UC Diagnoses   Final diagnoses:  Upper respiratory tract infection, unspecified type  Acute otitis media, unspecified otitis media type     Discharge Instructions     I am concerned about infection to your left ear, I think this warrants antibiotics.  Still possible that this is all viral in cause, meaning antibiotics may not be helpful.  Push fluids to ensure adequate hydration and keep secretions thin.  Tylenol and/or ibuprofen as needed for pain or fevers.   Over the counter medications as needed for symptoms. I would also start a daily antihistamine.  Self isolate until covid results are back and negative.  Will notify you of any positive findings. You may monitor your results on your MyChart online as well.       ED Prescriptions    Medication Sig Dispense Auth. Provider   amoxicillin-clavulanate (AUGMENTIN) 875-125 MG tablet Take 1 tablet by mouth every 12 (twelve) hours for 5 days. 10 tablet Augusto Gamble B, NP   fluticasone  (FLONASE) 50 MCG/ACT nasal spray Place 1 spray into both nostrils daily. 16 g Zigmund Gottron, NP     PDMP not reviewed this encounter.   Zigmund Gottron, NP 01/23/19 [id number]

## 2019-01-24 LAB — NOVEL CORONAVIRUS, NAA (HOSP ORDER, SEND-OUT TO REF LAB; TAT 18-24 HRS): SARS-CoV-2, NAA: NOT DETECTED

## 2019-01-26 LAB — CULTURE, GROUP A STREP (THRC)

## 2019-03-21 ENCOUNTER — Emergency Department (HOSPITAL_COMMUNITY): Payer: Medicaid Other

## 2019-03-21 ENCOUNTER — Other Ambulatory Visit: Payer: Self-pay

## 2019-03-21 ENCOUNTER — Emergency Department (HOSPITAL_COMMUNITY)
Admission: EM | Admit: 2019-03-21 | Discharge: 2019-03-21 | Disposition: A | Discharge status: Home / Self Care | Payer: Medicaid Other | Attending: Emergency Medicine | Admitting: Emergency Medicine

## 2019-03-21 ENCOUNTER — Encounter (HOSPITAL_COMMUNITY): Payer: Self-pay

## 2019-03-21 DIAGNOSIS — R202 Paresthesia of skin: Secondary | ICD-10-CM | POA: Diagnosis not present

## 2019-03-21 DIAGNOSIS — I10 Essential (primary) hypertension: Secondary | ICD-10-CM | POA: Insufficient documentation

## 2019-03-21 DIAGNOSIS — R0789 Other chest pain: Secondary | ICD-10-CM | POA: Diagnosis not present

## 2019-03-21 DIAGNOSIS — J4521 Mild intermittent asthma with (acute) exacerbation: Secondary | ICD-10-CM | POA: Insufficient documentation

## 2019-03-21 DIAGNOSIS — R0602 Shortness of breath: Secondary | ICD-10-CM | POA: Diagnosis not present

## 2019-03-21 LAB — BASIC METABOLIC PANEL
Anion gap: 9 (ref 5–15)
BUN: 8 mg/dL (ref 6–20)
CO2: 24 mmol/L (ref 22–32)
Calcium: 8.9 mg/dL (ref 8.9–10.3)
Chloride: 109 mmol/L (ref 98–111)
Creatinine, Ser: 0.84 mg/dL (ref 0.44–1.00)
GFR calc Af Amer: >60 mL/min (ref >60)
GFR calc non Af Amer: >60 mL/min (ref >60)
Glucose, Bld: 89 mg/dL (ref 70–99)
Potassium: 3.3 mmol/L — ABNORMAL LOW (ref 3.5–5.1)
Sodium: 142 mmol/L (ref 135–145)

## 2019-03-21 LAB — CBC
HCT: 37.7 % (ref 36.0–46.0)
Hemoglobin: 12.2 g/dL (ref 12.0–15.0)
MCH: 28.8 pg (ref 26.0–34.0)
MCHC: 32.4 g/dL (ref 30.0–36.0)
MCV: 89.1 fL (ref 80.0–100.0)
Platelets: 193 10*3/uL (ref 150–400)
RBC: 4.23 MIL/uL (ref 3.87–5.11)
RDW: 14.2 % (ref 11.5–15.5)
WBC: 6.1 10*3/uL (ref 4.0–10.5)
nRBC: 0 % (ref 0.0–0.2)

## 2019-03-21 LAB — HCG, QUANTITATIVE, PREGNANCY: hCG, Beta Chain, Quant, S: <1 m[IU]/mL (ref <5)

## 2019-03-21 LAB — TROPONIN I (HIGH SENSITIVITY)
Troponin I (High Sensitivity): 3 ng/L (ref <18)
Troponin I (High Sensitivity): 3 ng/L (ref <18)

## 2019-03-21 LAB — BRAIN NATRIURETIC PEPTIDE: B Natriuretic Peptide: 37.7 pg/mL (ref 0.0–100.0)

## 2019-03-21 MED ORDER — ALBUTEROL SULFATE HFA 108 (90 BASE) MCG/ACT IN AERS
4.0000 | INHALATION_SPRAY | Freq: Once | RESPIRATORY_TRACT | Status: AC
  Administered 2019-03-21: 4 via RESPIRATORY_TRACT
  Filled 2019-03-21: qty 6.7

## 2019-03-21 MED ORDER — AEROCHAMBER PLUS FLO-VU MEDIUM MISC
1.0000 | Freq: Once | Status: AC
  Administered 2019-03-21: 1
  Filled 2019-03-21: qty 1

## 2019-03-21 NOTE — ED Provider Notes (Signed)
[place] EMERGENCY DEPARTMENT Provider Note   CSN: [id number] Arrival date & time: 03/21/19  1437     History Chief Complaint  Patient presents with  . Chest Pain  . Shortness of Breath  . Numbness    Monica Cantu is a 33 y.o. female with a past medical history of obesity status post sleeve gastrectomy, hypertension, asthma, allergies, prediabetes, who presents today for evaluation of chest pain and shortness of breath. She reports that since last night she has had intermittent periods of 2 to 3 seconds of sharp chest pain.  She states that she has had about 5 episodes in the past 24 hours.  The chest pain starts rapidly and resolves rapidly and afterwards she feels normal.  She reports that last night when she tried to go to bed she felt a large amount of chest pressure and felt like she was unable to fully breathe.  She normally sleeps on 2-3 pillows however stated she had to sit upright against the wall to sleep.  She notes that for the past 4 days she has had mild headache and postnasal drip which she attributes to allergies.  She reports compliance with her OTC antihistamines and nasal corticosteroid spray.  She also reports that recently she has been having intermittent tingling of her entire right arm.  This is not temporally related to her brief periods of chest pain.  She denies any neck pain or trauma.  She denies any weakness or true numbness.  She also reports that she has had tingling on the outside of her right leg from her buttock radiating down to her foot.  No changes to bowel or bladder function.  She denies any true numbness or weakness.    HPI     Past Medical History:  Diagnosis Date  . Allergy   . Asthma   . Hypertension   . Multiple thyroid nodules    per patient   . PONV (postoperative nausea and vomiting)    with D+ C and tonsillectomy   . Pre-diabetes     Patient Active Problem List   Diagnosis Date Noted  . S/P  laparoscopic sleeve gastrectomyNov 2018 03/02/2017  . Low back pain radiating to left leg 09/30/2016  . Multinodular goiter 05/27/2016  . Annual physical exam 02/22/2016  . PCP NOTES >>>>>>>>>>>>>>>>>>>>> 05/31/2015  . Asthma 05/11/2015  . Morbid obesity ([place]) 04/24/2015  . Elevated blood pressure 04/24/2015    Past Surgical History:  Procedure Laterality Date  . DILATION AND CURETTAGE OF UTERUS    . LAPAROSCOPIC GASTRIC SLEEVE RESECTION N/A 03/02/2017   Procedure: LAPAROSCOPIC GASTRIC SLEEVE RESECTION, UPPER ENDO;  Surgeon: Johnathan Hausen, MD;  Location: [facility];  Service: General;  Laterality: N/A;  . TONSILLECTOMY AND ADENOIDECTOMY       OB History    Gravida  4   Para  2   Term  2   Preterm  0   AB  2   Living  2     SAB  2   TAB      Ectopic  0   Multiple      Live Births  2           Family History  Problem Relation Age of Onset  . Hyperlipidemia Father   . Diabetes Maternal Grandmother   . Breast cancer Maternal Grandmother   . Heart disease Paternal Grandmother   . Hypertension Paternal Grandmother   . Stroke Other   .  Colon cancer Neg Hx     Social History   Tobacco Use  . Smoking status: Never Smoker  . Smokeless tobacco: Never Used  Substance Use Topics  . Alcohol use: No    Alcohol/week: 0.0 standard drinks  . Drug use: No    Home Medications Prior to Admission medications   Medication Sig Start Date End Date Taking? Authorizing Provider  acetaminophen (TYLENOL) 500 MG tablet Take 2 tablets (1,000 mg total) by mouth every 8 (eight) hours as needed for moderate pain. 11/07/18   Law, Waylan BogaAlexandra M, PA-C  albuterol (VENTOLIN HFA) 108 (90 Base) MCG/ACT inhaler Inhale 1-2 puffs into the lungs every 4 (four) hours as needed for wheezing or shortness of breath. 05/18/18   Wanda PlumpPaz, Jose E, MD  cetirizine (ZYRTEC) 10 MG tablet Take 1 tablet (10 mg total) by mouth at bedtime. 08/11/16   Wanda PlumpPaz, Jose E, MD  FLOVENT [facility] MCG/ACT inhaler  05/18/18    [provider]  fluticasone (FLONASE) 50 MCG/ACT nasal spray Place 1 spray into both nostrils daily. 01/23/19   Georgetta HaberBurky, Natalie B, NP  ketoconazole (NIZORAL) 2 % cream Apply 1 application topically daily. Area of infection is extensive, please provide 120 g 12/11/17   Wanda PlumpPaz, Jose E, MD  meclizine (ANTIVERT) 25 MG tablet Take 1 tablet (25 mg total) by mouth 3 (three) times daily as needed for dizziness. 02/02/18   Liberty HandyGibbons, Claudia J, PA-C  methocarbamol (ROBAXIN) 500 MG tablet Take 1 tablet (500 mg total) by mouth 2 (two) times daily. 11/07/18   Law, Waylan BogaAlexandra M, PA-C  ondansetron (ZOFRAN) 4 MG tablet Take 1 tablet (4 mg total) by mouth every 6 (six) hours. 01/15/19   Law, Waylan BogaAlexandra M, PA-C  pantoprazole (PROTONIX) 40 MG tablet Take 40 mg by mouth daily.    [provider]    Allergies    Patient has no known allergies.  Review of Systems   Review of Systems  Constitutional: Negative for chills and fever.  HENT: Positive for postnasal drip. Negative for congestion.   Respiratory: Positive for chest tightness and shortness of breath. Negative for wheezing.   Cardiovascular: Positive for chest pain. Negative for palpitations and leg swelling.  Gastrointestinal: Negative for abdominal pain, diarrhea, nausea and vomiting.  Musculoskeletal: Negative for back pain and neck pain.  Neurological: Positive for headaches.       Tingling in right arm intermittently and in left lateral leg  All other systems reviewed and are negative.   Physical Exam Updated Vital Signs BP 140/69   Pulse 84   Temp 98.8 F (37.1 C) (Oral)   Resp 16   Ht 5\' 5"  (1.651 m)   Wt 134.7 kg   LMP 03/15/2019   SpO2 100%   BMI 49.42 kg/m   Physical Exam Vitals and nursing note reviewed.  Constitutional:      General: She is not in acute distress.    Appearance: She is obese.  HENT:     Head: Normocephalic and atraumatic.  Eyes:     Conjunctiva/sclera: Conjunctivae normal.  Cardiovascular:     Rate  and Rhythm: Normal rate and regular rhythm.     Pulses:          Radial pulses are 2+ on the right side and 2+ on the left side.       Dorsalis pedis pulses are 2+ on the right side and 2+ on the left side.     Heart sounds: Normal heart sounds. No murmur.  Pulmonary:     Effort: Pulmonary effort is normal. No accessory muscle usage or respiratory distress.     Breath sounds: Normal breath sounds. No decreased breath sounds or wheezing.  Chest:     Chest wall: Tenderness (Lower sternocostal joints bilaterally. ) present.  Abdominal:     Palpations: Abdomen is soft.     Tenderness: There is no abdominal tenderness.  Musculoskeletal:     Cervical back: Normal range of motion and neck supple.     Right lower leg: No tenderness. No edema.     Left lower leg: No tenderness. No edema.  Skin:    General: Skin is warm and dry.  Neurological:     General: No focal deficit present.     Mental Status: She is alert.     Motor: No weakness.  Psychiatric:        Mood and Affect: Mood normal.        Behavior: Behavior normal.     ED Results / Procedures / Treatments   Labs (all labs ordered are listed, but only abnormal results are displayed) Labs Reviewed  BASIC METABOLIC PANEL - Abnormal; Notable for the following components:      Result Value   Potassium 3.3 (*)    All other components within normal limits  CBC  BRAIN NATRIURETIC PEPTIDE  HCG, QUANTITATIVE, PREGNANCY  I-STAT BETA HCG BLOOD, ED ([facility], [facility], [facility] ONLY)  TROPONIN I (HIGH SENSITIVITY)  TROPONIN I (HIGH SENSITIVITY)  TROPONIN I (HIGH SENSITIVITY)  TROPONIN I (HIGH SENSITIVITY)    EKG EKG Interpretation  Date/Time:  Monday March 21 2019 14:49:05 EST Ventricular Rate:  84 PR Interval:  156 QRS Duration: 86 QT Interval:  342 QTC Calculation: 404 R Axis:   57 Text Interpretation: Normal sinus rhythm Nonspecific T wave abnormality Abnormal ECG Confirmed by Marianna Fuss ([id number]) on 03/21/2019 5:53:19  PM   Radiology DG Chest 2 View  Result Date: 03/21/2019 CLINICAL DATA:  Chest pain, shortness of breath EXAM: CHEST - 2 VIEW COMPARISON:  Radiograph November 05, 2016 FINDINGS: No consolidation, features of edema, pneumothorax, or effusion. Pulmonary vascularity is normally distributed. The cardiomediastinal contours are unremarkable. No acute osseous or soft tissue abnormality. IMPRESSION: No acute cardiopulmonary abnormality. Electronically Signed   By: Kreg Shropshire M.D.   On: 03/21/2019 15:18    Procedures Procedures (including critical care time)  Medications Ordered in ED Medications  albuterol (VENTOLIN HFA) 108 (90 Base) MCG/ACT inhaler 4 puff (4 puffs Inhalation Given 03/21/19 1837)  AeroChamber Plus Flo-Vu Medium MISC 1 each (1 each Other Given 03/21/19 1837)    ED Course  I have reviewed the triage vital signs and the nursing notes.  Pertinent labs & imaging results that were available during my care of the patient were reviewed by me and considered in my medical decision making (see chart for details).    MDM Rules/Calculators/A&P                     Estanislado Pandy presents today for evaluation of chest pain and shortness of breath.  She has had 5 episodes of chest pain that last under 5 seconds before going away along with feelings of chest pressure last night and needing to sleep sitting up.  She also reports paresthesias in her right arm and right leg which do not temporally line up with the chest pains.  Troponin x2 is normal.  Chest x-ray without evidence of consolidation or other abnormalities.  She  is PERC negative.  CBC and BMP only significant for very mild hypokalemia at 3.3.  BNP is not elevated.  Pregnancy test is negative.  Patient was given 4 puffs of albuterol with plans for coronavirus testing and repeat evaluation.  Before I could reevaluate patient she was found to have eloped.  She did not give notice of her intent to do this therefore I was unable to  discuss the risks of this decision with her, and unable to reevaluate her after attempted treatment.  Final Clinical Impression(s) / ED Diagnoses Final diagnoses:  Atypical chest pain  Paresthesia  Mild intermittent asthma with acute exacerbation    Rx / DC Orders ED Discharge Orders    None       Norman Clay 03/21/19 2138    Milagros Loll, MD 03/22/19 [phone]

## 2019-03-21 NOTE — ED Triage Notes (Signed)
Pt reports chest pain that started last night (heaviness and sharp feeling), pt also reports SOB that started last night as well. Pt used inhaler at home with little relief. Pt also reports right arm and leg numbness last night. Pt a.o, no neuro deficits noted. Resp e.u

## 2019-03-21 NOTE — ED Notes (Signed)
Notified by ED tech that pt left without being discharged.

## 2019-03-21 NOTE — ED Notes (Signed)
Pt. Walked away from bed, and left the ED.

## 2019-04-11 ENCOUNTER — Encounter (HOSPITAL_COMMUNITY): Payer: Self-pay

## 2019-04-11 ENCOUNTER — Other Ambulatory Visit: Payer: Self-pay

## 2019-04-11 ENCOUNTER — Ambulatory Visit (HOSPITAL_COMMUNITY)
Admission: EM | Admit: 2019-04-11 | Discharge: 2019-04-11 | Disposition: A | Discharge status: Home / Self Care | Payer: Medicaid Other | Attending: Family Medicine | Admitting: Family Medicine

## 2019-04-11 DIAGNOSIS — Z20822 Contact with and (suspected) exposure to covid-19: Secondary | ICD-10-CM | POA: Diagnosis not present

## 2019-04-11 DIAGNOSIS — R197 Diarrhea, unspecified: Secondary | ICD-10-CM | POA: Insufficient documentation

## 2019-04-11 DIAGNOSIS — R519 Headache, unspecified: Secondary | ICD-10-CM

## 2019-04-11 DIAGNOSIS — R110 Nausea: Secondary | ICD-10-CM | POA: Diagnosis present

## 2019-04-11 DIAGNOSIS — Z3202 Encounter for pregnancy test, result negative: Secondary | ICD-10-CM

## 2019-04-11 DIAGNOSIS — I10 Essential (primary) hypertension: Secondary | ICD-10-CM | POA: Diagnosis not present

## 2019-04-11 DIAGNOSIS — J45909 Unspecified asthma, uncomplicated: Secondary | ICD-10-CM | POA: Diagnosis not present

## 2019-04-11 DIAGNOSIS — Z79899 Other long term (current) drug therapy: Secondary | ICD-10-CM | POA: Diagnosis not present

## 2019-04-11 LAB — POCT URINALYSIS DIP (DEVICE)
Bilirubin Urine: NEGATIVE
Glucose, UA: NEGATIVE mg/dL
Ketones, ur: NEGATIVE mg/dL
Leukocytes,Ua: NEGATIVE
Nitrite: NEGATIVE
Protein, ur: NEGATIVE mg/dL
Specific Gravity, Urine: >=1.03 (ref 1.005–1.030)
Urobilinogen, UA: 0.2 mg/dL (ref 0.0–1.0)
pH: 5.5 (ref 5.0–8.0)

## 2019-04-11 MED ORDER — ONDANSETRON 4 MG PO TBDP
ORAL_TABLET | ORAL | Status: AC
  Filled 2019-04-11: qty 1

## 2019-04-11 MED ORDER — ONDANSETRON HCL 4 MG PO TABS: 4.0000 mg | ORAL_TABLET | Freq: Three times a day (TID) | ORAL | 0 refills | Status: DC | PRN

## 2019-04-11 MED ORDER — KETOROLAC TROMETHAMINE 60 MG/2ML IM SOLN
INTRAMUSCULAR | Status: AC
  Filled 2019-04-11: qty 2

## 2019-04-11 MED ORDER — ONDANSETRON 4 MG PO TBDP
4.0000 mg | ORAL_TABLET | Freq: Once | ORAL | Status: AC
  Administered 2019-04-11: 4 mg via ORAL

## 2019-04-11 MED ORDER — KETOROLAC TROMETHAMINE 60 MG/2ML IM SOLN
60.0000 mg | Freq: Once | INTRAMUSCULAR | Status: AC
  Administered 2019-04-11: 60 mg via INTRAMUSCULAR

## 2019-04-11 NOTE — ED Provider Notes (Signed)
[facility]    CSN: [id number] Arrival date & time: 04/11/19  1545      History   Chief Complaint Chief Complaint  Patient presents with  . Nausea    HPI Monica Cantu is a 34 y.o. female.   Monica Cantu presents with complaints of nausea and headache. Nausea is constant. No vomiting. These started a few days ago. Some chest heaviness which started yesterday, feels similar to previous issues with her asthma. Has been using her albuterol and her maintenance inhalers, which do help. Albuterol last this morning. No cough. No shortness of breath . Feels "raspy." took tylenol at 3p today which did seem to help. Has had some loose stool. No urinary symptoms. Didn't eat today except for crackers. No dizziness. No vision change, light or sound sensitivity. Some nasal drainage. Temp of 99.9 two nights ago and felt sweaty yesterday evening. Nasal drainage, states this is typical with her allergies. Uses daily zyrtec and flonase. No known ill contacts. 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 History  Of asthma, htn, obesity, gastric sleeve surgery. Per chart review was seen in ER 12/14 with chest pain with reassuring findings and attributed to asthma, seen here last 10/18 also with headache, nausea, as well as well ear infection.    ROS per HPI, negative if not otherwise mentioned.      Past Medical History:  Diagnosis Date  . Allergy   . Asthma   . Hypertension   . Multiple thyroid nodules    per patient   . PONV (postoperative nausea and vomiting)    with D+ C and tonsillectomy   . Pre-diabetes     Patient Active Problem List   Diagnosis Date Noted  . S/P laparoscopic sleeve gastrectomyNov 2018 03/02/2017  . Low back pain radiating to left leg 09/30/2016  . Multinodular goiter 05/27/2016  . Annual physical exam 02/22/2016  . PCP NOTES >>>>>>>>>>>>>>>>>>>>> 05/31/2015  . Asthma 05/11/2015  . Morbid obesity ([place])  04/24/2015  . Elevated blood pressure 04/24/2015    Past Surgical History:  Procedure Laterality Date  . DILATION AND CURETTAGE OF UTERUS    . LAPAROSCOPIC GASTRIC SLEEVE RESECTION N/A 03/02/2017   Procedure: LAPAROSCOPIC GASTRIC SLEEVE RESECTION, UPPER ENDO;  Surgeon: Johnathan Hausen, MD;  Location: [facility];  Service: General;  Laterality: N/A;  . TONSILLECTOMY AND ADENOIDECTOMY      OB History    Gravida  4   Para  2   Term  2   Preterm  0   AB  2   Living  2     SAB  2   TAB      Ectopic  0   Multiple      Live Births  2            Home Medications    Prior to Admission medications   Medication Sig Start Date End Date Taking? Authorizing Provider  acetaminophen (TYLENOL) 500 MG tablet Take 2 tablets (1,000 mg total) by mouth every 8 (eight) hours as needed for moderate pain. 11/07/18   Law, Bea Graff, PA-C  albuterol (VENTOLIN HFA) 108 (90 Base) MCG/ACT inhaler Inhale 1-2 puffs into the lungs every 4 (four) hours as needed for wheezing or shortness of breath. 05/18/18   Colon Branch, MD  cetirizine (ZYRTEC) 10 MG tablet Take 1 tablet (10 mg total) by mouth at bedtime. 08/11/16   Colon Branch,  MD  FLOVENT HFA 110 MCG/ACT inhaler  05/18/18   [provider]  fluticasone (FLONASE) 50 MCG/ACT nasal spray Place 1 spray into both nostrils daily. 01/23/19   Georgetta Haber, NP  ketoconazole (NIZORAL) 2 % cream Apply 1 application topically daily. Area of infection is extensive, please provide 120 g 12/11/17   Wanda Plump, MD  meclizine (ANTIVERT) 25 MG tablet Take 1 tablet (25 mg total) by mouth 3 (three) times daily as needed for dizziness. 02/02/18   Liberty Handy, PA-C  methocarbamol (ROBAXIN) 500 MG tablet Take 1 tablet (500 mg total) by mouth 2 (two) times daily. 11/07/18   Law, Waylan Boga, PA-C  ondansetron (ZOFRAN) 4 MG tablet Take 1 tablet (4 mg total) by mouth every 8 (eight) hours as needed for nausea or vomiting. 04/11/19   Georgetta Haber, NP   pantoprazole (PROTONIX) 40 MG tablet Take 40 mg by mouth daily.    [provider]    Family History Family History  Problem Relation Age of Onset  . Hyperlipidemia Father   . Diabetes Maternal Grandmother   . Breast cancer Maternal Grandmother   . Heart disease Paternal Grandmother   . Hypertension Paternal Grandmother   . Stroke Other   . Colon cancer Neg Hx     Social History Social History   Tobacco Use  . Smoking status: Never Smoker  . Smokeless tobacco: Never Used  Substance Use Topics  . Alcohol use: No    Alcohol/week: 0.0 standard drinks  . Drug use: No     Allergies   Patient has no known allergies.   Review of Systems Review of Systems   Physical Exam Triage Vital Signs ED Triage Vitals  Enc Vitals Group     BP 04/11/19 1645 133/88     Pulse Rate 04/11/19 1645 75     Resp 04/11/19 1645 16     Temp 04/11/19 1645 98.8 F (37.1 C)     Temp Source 04/11/19 1645 Oral     SpO2 04/11/19 1645 100 %     Weight --      Height --      Head Circumference --      Peak Flow --      Pain Score 04/11/19 1643 8     Pain Loc --      Pain Edu? --      Excl. in GC? --    No data found.  Updated Vital Signs BP (S) 133/88 (BP Location: Right Wrist)   Pulse 75   Temp 98.8 F (37.1 C) (Oral)   Resp 16   LMP 03/15/2019   SpO2 100%    Physical Exam Constitutional:      General: She is not in acute distress.    Appearance: She is well-developed.  Cardiovascular:     Rate and Rhythm: Normal rate.  Pulmonary:     Effort: Pulmonary effort is normal.  Skin:    General: Skin is warm and dry.  Neurological:     Mental Status: She is alert and oriented to person, place, and time.      UC Treatments / Results  Labs (all labs ordered are listed, but only abnormal results are displayed) Labs Reviewed  POCT URINALYSIS DIP (DEVICE) - Abnormal; Notable for the following components:      Result Value   Hgb urine dipstick TRACE (*)    All other  components within normal limits  NOVEL CORONAVIRUS, NAA (HOSP ORDER,  SEND-OUT TO REF LAB; TAT 18-24 HRS)  POC URINE PREG, ED  POCT PREGNANCY, URINE    EKG   Radiology No results found.  Procedures Procedures (including critical care time)  Medications Ordered in UC Medications  ondansetron (ZOFRAN-ODT) disintegrating tablet 4 mg (4 mg Oral Given 04/11/19 1744)  ketorolac (TORADOL) injection 60 mg (60 mg Intramuscular Given 04/11/19 1744)    Initial Impression / Assessment and Plan / UC Course  I have reviewed the triage vital signs and the nursing notes.  Pertinent labs & imaging results that were available during my care of the patient were reviewed by me and considered in my medical decision making (see chart for details).     Non toxic. Benign physical exam. Complaints primarily acute on chronic in nature in discussion and in chart review. Vitals normal here tonight. No vomiting. No fevers. Inhaler helps with asthma/ breathing. covid testing collected and pending as well. Isolation discussed.  toradol and zofran provided for symptom management. Return precautions provided. Patient verbalized understanding and agreeable to plan.   Final Clinical Impressions(s) / UC Diagnoses   Final diagnoses:  Nausea  Acute nonintractable headache, unspecified headache type     Discharge Instructions     Your urine does demonstrate some dehydration, try to increase your fluid intake.  Small frequent sips of fluids- Pedialyte, Gatorade, water, broth- to maintain hydration.   Zofran every 8 hours as needed for nausea or vomiting.   Tylenol as needed for headache.  Rest.  Use of your inhaler as needed.  Self isolate until covid results are back and negative.  Will notify you by phone of any positive findings. Your negative results will be sent through your MyChart.     Please return for any worsening of symptoms.     ED Prescriptions    Medication Sig Dispense Auth. Provider    ondansetron (ZOFRAN) 4 MG tablet Take 1 tablet (4 mg total) by mouth every 8 (eight) hours as needed for nausea or vomiting. 12 tablet Georgetta Haber, NP     PDMP not reviewed this encounter.   Georgetta Haber, NP 04/11/19 1815

## 2019-04-11 NOTE — ED Triage Notes (Signed)
Patient presents to Urgent Care with complaints of nausea since 3 days ago. Patient reports she has intermittent headaches as well.

## 2019-04-11 NOTE — Discharge Instructions (Signed)
Your urine does demonstrate some dehydration, try to increase your fluid intake.  Small frequent sips of fluids- Pedialyte, Gatorade, water, broth- to maintain hydration.   Zofran every 8 hours as needed for nausea or vomiting.   Tylenol as needed for headache.  Rest.  Use of your inhaler as needed.  Self isolate until covid results are back and negative.  Will notify you by phone of any positive findings. Your negative results will be sent through your MyChart.     Please return for any worsening of symptoms.

## 2019-04-13 LAB — NOVEL CORONAVIRUS, NAA (HOSP ORDER, SEND-OUT TO REF LAB; TAT 18-24 HRS): SARS-CoV-2, NAA: NOT DETECTED

## 2019-05-11 DIAGNOSIS — Z09 Encounter for follow-up examination after completed treatment for conditions other than malignant neoplasm: Secondary | ICD-10-CM | POA: Diagnosis not present

## 2019-05-11 DIAGNOSIS — K219 Gastro-esophageal reflux disease without esophagitis: Secondary | ICD-10-CM | POA: Diagnosis not present

## 2019-06-08 DIAGNOSIS — Z23 Encounter for immunization: Secondary | ICD-10-CM | POA: Diagnosis not present

## 2019-06-08 DIAGNOSIS — Z111 Encounter for screening for respiratory tuberculosis: Secondary | ICD-10-CM | POA: Diagnosis not present

## 2019-06-10 DIAGNOSIS — Z111 Encounter for screening for respiratory tuberculosis: Secondary | ICD-10-CM | POA: Diagnosis not present

## 2019-06-14 ENCOUNTER — Encounter: Payer: Self-pay | Admitting: Internal Medicine

## 2019-06-14 ENCOUNTER — Ambulatory Visit: Payer: BC Managed Care – PPO

## 2019-06-14 ENCOUNTER — Ambulatory Visit (INDEPENDENT_AMBULATORY_CARE_PROVIDER_SITE_OTHER): Payer: BC Managed Care – PPO | Admitting: Internal Medicine

## 2019-06-14 ENCOUNTER — Other Ambulatory Visit: Payer: Self-pay

## 2019-06-14 VITALS — Ht 65.0 in | Wt 304.0 lb

## 2019-06-14 DIAGNOSIS — E041 Nontoxic single thyroid nodule: Secondary | ICD-10-CM

## 2019-06-14 NOTE — Progress Notes (Signed)
Subjective:    Patient ID: Monica Cantu, female    DOB: [birth year], 34 y.o.   MRN: [id number]  DOS:  06/14/2019 Type of visit - description: Virtual Visit via Video Note  I connected with the above patient  by a video enabled telemedicine application and verified that I am speaking with the correct person using two identifiers.   THIS ENCOUNTER IS A VIRTUAL VISIT DUE TO COVID-19 - PATIENT WAS NOT SEEN IN THE OFFICE. PATIENT HAS CONSENTED TO VIRTUAL VISIT / TELEMEDICINE VISIT   Location of patient: home  Location of provider: office  I discussed the limitations of evaluation and management by telemedicine and the availability of in person appointments. The patient expressed understanding and agreed to proceed.   Acute Last visit 2019 Her main concern is the nodules on her thyroid gland, they have increased in size and are somewhat tender to touch.  She is also slightly hoarse.   Review of Systems Denies fever chills No weight loss Has episodic nausea. No diarrhea, no cough  Past Medical History:  Diagnosis Date  . Allergy   . Asthma   . Hypertension   . Multiple thyroid nodules    per patient   . PONV (postoperative nausea and vomiting)    with D+ C and tonsillectomy   . Pre-diabetes     Past Surgical History:  Procedure Laterality Date  . DILATION AND CURETTAGE OF UTERUS    . LAPAROSCOPIC GASTRIC SLEEVE RESECTION N/A 03/02/2017   Procedure: LAPAROSCOPIC GASTRIC SLEEVE RESECTION, UPPER ENDO;  Surgeon: Johnathan Hausen, MD;  Location: [facility];  Service: General;  Laterality: N/A;  . TONSILLECTOMY AND ADENOIDECTOMY      Allergies as of 06/14/2019      Reactions   Amoxicillin-pot Clavulanate Diarrhea, Other (See Comments), Nausea Only      Medication List       Accurate as of June 14, 2019 10:58 AM. If you have any questions, ask your nurse or doctor.        acetaminophen 500 MG tablet Commonly known as: TYLENOL Take 2 tablets (1,000 mg total) by mouth every  8 (eight) hours as needed for moderate pain.   albuterol 108 (90 Base) MCG/ACT inhaler Commonly known as: Ventolin HFA Inhale 1-2 puffs into the lungs every 4 (four) hours as needed for wheezing or shortness of breath.   cetirizine 10 MG tablet Commonly known as: ZYRTEC Take 1 tablet (10 mg total) by mouth at bedtime.   Flovent HFA 110 MCG/ACT inhaler Generic drug: fluticasone   fluticasone 50 MCG/ACT nasal spray Commonly known as: FLONASE Place 1 spray into both nostrils daily.   ketoconazole 2 % cream Commonly known as: NIZORAL Apply 1 application topically daily. Area of infection is extensive, please provide 120 g   meclizine 25 MG tablet Commonly known as: ANTIVERT Take 1 tablet (25 mg total) by mouth 3 (three) times daily as needed for dizziness.   methocarbamol 500 MG tablet Commonly known as: ROBAXIN Take 1 tablet (500 mg total) by mouth 2 (two) times daily.   ondansetron 4 MG tablet Commonly known as: ZOFRAN Take 1 tablet (4 mg total) by mouth every 8 (eight) hours as needed for nausea or vomiting.   pantoprazole 40 MG tablet Commonly known as: PROTONIX Take 40 mg by mouth daily.   phentermine 37.5 MG capsule Take 1 capsule by mouth daily.             Objective:   Physical Exam Ht 5'  5" (1.651 m)   Wt (!) 304 lb (137.9 kg)   LMP 06/06/2019 (Exact Date)   BMI 50.59 kg/m  This is a virtual video visit, she is alert oriented x3, in no distress, voice is normal, I have a limited view of her neck but she was able to actually hold between 2 fingers a thyroid nodule.    Assessment      Assessment Prediabetes: A1c 6.0 05-2015 Elevated BP w/o dx of HTN Thyromegaly-nodules (see [place] 04-2015), bx rx per endo : (-) 06-2016 Asthma; Allergies Morbid obesity- bariatric surgery 02-2017  H/o fibroids   PLAN:   Thyroid nodules: Last visit with endocrinology 2018.  She reports that recently her thyroid gland has increased in size, is slightly tender and she is  hoarse.  Needs further evaluation. Plan: Arrange a thyroid ultrasound and refer back to endocrinology. She needs a checkup, encouraged to set up an appointment at her convenience.   I discussed the assessment and treatment plan with the patient. The patient was provided an opportunity to ask questions and all were answered. The patient agreed with the plan and demonstrated an understanding of the instructions.   The patient was advised to call back or seek an in-person evaluation if the symptoms worsen or if the condition fails to improve as anticipated.

## 2019-06-14 NOTE — Progress Notes (Signed)
Pre visit review using our clinic review tool, if applicable. No additional management support is needed unless otherwise documented below in the visit note. 

## 2019-06-15 NOTE — Assessment & Plan Note (Signed)
Thyroid nodules: Last visit with endocrinology 2018.  She reports that recently her thyroid gland has increased in size, is slightly tender and she is hoarse.  Needs further evaluation. Plan: Arrange a thyroid ultrasound and refer back to endocrinology. She needs a checkup, encouraged to set up an appointment at her convenience.

## 2019-06-16 ENCOUNTER — Ambulatory Visit (HOSPITAL_BASED_OUTPATIENT_CLINIC_OR_DEPARTMENT_OTHER)
Admission: RE | Admit: 2019-06-16 | Discharge: 2019-06-16 | Disposition: A | Discharge status: Home / Self Care | Payer: BC Managed Care – PPO | Source: Ambulatory Visit | Attending: Internal Medicine | Admitting: Internal Medicine

## 2019-06-16 ENCOUNTER — Other Ambulatory Visit: Payer: Self-pay

## 2019-06-16 DIAGNOSIS — E041 Nontoxic single thyroid nodule: Secondary | ICD-10-CM | POA: Insufficient documentation

## 2019-06-16 DIAGNOSIS — E042 Nontoxic multinodular goiter: Secondary | ICD-10-CM | POA: Diagnosis not present

## 2019-06-29 DIAGNOSIS — R350 Frequency of micturition: Secondary | ICD-10-CM | POA: Diagnosis not present

## 2019-06-29 DIAGNOSIS — Z803 Family history of malignant neoplasm of breast: Secondary | ICD-10-CM | POA: Diagnosis not present

## 2019-06-29 DIAGNOSIS — Z8042 Family history of malignant neoplasm of prostate: Secondary | ICD-10-CM | POA: Diagnosis not present

## 2019-06-29 DIAGNOSIS — Z01419 Encounter for gynecological examination (general) (routine) without abnormal findings: Secondary | ICD-10-CM | POA: Diagnosis not present

## 2019-06-29 DIAGNOSIS — Z124 Encounter for screening for malignant neoplasm of cervix: Secondary | ICD-10-CM | POA: Diagnosis not present

## 2019-06-29 DIAGNOSIS — Z8041 Family history of malignant neoplasm of ovary: Secondary | ICD-10-CM | POA: Diagnosis not present

## 2019-06-29 DIAGNOSIS — Z6841 Body Mass Index (BMI) 40.0 and over, adult: Secondary | ICD-10-CM | POA: Diagnosis not present

## 2019-06-29 LAB — HM PAP SMEAR

## 2019-06-30 DIAGNOSIS — Z20828 Contact with and (suspected) exposure to other viral communicable diseases: Secondary | ICD-10-CM | POA: Diagnosis not present

## 2019-06-30 DIAGNOSIS — Z03818 Encounter for observation for suspected exposure to other biological agents ruled out: Secondary | ICD-10-CM | POA: Diagnosis not present

## 2019-06-30 LAB — NOVEL CORONAVIRUS, NAA: SARS-CoV-2, NAA: NOT DETECTED

## 2019-07-01 ENCOUNTER — Other Ambulatory Visit: Payer: Self-pay

## 2019-07-05 ENCOUNTER — Other Ambulatory Visit: Payer: Self-pay

## 2019-07-05 ENCOUNTER — Encounter: Payer: Self-pay | Admitting: Internal Medicine

## 2019-07-05 ENCOUNTER — Ambulatory Visit (INDEPENDENT_AMBULATORY_CARE_PROVIDER_SITE_OTHER): Payer: BC Managed Care – PPO | Admitting: Internal Medicine

## 2019-07-05 VITALS — BP 120/82 | HR 88 | Ht 65.0 in | Wt 316.0 lb

## 2019-07-05 DIAGNOSIS — E042 Nontoxic multinodular goiter: Secondary | ICD-10-CM | POA: Diagnosis not present

## 2019-07-05 LAB — T3, FREE: T3, Free: 3 pg/mL (ref 2.3–4.2)

## 2019-07-05 LAB — T4, FREE: Free T4: 0.84 ng/dL (ref 0.60–1.60)

## 2019-07-05 LAB — TSH: TSH: 0.53 u[IU]/mL (ref 0.35–4.50)

## 2019-07-05 NOTE — Progress Notes (Signed)
Patient ID: Monica Cantu, female   DOB: [birth year], 34 y.o.   MRN: [id number]   This visit occurred during the SARS-CoV-2 public health emergency.  Safety protocols were in place, including screening questions prior to the visit, additional usage of staff PPE, and extensive cleaning of exam room while observing appropriate contact time as indicated for disinfecting solutions.   HPI  Monica Cantu is a 34 y.o.-year-old female, referred by her PCP, Dr. Drue Novel, for evaluation for multiple thyroid nodules.  I previously saw the patient for the same problems more than 3 years ago but she was lost for follow-up afterwards.  When I last saw her, she had mild neck compression symptoms, not bothersome.  However, she describes that 6 months ago, she started to have neck pressure when she was laying down, also worse hoarseness, coughing - unrelieved by her inhalers.  She is having a hard time finishing a song and is taking 2 to 3 days to recover from singing in church on Sundays.  I reviewed  yes the patient's thyroid ultrasound and biopsy reports: Thyroid U/S (04/12/2016): - several nodules, of which: 1. Isthmic nodule: 4 x 2.6 x 2.7 cm, isoechoic 2. L lobe nodule: 2.1 x 1.8 x 2.0 cm, hyperechoic 3. L lobe nodule: 0.9 x 1.3 x 1.6 cm, hyperechoic 4. L lobe nodule: 4.6 x 3.4 x 4.7 cm, hyperechoic R lobe with only small cysts  Biopsy of the dominant nodules (06/18/2016): Adequacy Reason Satisfactory For Evaluation. Diagnosis THYROID, FINE NEEDLE ASPIRATION LEFT LOBE, LEFT LOWER POLE (SPECIMEN 1 OF 2 COLLECTED 06/18/2016) CONSISTENT WITH BENIGN FOLLICULAR NODULE (BETHESDA CATEGORY II). Jimmy Picket MD Pathologist, Electronic Signature (Case signed 06/19/2016) Specimen Clinical Information Nodule 3 Left Inferior 4.6 x 3.4 x 4.7 cm, Hyperechoic, ACR TI-RADS total points:3, Mildly suspicious nodule Source Thyroid, Fine Needle Aspiration, Lt Lobe LLP, (Specimen 1 of 2, collected on 06/18/16  )  Adequacy Reason Satisfactory For Evaluation. Diagnosis THYROID, FINE NEEDLE ASPIRATION ISTHMUS (SPECIMEN 2 OF 2 COLLECTED 06/18/2016) CONSISTENT WITH BENIGN FOLLICULAR NODULE (BETHESDA CATEGORY II). Jimmy Picket MD Pathologist, Electronic Signature (Case signed 06/19/2016) Specimen Clinical Information Nodule 1 Isthmus Inferior 4.0 x 2.6 x 2.7 cm solid / almost completely solid, Isoechoic, ACR TI-RADS total points:4, Moderately suspicious nodule Source  Thyroid, Fine Needle Aspiration, Isthmus, (Specimen 2 of 2, collected on 06/18/16 )  Thyroid U/S (06/16/2019):  Parenchymal Echotexture: Markedly heterogenous Isthmus: 2.7 cm Right lobe: 5.8 x 2.5 x 2.3 cm Left lobe: 8.2 x 3.1 x 2.7 cm ________________________________________________________  Estimated total number of nodules >/= 1 cm: 5  Nodule # 1: Prior biopsy: No Location: Right; Inferior Maximum size: 1.4 cm; Other 2 dimensions: 1.1 x 1.1 cm, previously, 1.6 x 0.9 x 1 cm Composition: solid/almost completely solid (2) Echogenicity: isoechoic (1)  Given size (<1.4 cm) and appearance, this nodule does NOT meet TI-RADS criteria for biopsy or dedicated follow-up. _________________________________________________________  Nodule # 2: Prior biopsy: Yes Location: Isthmus; Mid Maximum size: 2.3 cm; Other 2 dimensions: 1.7 x 2.5 cm, previously, 4 x 2.6 x 2.7 cm Composition: solid/almost completely solid (2) Echogenicity: isoechoic (1) Echogenic foci: macrocalcifications (1)  ACR TI-RADS recommendations: This thyroid nodule was previously biopsied. Additionally, this thyroid nodule has significantly decreased in size since the prior study. _________________________________________________________  Nodule # 3: Prior biopsy: No Location: Left; Superior Maximum size: 1.5 cm; Other 2 dimensions: 1.4 x 1.4 cm, previously, 2.1 x 1.8 x 2 cm Composition: solid/almost completely solid (2) Echogenicity: hypoechoic  (2)  **Given  size (>/= 1.5 cm) and appearance, fine needle aspiration of this moderately suspicious nodule should be considered based on TI-RADS criteria. This thyroid nodule has decreased in size since the prior study.  _________________________________________________________  Nodule # 4: Prior biopsy: No Location: Left; Mid Maximum size: 2 cm; Other 2 dimensions: 1.9 x 2.1 cm, previously, 0.9 x 1.3 x 1.6 cm Composition: solid/almost completely solid (2) Echogenicity: hypoechoic (2) Margins: lobulated/irregular (2) Significant change in size (>/= 20% in two dimensions and minimal increase of 2 mm): Yes Change in features: Yes  **Given size (>/= 1.5 cm) and appearance, fine needle aspiration of this moderately suspicious nodule should be considered based on TI-RADS criteria. _________________________________________________________  The dominant thyroid nodule in the left inferior thyroid gland has slightly increased in size from the prior study. This thyroid nodule was previously biopsied.  IMPRESSION: 1. Multinodular goiter as detailed above. 2. There is slight interval growth of the previously biopsied thyroid nodule in the inferior left thyroid gland. Correlation with prior biopsy results is recommended. 3. Significant interval increase in size of a left mid thyroid nodule measuring approximately 2 cm. Fine-needle aspiration is recommended for this thyroid nodule. 4. Interval decrease in size of a 1.5 cm thyroid nodule in the left superior thyroid gland. Despite interval decrease in size, this thyroid nodule still meets criteria for fine-needle aspiration. 5. Significant interval decrease in size of the isthmus thyroid nodule. This thyroid nodule was previously biopsied. Correlation with prior biopsy results is recommended. 6. Interval decrease in size of the right inferior thyroid nodule. This thyroid nodule no longer meets criteria for follow-up.  The above  is in keeping with the ACR TI-RADS recommendations - J Am Coll Radiol 2017;14:587-595.  Pt denies: - feeling nodules in neck - dysphagia - choking At last visit, she was describing feeling that her food was stuck in her throat.  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  However, she did not feel that the symptoms are severe enough to mandate thyroidectomy.  As described above, 6 months ago, her symptoms worsen, she feels more pressure in the neck, especially with lying down, and also some cough and significant hoarseness, also evident at today's visit.  She is having a hard time staying in.  I reviewed pt's thyroid tests: Lab Results  Component Value Date   TSH 0.87 01/13/2017   TSH 0.51 02/22/2016   TSH 0.315 (L) 05/11/2015   FREET4 1.2 02/22/2016   FREET4 1.5 05/11/2015    + FH of thyroid ds in MGM >> goiter. No FH of thyroid cancer. No h/o radiation tx to head or neck.  No recent contrast studies. No herbal supplements. No Biotin use. No recent steroids use.   Pt also has a history of asthma -worse in winter. Had this since being a child.  ROS: Constitutional: + Weight gain/no weight loss, no fatigue, no subjective hyperthermia, no subjective hypothermia Eyes: no blurry vision, no xerophthalmia ENT: no sore throat, + see HPI Cardiovascular: no CP/no SOB/no palpitations/no leg swelling Respiratory: no cough/no SOB/no wheezing Gastrointestinal: no N/no V/no D/no C/+ acid reflux Musculoskeletal: no muscle aches/no joint aches Skin: no rashes, no hair loss Neurological: no tremors/no numbness/no tingling/no dizziness  I reviewed pt's medications, allergies, PMH, social hx, family hx, and changes were documented in the history of present illness. Otherwise, unchanged from my initial visit note.  Past Medical History:  Diagnosis Date  . Allergy   . Asthma    . Hypertension   . Multiple thyroid nodules    per patient   . PONV (postoperative nausea and vomiting)    with D+ C and tonsillectomy   . Pre-diabetes    Past Surgical History:  Procedure Laterality Date  . DILATION AND CURETTAGE OF UTERUS    . LAPAROSCOPIC GASTRIC SLEEVE RESECTION N/A 03/02/2017   Procedure: LAPAROSCOPIC GASTRIC SLEEVE RESECTION, UPPER ENDO;  Surgeon: Luretha Murphy, MD;  Location: [facility];  Service: General;  Laterality: N/A;  . TONSILLECTOMY AND ADENOIDECTOMY     Social History   Social History  . Marital status: Divorced    Spouse name: N/A  . Number of children: 2   Occupational History  . 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 Smoking status: Never Smoker  . Smokeless tobacco: Never Used  . Alcohol use No  . Drug use: No   Social History Narrative   G4P2  (2 miscarriages)    Household-- pt and 2 children   Current Outpatient Medications on File Prior to Visit  Medication Sig Dispense Refill  . acetaminophen (TYLENOL) 500 MG tablet Take 2 tablets (1,000 mg total) by mouth every 8 (eight) hours as needed for moderate pain. 30 tablet 0  . albuterol (VENTOLIN HFA) 108 (90 Base) MCG/ACT inhaler Inhale 1-2 puffs into the lungs every 4 (four) hours as needed for wheezing or shortness of breath. 18 g 5  . cetirizine (ZYRTEC) 10 MG tablet Take 1 tablet (10 mg total) by mouth at bedtime. 30 tablet 5  . FLOVENT HFA 110 MCG/ACT inhaler     . fluticasone (FLONASE) 50 MCG/ACT nasal spray Place 1 spray into both nostrils daily. 16 g 2  . ketoconazole (NIZORAL) 2 % cream Apply 1 application topically daily. Area of infection is extensive, please provide 120 g (Patient not taking: Reported on 06/14/2019) 120 g 1  . meclizine (ANTIVERT) 25 MG tablet Take 1 tablet (25 mg total) by mouth 3 (three) times daily as needed for dizziness. (Patient not taking: Reported on 06/14/2019) 30 tablet 0  . methocarbamol (ROBAXIN) 500 MG  tablet Take 1 tablet (500 mg total) by mouth 2 (two) times daily. 20 tablet 0  . ondansetron (ZOFRAN) 4 MG tablet Take 1 tablet (4 mg total) by mouth every 8 (eight) hours as needed for nausea or vomiting. 12 tablet 0  . pantoprazole (PROTONIX) 40 MG tablet Take 40 mg by mouth daily.    . phentermine 37.5 MG capsule Take 1 capsule by mouth daily.     No current facility-administered medications on file prior to visit.   Allergies  Allergen Reactions  . Amoxicillin-Pot Clavulanate Diarrhea, Other (See Comments) and Nausea Only   Family History  Problem Relation Age of Onset  . Hyperlipidemia Father   . Diabetes Maternal Grandmother   . Breast cancer Maternal Grandmother   . Heart disease Paternal Grandmother   . Hypertension Paternal Grandmother   . Stroke Other   . Colon cancer Neg Hx    PE: BP 120/82   Pulse 88   Ht 5\' 5"  (1.651 m)   Wt (!) 316 lb (143.3 kg)   LMP 06/06/2019 (Exact Date)   SpO2 99%   BMI 52.59 kg/m  Wt Readings from Last 3 Encounters:  07/05/19 (!) 316 lb (143.3 kg)  06/14/19 (!) 304 lb (137.9 kg)  03/21/19 297 lb (  134.7 kg)   Constitutional: overweight, in NAD Eyes: PERRLA, EOMI, no exophthalmos ENT: moist mucous membranes, no thyromegaly, no cervical lymphadenopathy Cardiovascular: RRR, No MRG Respiratory: CTA B Gastrointestinal: abdomen soft, NT, ND, BS+ Musculoskeletal: no deformities, strength intact in all 4 Skin: moist, warm, no rashes Neurological: no tremor with outstretched hands, DTR normal in all 4  ASSESSMENT: 1. Multiple thyroid nodules  PLAN: 1. Multiple thyroid nodules -Patient with a history of several thyroid nodules, whom I initially seen in 2018.  At that time, she had some mild neck compression symptoms, but not severe enough to require surgery.  We biopsied the dominant thyroid nodules in the left lobe, which are measuring approximately 4 cm each.  The biopsies were benign. -At this visit, she returns after she had another  ultrasound by PCP earlier this month.  The ultrasound shows that one of the previously biopsied nodule (the isthmic nodule) is significantly smaller than before, while the left inferior nodule has increased slightly.  However, with a benign biopsy, malignancy risk is still low for this nodule.  She does have, however, 2 other nodules that merit attention.:  A Left superior 1.5 cm nodule that is hypoechoic  A Left medial 2 cm nodule with irregular margins -At this visit, we discussed about biopsying these 2 nodules while keeping an eye on the other 2 nodules that we biopsied in the past.  She agrees with this plan -She does not have a family history of thyroid cancer or personal history of radiation therapy to head or neck, which would increase her risk of malignancy. -Her neck compression symptoms have  increased since last visit and she has significant neck pressure, cough, hoarseness. 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  However, with a conservative approach, she probably can have left thyroidectomy safely.  I will refer her for consultation with Dr. Gerrit Friends. -I reviewed her TFTs and these have been normal, but the last TSH is from 2018: Lab Results  Component Value Date   TSH 0.87 01/13/2017  -We'll recheck her TFTs today -I will see her back in 3 months, but will repeat her TFTs approximately 1 month after surgery  FNA x2 (07/12/2019): Specimen Submitted: A. THYROID GLAND, LEFT, LUP, FINE NEEDLE  ASPIRATION:   Clinical History: Left; Superior 1.5 cm; Other 2 dimensions: 1.4 x 1.4  cm; Previously 2.1 x 1.8 x 2 cm, Solid/almost completely solid;  Hypoechoic; TI-RADS: 4   FINAL MICROSCOPIC DIAGNOSIS:  - Consistent with benign follicular nodule (Bethesda category II)   SPECIMEN ADEQUACY:  Satisfactory for  evaluation   ___________________________________________________________  Specimen Submitted: A. THYROID GLAND, LEFT, LMP, FINE NEEDLE  ASPIRATION:   Clinical History: Left; Mid 2 cm; Other 2 dimensions: 1.9 x 2.1 cm;  Previously 0.9 x 1.3 x 1.6 cm; Solid/almost completely solid;  Hypoechoic; Lobulated/irregular margins; TI-RADS: 6   FINAL MICROSCOPIC DIAGNOSIS:  - Scant follicular epithelium present (Bethesda category I)   SPECIMEN ADEQUACY:  Satisfactory for evaluation   Benign biopsy of the left superior thyroid nodule, however, the left medial nodule biopsy was inconclusive due to scant follicular epithelium present.   At this point, due to her symptoms, I would like to send her for possible thyroidectomy to Dr. Gerrit Friends.  Carlus Pavlov, MD PhD [facility]

## 2019-07-05 NOTE — Patient Instructions (Signed)
Please stop at the lab.  We need a new thyroid test ~1 month after thyroid surgery.  Please come back for a follow-up appointment in 3 months.

## 2019-07-12 ENCOUNTER — Ambulatory Visit
Admission: RE | Admit: 2019-07-12 | Discharge: 2019-07-12 | Disposition: A | Discharge status: Home / Self Care | Payer: BC Managed Care – PPO | Source: Ambulatory Visit | Attending: Internal Medicine | Admitting: Internal Medicine

## 2019-07-12 ENCOUNTER — Other Ambulatory Visit (HOSPITAL_COMMUNITY)
Admission: RE | Admit: 2019-07-12 | Discharge: 2019-07-12 | Disposition: A | Discharge status: Home / Self Care | Payer: BC Managed Care – PPO | Source: Ambulatory Visit | Attending: Radiology | Admitting: Radiology

## 2019-07-12 DIAGNOSIS — E042 Nontoxic multinodular goiter: Secondary | ICD-10-CM

## 2019-07-12 DIAGNOSIS — E041 Nontoxic single thyroid nodule: Secondary | ICD-10-CM | POA: Diagnosis not present

## 2019-07-13 DIAGNOSIS — R030 Elevated blood-pressure reading, without diagnosis of hypertension: Secondary | ICD-10-CM | POA: Diagnosis not present

## 2019-07-13 DIAGNOSIS — Z20828 Contact with and (suspected) exposure to other viral communicable diseases: Secondary | ICD-10-CM | POA: Diagnosis not present

## 2019-07-13 DIAGNOSIS — Z03818 Encounter for observation for suspected exposure to other biological agents ruled out: Secondary | ICD-10-CM | POA: Diagnosis not present

## 2019-07-13 LAB — CYTOLOGY - NON PAP

## 2019-07-14 DIAGNOSIS — Z23 Encounter for immunization: Secondary | ICD-10-CM | POA: Diagnosis not present

## 2019-07-27 DIAGNOSIS — N920 Excessive and frequent menstruation with regular cycle: Secondary | ICD-10-CM | POA: Insufficient documentation

## 2019-07-27 DIAGNOSIS — Z6841 Body Mass Index (BMI) 40.0 and over, adult: Secondary | ICD-10-CM | POA: Diagnosis not present

## 2019-08-11 ENCOUNTER — Ambulatory Visit: Payer: Self-pay | Admitting: Surgery

## 2019-08-11 DIAGNOSIS — R499 Unspecified voice and resonance disorder: Secondary | ICD-10-CM | POA: Diagnosis not present

## 2019-08-11 DIAGNOSIS — E042 Nontoxic multinodular goiter: Secondary | ICD-10-CM | POA: Diagnosis not present

## 2019-08-21 ENCOUNTER — Other Ambulatory Visit: Payer: Self-pay

## 2019-08-21 ENCOUNTER — Emergency Department (HOSPITAL_BASED_OUTPATIENT_CLINIC_OR_DEPARTMENT_OTHER): Payer: BC Managed Care – PPO

## 2019-08-21 ENCOUNTER — Emergency Department (HOSPITAL_BASED_OUTPATIENT_CLINIC_OR_DEPARTMENT_OTHER)
Admission: EM | Admit: 2019-08-21 | Discharge: 2019-08-21 | Disposition: A | Discharge status: Home / Self Care | Payer: BC Managed Care – PPO | Attending: Emergency Medicine | Admitting: Emergency Medicine

## 2019-08-21 ENCOUNTER — Encounter (HOSPITAL_BASED_OUTPATIENT_CLINIC_OR_DEPARTMENT_OTHER): Payer: Self-pay | Admitting: Emergency Medicine

## 2019-08-21 DIAGNOSIS — Z79899 Other long term (current) drug therapy: Secondary | ICD-10-CM | POA: Diagnosis not present

## 2019-08-21 DIAGNOSIS — R079 Chest pain, unspecified: Secondary | ICD-10-CM

## 2019-08-21 DIAGNOSIS — R202 Paresthesia of skin: Secondary | ICD-10-CM | POA: Diagnosis not present

## 2019-08-21 DIAGNOSIS — I10 Essential (primary) hypertension: Secondary | ICD-10-CM | POA: Insufficient documentation

## 2019-08-21 DIAGNOSIS — J45909 Unspecified asthma, uncomplicated: Secondary | ICD-10-CM | POA: Insufficient documentation

## 2019-08-21 DIAGNOSIS — Z20822 Contact with and (suspected) exposure to covid-19: Secondary | ICD-10-CM | POA: Insufficient documentation

## 2019-08-21 DIAGNOSIS — R0602 Shortness of breath: Secondary | ICD-10-CM | POA: Diagnosis not present

## 2019-08-21 LAB — BASIC METABOLIC PANEL
Anion gap: 8 (ref 5–15)
BUN: 14 mg/dL (ref 6–20)
CO2: 21 mmol/L — ABNORMAL LOW (ref 22–32)
Calcium: 8.9 mg/dL (ref 8.9–10.3)
Chloride: 110 mmol/L (ref 98–111)
Creatinine, Ser: 0.82 mg/dL (ref 0.44–1.00)
GFR calc Af Amer: >60 mL/min (ref >60)
GFR calc non Af Amer: >60 mL/min (ref >60)
Glucose, Bld: 84 mg/dL (ref 70–99)
Potassium: 4 mmol/L (ref 3.5–5.1)
Sodium: 139 mmol/L (ref 135–145)

## 2019-08-21 LAB — CBC
HCT: 36.4 % (ref 36.0–46.0)
Hemoglobin: 11.7 g/dL — ABNORMAL LOW (ref 12.0–15.0)
MCH: 26.7 pg (ref 26.0–34.0)
MCHC: 32.1 g/dL (ref 30.0–36.0)
MCV: 83.1 fL (ref 80.0–100.0)
Platelets: 200 10*3/uL (ref 150–400)
RBC: 4.38 MIL/uL (ref 3.87–5.11)
RDW: 15 % (ref 11.5–15.5)
WBC: 8.5 10*3/uL (ref 4.0–10.5)
nRBC: 0 % (ref 0.0–0.2)

## 2019-08-21 LAB — TROPONIN I (HIGH SENSITIVITY)
Troponin I (High Sensitivity): 5 ng/L (ref <18)
Troponin I (High Sensitivity): 5 ng/L (ref <18)

## 2019-08-21 LAB — SARS CORONAVIRUS 2 AG (30 MIN TAT): SARS Coronavirus 2 Ag: NEGATIVE

## 2019-08-21 LAB — D-DIMER, QUANTITATIVE: D-Dimer, Quant: 0.8 ug/mL-FEU — ABNORMAL HIGH (ref 0.00–0.50)

## 2019-08-21 LAB — PREGNANCY, URINE: Preg Test, Ur: NEGATIVE

## 2019-08-21 MED ORDER — IOHEXOL 350 MG/ML SOLN
100.0000 mL | Freq: Once | INTRAVENOUS | Status: AC | PRN
  Administered 2019-08-21: 100 mL via INTRAVENOUS

## 2019-08-21 NOTE — Discharge Instructions (Signed)
Recommend follow-up with your primary doctor regarding the symptoms that you are experiencing today.  If you develop worsening chest pain, difficulty breathing or other new concerning symptom, please return to ER for reassessment.

## 2019-08-21 NOTE — ED Provider Notes (Signed)
[facility] EMERGENCY DEPARTMENT Provider Note   CSN: [id number] Arrival date & time: 08/21/19  1439     History Chief Complaint  Patient presents with  . Chest Pain  . Numbness    Monica Cantu is a 34 y.o. female.  Presents to the ER with concern for chest pain.  Patient states that symptoms ongoing for the last couple days, dull, achy sensation, worse on right side, also has tingling sensation that occurs in her arms and legs, states that this tingling sensation has been going on for a long time, many weeks.  No real changes today.  No weakness.  No difficulty in breathing, no fever, no cough.  History of thyroid goiter, has plans for surgery next week.  HPI     Past Medical History:  Diagnosis Date  . Allergy   . Asthma   . Hypertension   . Multiple thyroid nodules    per patient   . PONV (postoperative nausea and vomiting)    with D+ C and tonsillectomy   . Pre-diabetes     Patient Active Problem List   Diagnosis Date Noted  . S/P laparoscopic sleeve gastrectomyNov 2018 03/02/2017  . Low back pain radiating to left leg 09/30/2016  . Multinodular goiter 05/27/2016  . Annual physical exam 02/22/2016  . PCP NOTES >>>>>>>>>>>>>>>>>>>>> 05/31/2015  . Asthma 05/11/2015  . Morbid obesity (HCC) 04/24/2015  . Elevated blood pressure 04/24/2015    Past Surgical History:  Procedure Laterality Date  . DILATION AND CURETTAGE OF UTERUS    . LAPAROSCOPIC GASTRIC SLEEVE RESECTION N/A 03/02/2017   Procedure: LAPAROSCOPIC GASTRIC SLEEVE RESECTION, UPPER ENDO;  Surgeon: Luretha Murphy, MD;  Location: [facility];  Service: General;  Laterality: N/A;  . TONSILLECTOMY AND ADENOIDECTOMY       OB History    Gravida  4   Para  2   Term  2   Preterm  0   AB  2   Living  2     SAB  2   TAB      Ectopic  0   Multiple      Live Births  2           Family History  Problem Relation Age of Onset  . Hyperlipidemia Father   . Diabetes Maternal  Grandmother   . Breast cancer Maternal Grandmother   . Heart disease Paternal Grandmother   . Hypertension Paternal Grandmother   . Stroke Other   . Colon cancer Neg Hx     Social History   Tobacco Use  . Smoking status: Never Smoker  . Smokeless tobacco: Never Used  Substance Use Topics  . Alcohol use: No    Alcohol/week: 0.0 standard drinks  . Drug use: No    Home Medications Prior to Admission medications   Medication Sig Start Date End Date Taking? Authorizing Provider  acetaminophen (TYLENOL) 500 MG tablet Take 2 tablets (1,000 mg total) by mouth every 8 (eight) hours as needed for moderate pain. 11/07/18   Law, Waylan Boga, PA-C  albuterol (VENTOLIN HFA) 108 (90 Base) MCG/ACT inhaler Inhale 1-2 puffs into the lungs every 4 (four) hours as needed for wheezing or shortness of breath. 05/18/18   Wanda Plump, MD  cetirizine (ZYRTEC) 10 MG tablet Take 1 tablet (10 mg total) by mouth at bedtime. 08/11/16   Wanda Plump, MD  FLOVENT [facility] 110 MCG/ACT inhaler  05/18/18   [provider]  fluticasone (FLONASE) 50  MCG/ACT nasal spray Place 1 spray into both nostrils daily. 01/23/19   Georgetta Haber, NP  ketoconazole (NIZORAL) 2 % cream Apply 1 application topically daily. Area of infection is extensive, please provide 120 g Patient not taking: Reported on 06/14/2019 12/11/17   Wanda Plump, MD  meclizine (ANTIVERT) 25 MG tablet Take 1 tablet (25 mg total) by mouth 3 (three) times daily as needed for dizziness. Patient not taking: Reported on 06/14/2019 02/02/18   Liberty Handy, PA-C  methocarbamol (ROBAXIN) 500 MG tablet Take 1 tablet (500 mg total) by mouth 2 (two) times daily. 11/07/18   Law, Waylan Boga, PA-C  ondansetron (ZOFRAN) 4 MG tablet Take 1 tablet (4 mg total) by mouth every 8 (eight) hours as needed for nausea or vomiting. 04/11/19   Georgetta Haber, NP  pantoprazole (PROTONIX) 40 MG tablet Take 40 mg by mouth daily.    [provider]  phentermine 37.5 MG capsule  Take 1 capsule by mouth daily. 02/16/19   [provider]    Allergies    Amoxicillin-pot clavulanate  Review of Systems   Review of Systems  Constitutional: Negative for chills and fever.  HENT: Negative for ear pain and sore throat.   Eyes: Negative for pain and visual disturbance.  Respiratory: Negative for cough and shortness of breath.   Cardiovascular: Positive for chest pain. Negative for palpitations.  Gastrointestinal: Negative for abdominal pain and vomiting.  Genitourinary: Negative for dysuria and hematuria.  Musculoskeletal: Negative for arthralgias and back pain.  Skin: Negative for color change and rash.  Neurological: Negative for seizures and syncope.  All other systems reviewed and are negative.   Physical Exam Updated Vital Signs BP 104/62   Pulse 74   Temp 99.6 F (37.6 C) (Oral)   Resp 20   Ht 5\' 5"  (1.651 m)   Wt (!) 147 kg   LMP 08/07/2019   SpO2 100%   BMI 53.92 kg/m   Physical Exam Vitals and nursing note reviewed.  Constitutional:      General: She is not in acute distress.    Appearance: She is well-developed.  HENT:     Head: Normocephalic and atraumatic.  Eyes:     Conjunctiva/sclera: Conjunctivae normal.  Cardiovascular:     Rate and Rhythm: Normal rate and regular rhythm.     Heart sounds: Normal heart sounds. No murmur.  Pulmonary:     Effort: Pulmonary effort is normal. No respiratory distress.     Breath sounds: Normal breath sounds.  Abdominal:     Palpations: Abdomen is soft.     Tenderness: There is no abdominal tenderness.  Musculoskeletal:     Cervical back: Neck supple.     Right lower leg: No edema.     Left lower leg: No edema.  Skin:    General: Skin is warm and dry.     Capillary Refill: Capillary refill takes less than 2 seconds.  Neurological:     Mental Status: She is alert.     Comments: Alert, oriented x3, strength, sensation intact in all 4 extremities, normal gait     ED Results / Procedures  / Treatments   Labs (all labs ordered are listed, but only abnormal results are displayed) Labs Reviewed  CBC - Abnormal; Notable for the following components:      Result Value   Hemoglobin 11.7 (*)    All other components within normal limits  D-DIMER, QUANTITATIVE (NOT AT [facility]) - Abnormal; Notable for  the following components:   D-Dimer, Quant 0.80 (*)    All other components within normal limits  BASIC METABOLIC PANEL - Abnormal; Notable for the following components:   CO2 21 (*)    All other components within normal limits  SARS CORONAVIRUS 2 AG (30 MIN TAT)  PREGNANCY, URINE  TROPONIN I (HIGH SENSITIVITY)  TROPONIN I (HIGH SENSITIVITY)    EKG EKG Interpretation  Date/Time:  Sunday Aug 21 2019 14:51:23 EDT Ventricular Rate:  114 PR Interval:  152 QRS Duration: 84 QT Interval:  300 QTC Calculation: 413 R Axis:   78 Text Interpretation: Sinus tachycardia Nonspecific T wave abnormality Abnormal ECG Confirmed by Marianna Fuss ([id number]) on 08/21/2019 4:25:07 PM   Radiology DG Chest 2 View  Result Date: 08/21/2019 CLINICAL DATA:  Chest pain EXAM: CHEST - 2 VIEW COMPARISON:  Radiograph 03/21/2019 FINDINGS: No consolidation, features of edema, pneumothorax, or effusion. Pulmonary vascularity is normally distributed. The cardiomediastinal contours are unremarkable. No acute osseous or soft tissue abnormality. IMPRESSION: No acute cardiopulmonary abnormality. Electronically Signed   By: Kreg Shropshire M.D.   On: 08/21/2019 15:12   CT Angio Chest PE W and/or Wo Contrast  Result Date: 08/21/2019 CLINICAL DATA:  Shortness of breath, elevated D-dimer EXAM: CT ANGIOGRAPHY CHEST WITH CONTRAST TECHNIQUE: Multidetector CT imaging of the chest was performed using the standard protocol during bolus administration of intravenous contrast. Multiplanar CT image reconstructions and MIPs were obtained to evaluate the vascular anatomy. CONTRAST:  OMNIPAQUE IOHEXOL 350 MG/ML SOLN COMPARISON:   Chest radiograph 08/21/2019, thyroid ultrasound 06/16/2019 FINDINGS: Cardiovascular: Satisfactory opacification of the pulmonary arteries to the segmental level. No evidence of pulmonary embolism. Normal heart size. No pericardial effusion. Normal heart size. No pericardial effusion. The aorta is normal caliber. Normal 3 vessel branching of the aortic arch without acute abnormality of the proximal great vessels. Mediastinum/Nodes: Heterogeneous, enlarged thyroid gland predominantly involving the left lobe and demonstrating mass effect with lateral displacement and partial compression of upper trachea (6/12). Extends inferiorly into the thoracic inlet. No other acute abnormality of the trachea nor of the esophagus. No concerning mediastinal, hilar or axillary adenopathy. Lungs/Pleura: Dependent atelectatic changes likely accentuated by imaging during exhalation. No consolidation, features of edema, pneumothorax, or effusion. No suspicious pulmonary nodules or masses. Upper Abdomen: No acute abnormalities present in the visualized portions of the upper abdomen. Musculoskeletal: Multilevel degenerative changes are present in the imaged portions of the spine. No chest wall mass or suspicious bone lesions identified. Review of the MIP images confirms the above findings. IMPRESSION: 1. No evidence of pulmonary embolism. 2. No acute intrathoracic process. 3. Heterogeneous, enlarged thyroid gland predominantly involving the left lobe and demonstrating mild mass effect with lateral displacement and partial compression of the upper trachea. Patient is undergone prior thyroid ultrasound and FNA biopsy with features of multinodular goiter. Please refer to chart for further details. Electronically Signed   By: Kreg Shropshire M.D.   On: 08/21/2019 18:10    Procedures Procedures (including critical care time)  Medications Ordered in ED Medications  iohexol (OMNIPAQUE) 350 MG/ML injection 100 mL (100 mLs Intravenous Contrast  Given 08/21/19 1750)    ED Course  I have reviewed the triage vital signs and the nursing notes.  Pertinent labs & imaging results that were available during my care of the patient were reviewed by me and considered in my medical decision making (see chart for details).    MDM Rules/Calculators/A&P  34 year old lady presenting to ER with concern for chest pain and arm and leg tingling sensations.  On exam patient well-appearing, normal vital signs, noted initial temp 99.6.  EKG without ischemic changes, troponin within normal limits.  Doubt ACS.  D-dimer was slightly elevated, CTA chest however negative for acute pulmonary embolism.  Did show a thyroid goiter which patient is already aware of and is actually planning on surgery.  On reassessment patient remains well-appearing, no ongoing pain, believe she is appropriate for discharge and outpatient management.  Recommended recheck with her primary doctor.    After the discussed management above, the patient was determined to be safe for discharge.  The patient was in agreement with this plan and all questions regarding their care were answered.  ED return precautions were discussed and the patient will return to the ED with any significant worsening of condition.  Final Clinical Impression(s) / ED Diagnoses Final diagnoses:  Chest pain, unspecified type    Rx / DC Orders ED Discharge Orders    None       Lucrezia Starch, MD 08/21/19 2306

## 2019-08-21 NOTE — ED Triage Notes (Signed)
Pt c/o generalized chest pain with numbness and tingling to B/L arms onset yesterday with shortness of breath and sweating.

## 2019-08-22 ENCOUNTER — Encounter: Payer: Self-pay | Admitting: Internal Medicine

## 2019-08-22 ENCOUNTER — Ambulatory Visit (INDEPENDENT_AMBULATORY_CARE_PROVIDER_SITE_OTHER): Payer: BC Managed Care – PPO | Admitting: Internal Medicine

## 2019-08-22 VITALS — BP 134/77 | HR 78 | Temp 97.3°F | Resp 18 | Ht 65.0 in | Wt 313.5 lb

## 2019-08-22 DIAGNOSIS — M791 Myalgia, unspecified site: Secondary | ICD-10-CM

## 2019-08-22 DIAGNOSIS — R451 Restlessness and agitation: Secondary | ICD-10-CM | POA: Diagnosis not present

## 2019-08-22 DIAGNOSIS — R202 Paresthesia of skin: Secondary | ICD-10-CM | POA: Diagnosis not present

## 2019-08-22 DIAGNOSIS — R079 Chest pain, unspecified: Secondary | ICD-10-CM | POA: Diagnosis not present

## 2019-08-22 NOTE — Progress Notes (Signed)
Pre visit review using our clinic review tool, if applicable. No additional management support is needed unless otherwise documented below in the visit note. 

## 2019-08-22 NOTE — Patient Instructions (Signed)
   GO TO THE FRONT DESK, PLEASE SCHEDULE YOUR APPOINTMENTS Come back for blood work this week at your convenience, no need to be fasting   Come back for a checkup in 3 months.  Call sooner if you are not improving

## 2019-08-22 NOTE — Progress Notes (Signed)
Subjective:    Patient ID: Monica Cantu, female    DOB: [birth year], 34 y.o.   MRN: [id number]  DOS:  08/22/2019 Type of visit - description: ED follow-up Went to the ED yesterday. Symptoms started 1 day prior to the ED visit: Had left upper anterior chest pain, numbness and tingling in all extremities, cramps at the toes fingers ("they were turning in"), restless. Work-up at the emergency department included: Chest x-ray, hemoglobin 11.7, pregnancy test negative, troponin negative, Covid negative, D-dimer slightly positive, CT angio negative.  No pulmonary emboli.  They did notice thyromegaly.    Since then, the chest pain has improved but she continue with the other symptoms.   Review of Systems Denies fever chills. No cough Denies feeling anxious Denies taking any OTC decongestant or diet pills (did take some pills to lose weight few weeks ago). When asked, she did report bilateral upper neck pain and generalized aches and pains.  No gait difficulties, no motor deficit  Past Medical History:  Diagnosis Date  . Allergy   . Asthma   . Hypertension   . Multiple thyroid nodules    per patient   . PONV (postoperative nausea and vomiting)    with D+ C and tonsillectomy   . Pre-diabetes     Past Surgical History:  Procedure Laterality Date  . DILATION AND CURETTAGE OF UTERUS    . LAPAROSCOPIC GASTRIC SLEEVE RESECTION N/A 03/02/2017   Procedure: LAPAROSCOPIC GASTRIC SLEEVE RESECTION, UPPER ENDO;  Surgeon: Johnathan Hausen, MD;  Location: [facility];  Service: General;  Laterality: N/A;  . TONSILLECTOMY AND ADENOIDECTOMY      Allergies as of 08/22/2019      Reactions   Amoxicillin-pot Clavulanate Diarrhea, Other (See Comments), Nausea Only      Medication List       Accurate as of Aug 22, 2019  3:56 PM. If you have any questions, ask your nurse or doctor.        acetaminophen 500 MG tablet Commonly known as: TYLENOL Take 2 tablets (1,000 mg total) by mouth every 8  (eight) hours as needed for moderate pain.   albuterol 108 (90 Base) MCG/ACT inhaler Commonly known as: Ventolin HFA Inhale 1-2 puffs into the lungs every 4 (four) hours as needed for wheezing or shortness of breath.   cetirizine 10 MG tablet Commonly known as: ZYRTEC Take 1 tablet (10 mg total) by mouth at bedtime.   Flovent HFA 110 MCG/ACT inhaler Generic drug: fluticasone   fluticasone 50 MCG/ACT nasal spray Commonly known as: FLONASE Place 1 spray into both nostrils daily.   ketoconazole 2 % cream Commonly known as: NIZORAL Apply 1 application topically daily. Area of infection is extensive, please provide 120 g   meclizine 25 MG tablet Commonly known as: ANTIVERT Take 1 tablet (25 mg total) by mouth 3 (three) times daily as needed for dizziness.   methocarbamol 500 MG tablet Commonly known as: ROBAXIN Take 1 tablet (500 mg total) by mouth 2 (two) times daily.   ondansetron 4 MG tablet Commonly known as: ZOFRAN Take 1 tablet (4 mg total) by mouth every 8 (eight) hours as needed for nausea or vomiting.   pantoprazole 40 MG tablet Commonly known as: PROTONIX Take 40 mg by mouth daily.   phentermine 37.5 MG capsule Take 1 capsule by mouth daily.          Objective:   Physical Exam BP 134/77 (BP Location: Left Wrist, Patient Position: Sitting, Cuff Size: Small)  Pulse 78   Temp (!) 97.3 F (36.3 C) (Temporal)   Resp 18   Ht 5\' 5"  (1.651 m)   Wt (!) 313 lb 8 oz (142.2 kg)   LMP 08/07/2019   SpO2 99%   BMI 52.17 kg/m  General:   Well developed, NAD, BMI noted. HEENT:  Normocephalic . Face symmetric, atraumatic Lungs:  CTA B Normal respiratory effort, no intercostal retractions, no accessory muscle use. Heart: RRR,  no murmur.  Lower extremities: no pretibial edema bilaterally  Skin: Not pale. Not jaundice Neurologic:  alert & oriented X3.  Speech normal, gait appropriate for age and unassisted Motor symmetric, DTRs symmetric, no tremors or  fasciculations noted at the upper extremities Psych--  Cognition and judgment appear intact.  Cooperative with normal attention span and concentration.  Behavior appropriate. No anxious or depressed appearing.      Assessment     Assessment Prediabetes: A1c 6.0 05-2015 Elevated BP w/o dx of HTN Thyromegaly-nodules (see 02-22-1972 04-2015), bx rx per endo : (-) 06-2016 Asthma; Allergies Morbid obesity- bariatric surgery 02-2017  H/o fibroids   PLAN:   Multiple symptoms: Chest pain, generalized aches, upper and lower extremity paresthesias, feeling restless. Work-up in the ER reviewed, essentially negative. Neurological exam is nonfocal Etiology is not completely clear, for completeness we agreed to check a B12, folic acid, total CK and sed rate.  If negative will observe her symptoms for few weeks.  Call anytime if symptoms severe Thyromegaly: Since the last visit, had labs, ultrasound, saw endocrinology and eventually referred to general surgery for a partial thyroidectomy which is pending.   This visit occurred during the SARS-CoV-2 public health emergency.  Safety protocols were in place, including screening questions prior to the visit, additional usage of staff PPE, and extensive cleaning of exam room while observing appropriate contact time as indicated for disinfecting solutions.

## 2019-08-23 NOTE — Assessment & Plan Note (Signed)
Multiple symptoms: Chest pain, generalized aches, upper and lower extremity paresthesias, feeling restless. Work-up in the ER reviewed, essentially negative. Neurological exam is nonfocal Etiology is not completely clear, for completeness we agreed to check a B12, folic acid, total CK and sed rate.  If negative will observe her symptoms for few weeks.  Call anytime if symptoms severe Thyromegaly: Since the last visit, had labs, ultrasound, saw endocrinology and eventually referred to general surgery for a partial thyroidectomy which is pending.

## 2019-08-24 ENCOUNTER — Other Ambulatory Visit: Payer: Self-pay

## 2019-08-24 ENCOUNTER — Other Ambulatory Visit (INDEPENDENT_AMBULATORY_CARE_PROVIDER_SITE_OTHER): Payer: BC Managed Care – PPO

## 2019-08-24 DIAGNOSIS — R202 Paresthesia of skin: Secondary | ICD-10-CM

## 2019-08-24 DIAGNOSIS — M791 Myalgia, unspecified site: Secondary | ICD-10-CM

## 2019-08-24 LAB — B12 AND FOLATE PANEL
Folate: 10.3 ng/mL (ref >5.9)
Vitamin B-12: 316 pg/mL (ref 211–911)

## 2019-08-24 LAB — SEDIMENTATION RATE: Sed Rate: 58 mm/hr — ABNORMAL HIGH (ref 0–20)

## 2019-08-24 LAB — CK: Total CK: 144 U/L (ref 7–177)

## 2019-09-12 DIAGNOSIS — Z6841 Body Mass Index (BMI) 40.0 and over, adult: Secondary | ICD-10-CM | POA: Diagnosis not present

## 2019-09-12 DIAGNOSIS — B373 Candidiasis of vulva and vagina: Secondary | ICD-10-CM | POA: Diagnosis not present

## 2019-09-26 ENCOUNTER — Ambulatory Visit (HOSPITAL_COMMUNITY)
Admission: EM | Admit: 2019-09-26 | Discharge: 2019-09-26 | Disposition: A | Discharge status: Home / Self Care | Payer: BC Managed Care – PPO | Attending: Family Medicine | Admitting: Family Medicine

## 2019-09-26 ENCOUNTER — Encounter (HOSPITAL_COMMUNITY): Payer: Self-pay

## 2019-09-26 ENCOUNTER — Other Ambulatory Visit: Payer: Self-pay

## 2019-09-26 DIAGNOSIS — Z20822 Contact with and (suspected) exposure to covid-19: Secondary | ICD-10-CM | POA: Diagnosis not present

## 2019-09-26 DIAGNOSIS — Z7951 Long term (current) use of inhaled steroids: Secondary | ICD-10-CM | POA: Diagnosis not present

## 2019-09-26 DIAGNOSIS — J069 Acute upper respiratory infection, unspecified: Secondary | ICD-10-CM | POA: Diagnosis not present

## 2019-09-26 DIAGNOSIS — J45909 Unspecified asthma, uncomplicated: Secondary | ICD-10-CM | POA: Insufficient documentation

## 2019-09-26 DIAGNOSIS — J029 Acute pharyngitis, unspecified: Secondary | ICD-10-CM | POA: Diagnosis not present

## 2019-09-26 DIAGNOSIS — R7303 Prediabetes: Secondary | ICD-10-CM | POA: Insufficient documentation

## 2019-09-26 DIAGNOSIS — H9202 Otalgia, left ear: Secondary | ICD-10-CM | POA: Diagnosis not present

## 2019-09-26 DIAGNOSIS — R509 Fever, unspecified: Secondary | ICD-10-CM | POA: Diagnosis not present

## 2019-09-26 DIAGNOSIS — I10 Essential (primary) hypertension: Secondary | ICD-10-CM | POA: Insufficient documentation

## 2019-09-26 DIAGNOSIS — Z8249 Family history of ischemic heart disease and other diseases of the circulatory system: Secondary | ICD-10-CM | POA: Insufficient documentation

## 2019-09-26 DIAGNOSIS — R0981 Nasal congestion: Secondary | ICD-10-CM | POA: Insufficient documentation

## 2019-09-26 DIAGNOSIS — Z880 Allergy status to penicillin: Secondary | ICD-10-CM | POA: Diagnosis not present

## 2019-09-26 DIAGNOSIS — Z79899 Other long term (current) drug therapy: Secondary | ICD-10-CM | POA: Insufficient documentation

## 2019-09-26 LAB — POCT RAPID STREP A: Streptococcus, Group A Screen (Direct): NEGATIVE

## 2019-09-26 LAB — SARS CORONAVIRUS 2 (TAT 6-24 HRS): SARS Coronavirus 2: NEGATIVE

## 2019-09-26 MED ORDER — AZITHROMYCIN 250 MG PO TABS
250.0000 mg | ORAL_TABLET | Freq: Every day | ORAL | 0 refills | Status: DC
Start: 2019-09-26 — End: 2019-12-26

## 2019-09-26 MED ORDER — CETIRIZINE-PSEUDOEPHEDRINE ER 5-120 MG PO TB12
1.0000 | ORAL_TABLET | Freq: Every day | ORAL | 0 refills | Status: DC
Start: 2019-09-26 — End: 2020-11-12

## 2019-09-26 NOTE — ED Provider Notes (Signed)
[facility]   [id number] 09/26/19 Arrival Time: 0827   CC: COVID symptoms  SUBJECTIVE: History from: patient.Marland Kitchen  LANNA LABELLA is a 34 y.o. female who presents with abrupt onset of sore throat for the last 3 days. 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 Denies sick exposure to COVID, flu or strep. Denies recent travel. Has not taken OTC medications for this. There are no aggravating symptoms. Reports previous symptoms in the past.  Denies fever, chills, fatigue, sinus pain, rhinorrhea, SOB, wheezing, chest pain, nausea, changes in bowel or bladder habits.    ROS: As per HPI.  All other pertinent ROS negative.     Past Medical History:  Diagnosis Date   Allergy    Asthma    Hypertension    Multiple thyroid nodules    per patient    PONV (postoperative nausea and vomiting)    with D+ C and tonsillectomy    Pre-diabetes    Past Surgical History:  Procedure Laterality Date   DILATION AND CURETTAGE OF UTERUS     LAPAROSCOPIC GASTRIC SLEEVE RESECTION N/A 03/02/2017   Procedure: LAPAROSCOPIC GASTRIC SLEEVE RESECTION, UPPER ENDO;  Surgeon: Luretha Murphy, MD;  Location: [facility];  Service: General;  Laterality: N/A;   TONSILLECTOMY AND ADENOIDECTOMY     Allergies  Allergen Reactions   Amoxicillin-Pot Clavulanate Diarrhea, Other (See Comments) and Nausea Only   No current facility-administered medications on file prior to encounter.   Current Outpatient Medications on File Prior to Encounter  Medication Sig Dispense Refill   albuterol (VENTOLIN HFA) 108 (90 Base) MCG/ACT inhaler Inhale 1-2 puffs into the lungs every 4 (four) hours as needed for wheezing or shortness of breath. 18 g 5   cetirizine (ZYRTEC) 10 MG tablet Take 1 tablet (10 mg total) by mouth at bedtime. 30 tablet 5   FLOVENT HFA 110 MCG/ACT inhaler      fluticasone (FLONASE) 50 MCG/ACT nasal spray Place 1 spray into both nostrils daily. 16 g 2   pantoprazole  (PROTONIX) 40 MG tablet Take 40 mg by mouth daily.     phentermine 37.5 MG capsule Take 1 capsule by mouth daily.     Social History   Socioeconomic History   Marital status: Divorced    Spouse name: Not on file   Number of children: 2   Years of education: Not on file   Highest education level: Not on file  Occupational History   Occupation: Personnel officer fro [employer]  Tobacco Use   Smoking status: Never Smoker   Smokeless tobacco: Never Used  Building services engineer Use: Never used  Substance and Sexual Activity   Alcohol use: No    Alcohol/week: 0.0 standard drinks   Drug use: No   Sexual activity: Yes    Partners: Male    Birth control/protection: None  Other Topics Concern   Not on file  Social History Narrative   G4P2  (2 miscarriages)    Household-- pt and 2 children  (2006, 2012)   Social Determinants of Corporate investment banker Strain:    Difficulty of Paying Living Expenses:   Food Insecurity:    Worried About Programme researcher, broadcasting/film/video in the Last Year:    Barista in the Last Year:   Transportation Needs:    Freight forwarder (Medical):    Lack of Transportation (Non-Medical):   Physical Activity:    Days of Exercise per Week:  Minutes of Exercise per Session:   Stress:    Feeling of Stress :   Social Connections:    Frequency of Communication with Friends and Family:    Frequency of Social Gatherings with Friends and Family:    Attends Religious Services:    Active Member of Clubs or Organizations:    Attends Music therapist:    Marital Status:   Intimate Partner Violence:    Fear of Current or Ex-Partner:    Emotionally Abused:    Physically Abused:    Sexually Abused:    Family History  Problem Relation Age of Onset   Hyperlipidemia Father    Diabetes Maternal Grandmother    Breast cancer Maternal Grandmother    Heart disease Paternal Grandmother    Hypertension Paternal  Grandmother    Stroke Other    Colon cancer Neg Hx     OBJECTIVE:  Vitals:   09/26/19 0854  BP: (!) 93/42  Pulse: 88  Resp: 16  Temp: 99.1 F (37.3 C)  TempSrc: Oral  SpO2: 100%     General appearance: alert; appears fatigued, but nontoxic; speaking in full sentences and tolerating own secretions HEENT: NCAT; Ears: EACs clear, TMs pearly gray; Eyes: PERRL.  EOM grossly intact. Sinuses: nontender; Nose: nares patent without rhinorrhea, Throat: oropharynx clear, tonsils non erythematous or enlarged, uvula midline  Neck: supple without LAD Lungs: unlabored respirations, symmetrical air entry; cough: absent; no respiratory distress; CTAB Heart: regular rate and rhythm.  Radial pulses 2+ symmetrical bilaterally Skin: warm and dry Psychological: alert and cooperative; normal mood and affect  LABS:  Results for orders placed or performed during the hospital encounter of 09/26/19 (from the past 24 hour(s))  POCT rapid strep A [facility])     Status: None   Collection Time: 09/26/19  9:25 AM  Result Value Ref Range   Streptococcus, Group A Screen (Direct) NEGATIVE NEGATIVE     ASSESSMENT & PLAN:  1. URI, acute   2. Sore throat   3. Fever, unspecified fever cause   4. Otalgia, left   5. Nasal congestion    Acute URI Sore Throat Fever Left Otalgia Nasal Congestion  Prescribed azithromycin Prescribed zyrtec D Strep test negative COVID testing ordered.  It will take between 1-2 days for test results.  Someone will contact you regarding abnormal results.    Patient should remain in quarantine until they have received Covid results.  If negative you may resume normal activities (go back to work/school) while practicing hand hygiene, social distance, and mask wearing.  If positive, patient should remain in quarantine for 10 days from symptom onset AND greater than 72 hours after symptoms resolution (absence of fever without the use of fever-reducing medication and  improvement in respiratory symptoms), whichever is longer Get plenty of rest and push fluids Use OTC zyrtec for nasal congestion, runny nose, and/or sore throat Use OTC flonase for nasal congestion and runny nose Use medications daily for symptom relief Use OTC medications like ibuprofen or tylenol as needed fever or pain Call or go to the ED if you have any new or worsening symptoms such as fever, worsening cough, shortness of breath, chest tightness, chest pain, turning blue, changes in mental status.  Reviewed expectations re: course of current medical issues. Questions answered. Outlined signs and symptoms indicating need for more acute intervention. Patient verbalized understanding. After Visit Summary given.         Faustino Congress, NP 09/26/19 [phone]

## 2019-09-26 NOTE — Discharge Instructions (Addendum)
Your COVID test is pending.  You should self quarantine until the test result is back.    Take Tylenol as needed for fever or discomfort.  Rest and keep yourself hydrated.    Go to the emergency department if you develop shortness of breath, severe diarrhea, high fever not relieved by Tylenol or ibuprofen, or other concerning symptoms.    Your rapid strep test is negative.  A throat culture is pending; we will call you if it is positive requiring treatment.    

## 2019-09-26 NOTE — ED Triage Notes (Signed)
C/o sore throat since Friday. 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 Requesting strep testing as well as covid testing.

## 2019-09-28 ENCOUNTER — Encounter: Payer: Self-pay | Admitting: Internal Medicine

## 2019-09-28 LAB — CULTURE, GROUP A STREP (THRC)

## 2019-09-29 ENCOUNTER — Other Ambulatory Visit: Payer: Self-pay

## 2019-09-29 ENCOUNTER — Ambulatory Visit (INDEPENDENT_AMBULATORY_CARE_PROVIDER_SITE_OTHER)
Admission: RE | Admit: 2019-09-29 | Discharge: 2019-09-29 | Disposition: A | Discharge status: Home / Self Care | Payer: BC Managed Care – PPO | Source: Ambulatory Visit

## 2019-09-29 DIAGNOSIS — H9202 Otalgia, left ear: Secondary | ICD-10-CM | POA: Diagnosis not present

## 2019-09-29 NOTE — Discharge Instructions (Signed)
Come to the Urgent Care to be seen in person or go to see your primary care provider since your ear pain is not improving.

## 2019-09-29 NOTE — ED Provider Notes (Signed)
Virtual Visit via Video Note:  Monica Cantu  initiated request for Telemedicine visit with [facility] Urgent Care team. I connected with Monica Cantu  on 09/29/2019 at 9:11 AM  for a synchronized telemedicine visit using a video enabled HIPPA compliant telemedicine application. I verified that I am speaking with Monica Cantu  using two identifiers. Mickie Bail, NP  was physically located in a [facility] Urgent care site and Odean L Ocampo was located at a different location.   The limitations of evaluation and management by telemedicine as well as the availability of in-person appointments were discussed. Patient was informed that she  may incur a bill ( including co-pay) for this virtual visit encounter. Monica Cantu  expressed understanding and gave verbal consent to proceed with virtual visit.     History of Present Illness:Monica Cantu  is a 34 y.o. female presents for evaluation of left ear pain x 8 days.  She was seen in the [facility] UC on 09/26/2019; treated with Zithromax.  She states her ear pain is worse but her other symptoms have improved or resolved.  She is still have a sore throat at night.  She denies fever, chills, cough, shortness of breath, rash, vomiting, diarrhea, or other symptoms.     Allergies  Allergen Reactions  . Amoxicillin-Pot Clavulanate Diarrhea, Other (See Comments) and Nausea Only     Past Medical History:  Diagnosis Date  . Allergy   . Asthma   . Hypertension   . Multiple thyroid nodules    per patient   . PONV (postoperative nausea and vomiting)    with D+ C and tonsillectomy   . Pre-diabetes      Social History   Tobacco Use  . Smoking status: Never Smoker  . Smokeless tobacco: Never Used  Vaping Use  . Vaping Use: Never used  Substance Use Topics  . Alcohol use: No    Alcohol/week: 0.0 standard drinks  . Drug use: No   ROS: as stated in HPI.  All other systems reviewed and negative.       Observations/Objective: Physical Exam  VITALS: Patient denies fever. GENERAL: Alert, appears well and in no acute distress. HEENT: Atraumatic. Oral mucosa appears moist. NECK: Normal movements of the head and neck. CARDIOPULMONARY: No increased WOB. Speaking in clear sentences. I:E ratio WNL.  MS: Moves all visible extremities without noticeable abnormality. PSYCH: Pleasant and cooperative, well-groomed. Speech normal rate and rhythm. Affect is appropriate. Insight and judgement are appropriate. Attention is focused, linear, and appropriate.  NEURO: CN grossly intact. Oriented as arrived to appointment on time with no prompting. Moves both UE equally.  SKIN: No obvious lesions, wounds, erythema, or cyanosis noted on face or hands.   Assessment and Plan:    ICD-10-CM   1. Otalgia, left  H92.02        Follow Up Instructions: Discussed with patient that, since her ear pain is not improving and has gotten worse, she will need to be seen in person to have this evaluated.  Discussed limitation of video visit for diagnosing ear pain.  Patient states she will not be able to come for in person visit today.  Instructed her to take Tylenol or ibuprofen as needed for her discomfort and to come in or see her PCP when she is able.  Patient agrees to plan of care.    I discussed the assessment and treatment plan with the patient. The patient was provided an opportunity  to ask questions and all were answered. The patient agreed with the plan and demonstrated an understanding of the instructions.   The patient was advised to call back or seek an in-person evaluation if the symptoms worsen or if the condition fails to improve as anticipated.      Sharion Balloon, NP  09/29/2019 9:11 AM         Sharion Balloon, NP 09/29/19 [phone]

## 2019-10-03 ENCOUNTER — Other Ambulatory Visit: Payer: Self-pay | Admitting: Surgery

## 2019-10-03 DIAGNOSIS — E041 Nontoxic single thyroid nodule: Secondary | ICD-10-CM

## 2019-10-04 ENCOUNTER — Encounter (HOSPITAL_COMMUNITY): Payer: BC Managed Care – PPO

## 2019-10-05 ENCOUNTER — Inpatient Hospital Stay: Admission: RE | Admit: 2019-10-05 | Payer: BC Managed Care – PPO | Source: Ambulatory Visit

## 2019-10-11 ENCOUNTER — Other Ambulatory Visit (HOSPITAL_COMMUNITY): Payer: BC Managed Care – PPO

## 2019-10-11 ENCOUNTER — Ambulatory Visit: Payer: BC Managed Care – PPO | Admitting: Internal Medicine

## 2019-10-14 ENCOUNTER — Ambulatory Visit: Admit: 2019-10-14 | Payer: BC Managed Care – PPO | Admitting: Surgery

## 2019-10-14 SURGERY — THYROIDECTOMY
Anesthesia: General

## 2019-10-18 ENCOUNTER — Other Ambulatory Visit: Payer: BC Managed Care – PPO

## 2019-11-19 DIAGNOSIS — Z20822 Contact with and (suspected) exposure to covid-19: Secondary | ICD-10-CM | POA: Diagnosis not present

## 2019-11-19 DIAGNOSIS — Z03818 Encounter for observation for suspected exposure to other biological agents ruled out: Secondary | ICD-10-CM | POA: Diagnosis not present

## 2019-11-22 ENCOUNTER — Ambulatory Visit: Payer: BC Managed Care – PPO | Admitting: Internal Medicine

## 2019-12-06 ENCOUNTER — Ambulatory Visit: Payer: BC Managed Care – PPO | Admitting: Internal Medicine

## 2019-12-26 ENCOUNTER — Encounter (HOSPITAL_COMMUNITY): Payer: Self-pay

## 2019-12-26 ENCOUNTER — Other Ambulatory Visit: Payer: Self-pay

## 2019-12-26 ENCOUNTER — Ambulatory Visit (HOSPITAL_COMMUNITY)
Admission: EM | Admit: 2019-12-26 | Discharge: 2019-12-26 | Disposition: A | Discharge status: Home / Self Care | Payer: BC Managed Care – PPO | Attending: Internal Medicine | Admitting: Internal Medicine

## 2019-12-26 DIAGNOSIS — Z7951 Long term (current) use of inhaled steroids: Secondary | ICD-10-CM | POA: Diagnosis not present

## 2019-12-26 DIAGNOSIS — B9789 Other viral agents as the cause of diseases classified elsewhere: Secondary | ICD-10-CM | POA: Diagnosis not present

## 2019-12-26 DIAGNOSIS — E042 Nontoxic multinodular goiter: Secondary | ICD-10-CM | POA: Insufficient documentation

## 2019-12-26 DIAGNOSIS — I10 Essential (primary) hypertension: Secondary | ICD-10-CM | POA: Diagnosis not present

## 2019-12-26 DIAGNOSIS — J45909 Unspecified asthma, uncomplicated: Secondary | ICD-10-CM | POA: Diagnosis not present

## 2019-12-26 DIAGNOSIS — Z79899 Other long term (current) drug therapy: Secondary | ICD-10-CM | POA: Insufficient documentation

## 2019-12-26 DIAGNOSIS — J029 Acute pharyngitis, unspecified: Secondary | ICD-10-CM | POA: Insufficient documentation

## 2019-12-26 DIAGNOSIS — Z6841 Body Mass Index (BMI) 40.0 and over, adult: Secondary | ICD-10-CM | POA: Insufficient documentation

## 2019-12-26 DIAGNOSIS — R7303 Prediabetes: Secondary | ICD-10-CM | POA: Diagnosis not present

## 2019-12-26 DIAGNOSIS — Z20822 Contact with and (suspected) exposure to covid-19: Secondary | ICD-10-CM | POA: Insufficient documentation

## 2019-12-26 LAB — POCT RAPID STREP A, ED / UC: Streptococcus, Group A Screen (Direct): NEGATIVE

## 2019-12-26 MED ORDER — FLUTICASONE PROPIONATE 50 MCG/ACT NA SUSP
1.0000 | Freq: Every day | NASAL | 2 refills | Status: DC
Start: 2019-12-26 — End: 2020-11-12

## 2019-12-26 MED ORDER — IBUPROFEN 600 MG PO TABS
600.0000 mg | ORAL_TABLET | Freq: Four times a day (QID) | ORAL | 0 refills | Status: DC | PRN
Start: 2019-12-26 — End: 2023-07-09

## 2019-12-26 NOTE — ED Triage Notes (Signed)
Pt c/o sore throat, dysphagia, Hax2 days. Pt states it's painful when she turn her neck.

## 2019-12-27 ENCOUNTER — Ambulatory Visit (INDEPENDENT_AMBULATORY_CARE_PROVIDER_SITE_OTHER): Payer: BC Managed Care – PPO | Admitting: Medical

## 2019-12-27 VITALS — BP 155/74 | HR 100 | Resp 18 | Ht 64.0 in | Wt 326.0 lb

## 2019-12-27 DIAGNOSIS — R300 Dysuria: Secondary | ICD-10-CM | POA: Diagnosis not present

## 2019-12-27 LAB — POC URINALSYSI DIPSTICK (AUTOMATED)
Bilirubin, UA: NEGATIVE
Blood, UA: POSITIVE
Glucose, UA: NEGATIVE
Ketones, UA: NEGATIVE
Nitrite, UA: NEGATIVE
Protein, UA: POSITIVE — AB
Spec Grav, UA: >=1.03 — AB (ref 1.010–1.025)
Urobilinogen, UA: 0.2 E.U./dL
pH, UA: 6 (ref 5.0–8.0)

## 2019-12-27 LAB — SARS CORONAVIRUS 2 (TAT 6-24 HRS): SARS Coronavirus 2: NEGATIVE

## 2019-12-27 MED ORDER — NITROFURANTOIN MONOHYD MACRO 100 MG PO CAPS: 100.0000 mg | ORAL_CAPSULE | Freq: Two times a day (BID) | ORAL | 0 refills | Status: DC

## 2019-12-27 MED ORDER — PHENAZOPYRIDINE HCL 200 MG PO TABS
200.0000 mg | ORAL_TABLET | Freq: Three times a day (TID) | ORAL | 0 refills | Status: DC | PRN
Start: 2019-12-27 — End: 2020-11-12

## 2019-12-27 NOTE — Progress Notes (Signed)
   Subjective:    Patient ID: Monica Cantu, female    DOB: 22-Mar-1986, 34 y.o.   MRN: [id number]  HPI  Pt in today reporting urinary symptoms for about one week. She tried hydration and cranberry juice.  Dysuria- yes. Frequent urination-yes Hesitancy-yes Suprapubic pressure-ye Fever-no chills-no Nausea-no Vomiting-no CVA pain-rt side. History of UTI- about 2 times a year. Gross hematuria-concentrated but no blood  Lmp- past Tuesday.  Review of Systems  Constitutional: Negative for chills, fatigue and fever.  Respiratory: Negative for chest tightness, shortness of breath and wheezing.   Cardiovascular: Negative for chest pain and palpitations.  Gastrointestinal: Negative for abdominal pain.  Genitourinary: Positive for dysuria, frequency and urgency. Negative for hematuria and vaginal pain.  Musculoskeletal: Negative for gait problem.       Rt cva area pain  Skin: Negative for rash.  Neurological: Negative for dizziness, speech difficulty, weakness, light-headedness and headaches.  Hematological: Negative for adenopathy. Does not bruise/bleed easily.       Objective:   Physical Exam  General- No acute distress. Pleasant patient. Neck- Full range of motion, no jvd Lungs- Clear, even and unlabored. Heart- regular rate and rhythm. Neurologic- CNII- XII grossly intact.  Back- faint rt cva pain. Abdomen- faint suprapubic tender,  +bs, no rebound or guarding and no organomegaly.      Assessment & Plan:  You appear to have a urinary tract infection. I am prescribing macrobid antibiotic for the probable infection. Hydrate well. I am sending out a urine culture. During the interim if your signs and symptoms worsen rather than improving please notify us. We will notify your when the culture results are back.   For urinary pain will rx pyridium  Follow up in 7 days or as needed.

## 2019-12-27 NOTE — Patient Instructions (Signed)
You appear to have a urinary tract infection. I am prescribing macrobid antibiotic for the probable infection. Hydrate well. I am sending out a urine culture. During the interim if your signs and symptoms worsen rather than improving please notify us. We will notify your when the culture results are back.  For urinary pain will rx pyridium  Follow up in 7 days or as needed. 

## 2019-12-28 LAB — URINE CULTURE
MICRO NUMBER:: 10976490
SPECIMEN QUALITY:: ADEQUATE

## 2019-12-28 LAB — CULTURE, GROUP A STREP (THRC)

## 2019-12-28 NOTE — ED Provider Notes (Signed)
[facility]    CSN: [id number] Arrival date & time: 12/26/19  1945      History   Chief Complaint Chief Complaint  Patient presents with  . Sore Throat    HPI Monica Cantu is a 34 y.o. female comes to the urgent care with complaints of sore throat, difficulty swallowing and headache of 2 days duration.  Pain is of moderate severity and is currently 8 out of 10.  Pain is aggravated by turning and neck.  No fever, chills or body aches.  No loss of taste or smell.  Patient is vaccinated against COVID-19 infection.  No shortness of breath, chest tightness or wheezing.  No numbness or tingling in the upper extremities.   HPI  Past Medical History:  Diagnosis Date  . Allergy   . Asthma   . Hypertension   . Multiple thyroid nodules    per patient   . PONV (postoperative nausea and vomiting)    with D+ C and tonsillectomy   . Pre-diabetes     Patient Active Problem List   Diagnosis Date Noted  . S/P laparoscopic sleeve gastrectomyNov 2018 03/02/2017  . Low back pain radiating to left leg 09/30/2016  . Multinodular goiter 05/27/2016  . Annual physical exam 02/22/2016  . PCP NOTES >>>>>>>>>>>>>>>>>>>>> 05/31/2015  . Asthma 05/11/2015  . Morbid obesity (HCC) 04/24/2015  . Elevated blood pressure 04/24/2015    Past Surgical History:  Procedure Laterality Date  . DILATION AND CURETTAGE OF UTERUS    . LAPAROSCOPIC GASTRIC SLEEVE RESECTION N/A 03/02/2017   Procedure: LAPAROSCOPIC GASTRIC SLEEVE RESECTION, UPPER ENDO;  Surgeon: Luretha Murphy, MD;  Location: [facility];  Service: General;  Laterality: N/A;  . TONSILLECTOMY AND ADENOIDECTOMY      OB History    Gravida  4   Para  2   Term  2   Preterm  0   AB  2   Living  2     SAB  2   TAB      Ectopic  0   Multiple      Live Births  2            Home Medications    Prior to Admission medications   Medication Sig Start Date End Date Taking? Authorizing Provider  albuterol (VENTOLIN  HFA) 108 (90 Base) MCG/ACT inhaler Inhale 1-2 puffs into the lungs every 4 (four) hours as needed for wheezing or shortness of breath. 05/18/18   Wanda Plump, MD  cetirizine (ZYRTEC) 10 MG tablet Take 1 tablet (10 mg total) by mouth at bedtime. 08/11/16   Wanda Plump, MD  cetirizine-pseudoephedrine (ZYRTEC-D) 5-120 MG tablet Take 1 tablet by mouth daily. 09/26/19   Moshe Cipro, NP  FLOVENT [facility] 110 MCG/ACT inhaler  05/18/18   [provider]  fluticasone (FLONASE) 50 MCG/ACT nasal spray Place 1 spray into both nostrils daily. 12/26/19   Merrilee Jansky, MD  ibuprofen (ADVIL) 600 MG tablet Take 1 tablet (600 mg total) by mouth every 6 (six) hours as needed. 12/26/19   Kamry Faraci, Britta Mccreedy, MD  nitrofurantoin, macrocrystal-monohydrate, (MACROBID) 100 MG capsule Take 1 capsule (100 mg total) by mouth 2 (two) times daily. 12/27/19   Saguier, Ramon Dredge, PA-C  pantoprazole (PROTONIX) 40 MG tablet Take 40 mg by mouth daily.    [provider]  phenazopyridine (PYRIDIUM) 200 MG tablet Take 1 tablet (200 mg total) by mouth 3 (three) times daily as needed for pain. 12/27/19  Saguier, Ramon Dredge, PA-C  phentermine 37.5 MG capsule Take 1 capsule by mouth daily. 02/16/19 12/26/19  [provider]    Family History Family History  Problem Relation Age of Onset  . Hyperlipidemia Father   . Diabetes Maternal Grandmother   . Breast cancer Maternal Grandmother   . Heart disease Paternal Grandmother   . Hypertension Paternal Grandmother   . Stroke Other   . Colon cancer Neg Hx     Social History Social History   Tobacco Use  . Smoking status: Never Smoker  . Smokeless tobacco: Never Used  Vaping Use  . Vaping Use: Never used  Substance Use Topics  . Alcohol use: No    Alcohol/week: 0.0 standard drinks  . Drug use: No     Allergies   Amoxicillin-pot clavulanate   Review of Systems Review of Systems as per HPI   Physical Exam Triage Vital Signs ED Triage Vitals  Enc  Vitals Group     BP 12/26/19 2057 (!) 142/79     Pulse Rate 12/26/19 2057 87     Resp 12/26/19 2057 18     Temp 12/26/19 2057 99.2 F (37.3 C)     Temp Source 12/26/19 2057 Oral     SpO2 12/26/19 2057 100 %     Weight 12/26/19 2059 (!) 325 lb (147.4 kg)     Height 12/26/19 2059 5\' 4"  (1.626 m)     Head Circumference --      Peak Flow --      Pain Score 12/26/19 2059 8     Pain Loc --      Pain Edu? --      Excl. in GC? --    No data found.  Updated Vital Signs BP (!) 142/79   Pulse 87   Temp 99.2 F (37.3 C) (Oral)   Resp 18   Ht 5\' 4"  (1.626 m)   Wt (!) 147.4 kg   LMP 12/20/2019   SpO2 100%   BMI 55.79 kg/m   Visual Acuity Right Eye Distance:   Left Eye Distance:   Bilateral Distance:    Right Eye Near:   Left Eye Near:    Bilateral Near:     Physical Exam Vitals and nursing note reviewed.  Constitutional:      General: She is not in acute distress.    Appearance: She is ill-appearing.  HENT:     Right Ear: Tympanic membrane normal.     Left Ear: Tympanic membrane normal.     Mouth/Throat:     Mouth: Mucous membranes are moist.     Pharynx: Posterior oropharyngeal erythema present.     Tonsils: 1+ on the right. 1+ on the left.  Cardiovascular:     Rate and Rhythm: Normal rate and regular rhythm.     Heart sounds: Normal heart sounds. No murmur heard.  No friction rub.  Pulmonary:     Effort: Pulmonary effort is normal.  Abdominal:     Palpations: Abdomen is soft.  Skin:    Capillary Refill: Capillary refill takes less than 2 seconds.  Neurological:     Mental Status: She is alert.      UC Treatments / Results  Labs (all labs ordered are listed, but only abnormal results are displayed) Labs Reviewed  SARS CORONAVIRUS 2 (TAT 6-24 HRS)  CULTURE, GROUP A STREP [facility])  POCT RAPID STREP A, ED / UC    EKG   Radiology No results found.  Procedures Procedures (  including critical care time)  Medications Ordered in UC Medications - No data  to display  Initial Impression / Assessment and Plan / UC Course  I have reviewed the triage vital signs and the nursing notes.  Pertinent labs & imaging results that were available during my care of the patient were reviewed by me and considered in my medical decision making (see chart for details).     1.  Viral pharyngitis: Ibuprofen as needed for generalized body aches Fluticasone nasal spray Rapid strep is negative Throat culture sent COVID-19 PCR test has been sent Please quarantine until COVID-19 test results available Return precautions given If patient symptoms worsen she is advised to return to urgent care to be reevaluated. Final Clinical Impressions(s) / UC Diagnoses   Final diagnoses:  Viral pharyngitis   Discharge Instructions   None    ED Prescriptions    Medication Sig Dispense Auth. Provider   fluticasone (FLONASE) 50 MCG/ACT nasal spray Place 1 spray into both nostrils daily. 16 g Merrilee Jansky, MD   ibuprofen (ADVIL) 600 MG tablet Take 1 tablet (600 mg total) by mouth every 6 (six) hours as needed. 30 tablet Keygan Dumond, Britta Mccreedy, MD     PDMP not reviewed this encounter.   Merrilee Jansky, MD 12/28/19 [phone]

## 2019-12-29 ENCOUNTER — Telehealth (HOSPITAL_COMMUNITY): Payer: Self-pay | Admitting: Emergency Medicine

## 2019-12-29 MED ORDER — AZITHROMYCIN 250 MG PO TABS: 250.0000 mg | ORAL_TABLET | Freq: Every day | ORAL | 0 refills | Status: DC

## 2020-02-21 DIAGNOSIS — K219 Gastro-esophageal reflux disease without esophagitis: Secondary | ICD-10-CM | POA: Diagnosis not present

## 2020-02-21 DIAGNOSIS — L509 Urticaria, unspecified: Secondary | ICD-10-CM | POA: Diagnosis not present

## 2020-02-21 DIAGNOSIS — J4530 Mild persistent asthma, uncomplicated: Secondary | ICD-10-CM | POA: Diagnosis not present

## 2020-02-21 DIAGNOSIS — E6609 Other obesity due to excess calories: Secondary | ICD-10-CM | POA: Diagnosis not present

## 2020-03-19 ENCOUNTER — Encounter (HOSPITAL_COMMUNITY): Payer: Self-pay

## 2020-03-19 ENCOUNTER — Ambulatory Visit (HOSPITAL_COMMUNITY)
Admission: EM | Admit: 2020-03-19 | Discharge: 2020-03-19 | Disposition: A | Discharge status: Home / Self Care | Payer: BC Managed Care – PPO | Attending: Family Medicine | Admitting: Family Medicine

## 2020-03-19 ENCOUNTER — Other Ambulatory Visit: Payer: Self-pay

## 2020-03-19 DIAGNOSIS — J069 Acute upper respiratory infection, unspecified: Secondary | ICD-10-CM

## 2020-03-19 DIAGNOSIS — Z20822 Contact with and (suspected) exposure to covid-19: Secondary | ICD-10-CM | POA: Insufficient documentation

## 2020-03-19 LAB — POCT RAPID STREP A, ED / UC: Streptococcus, Group A Screen (Direct): NEGATIVE

## 2020-03-19 LAB — SARS CORONAVIRUS 2 (TAT 6-24 HRS): SARS Coronavirus 2: NEGATIVE

## 2020-03-19 NOTE — ED Triage Notes (Signed)
Pt presents with non productive cough, sore throat, headache, and congestion X 2 days.

## 2020-03-19 NOTE — ED Provider Notes (Signed)
[facility]    CSN: [id number] Arrival date & time: 03/19/20  8756      History   Chief Complaint Chief Complaint  Patient presents with   Sore Throat   Cough   Headache    HPI Monica Cantu is a 34 y.o. female.   Here today with 2 day history of sore throat, hoarseness, congestion, cough. Denies fever, chills, body aches, rashes, abdominal pain, N/V/D. So far taking her standard allergy regimen, tylenol, OTC cough and cold medications with mild relief. Family members also sick with similar sxs. UTD on vaccines.      Past Medical History:  Diagnosis Date   Allergy    Asthma    Hypertension    Multiple thyroid nodules    per patient    PONV (postoperative nausea and vomiting)    with D+ C and tonsillectomy    Pre-diabetes     Patient Active Problem List   Diagnosis Date Noted   S/P laparoscopic sleeve gastrectomyNov 2018 03/02/2017   Low back pain radiating to left leg 09/30/2016   Multinodular goiter 05/27/2016   Annual physical exam 02/22/2016   PCP NOTES >>>>>>>>>>>>>>>>>>>>> 05/31/2015   Asthma 05/11/2015   Morbid obesity (HCC) 04/24/2015   Elevated blood pressure 04/24/2015    Past Surgical History:  Procedure Laterality Date   DILATION AND CURETTAGE OF UTERUS     LAPAROSCOPIC GASTRIC SLEEVE RESECTION N/A 03/02/2017   Procedure: LAPAROSCOPIC GASTRIC SLEEVE RESECTION, UPPER ENDO;  Surgeon: Luretha Murphy, MD;  Location: [facility];  Service: General;  Laterality: N/A;   TONSILLECTOMY AND ADENOIDECTOMY      OB History    Gravida  4   Para  2   Term  2   Preterm  0   AB  2   Living  2     SAB  2   IAB      Ectopic  0   Multiple      Live Births  2            Home Medications    Prior to Admission medications   Medication Sig Start Date End Date Taking? Authorizing Provider  albuterol (VENTOLIN HFA) 108 (90 Base) MCG/ACT inhaler Inhale 1-2 puffs into the lungs every 4 (four) hours as needed  for wheezing or shortness of breath. 05/18/18   Wanda Plump, MD  azithromycin (ZITHROMAX) 250 MG tablet Take 1 tablet (250 mg total) by mouth daily. Take first 2 tablets together, then 1 every day until finished. 12/29/19   Merrilee Jansky, MD  cetirizine (ZYRTEC) 10 MG tablet Take 1 tablet (10 mg total) by mouth at bedtime. 08/11/16   Wanda Plump, MD  cetirizine-pseudoephedrine (ZYRTEC-D) 5-120 MG tablet Take 1 tablet by mouth daily. 09/26/19   Moshe Cipro, NP  FLOVENT [facility] 110 MCG/ACT inhaler  05/18/18   [provider]  fluticasone (FLONASE) 50 MCG/ACT nasal spray Place 1 spray into both nostrils daily. 12/26/19   Merrilee Jansky, MD  ibuprofen (ADVIL) 600 MG tablet Take 1 tablet (600 mg total) by mouth every 6 (six) hours as needed. 12/26/19   Lamptey, Britta Mccreedy, MD  nitrofurantoin, macrocrystal-monohydrate, (MACROBID) 100 MG capsule Take 1 capsule (100 mg total) by mouth 2 (two) times daily. 12/27/19   Saguier, Ramon Dredge, PA-C  pantoprazole (PROTONIX) 40 MG tablet Take 40 mg by mouth daily.    [provider]  phenazopyridine (PYRIDIUM) 200 MG tablet Take 1 tablet (200 mg total) by  mouth 3 (three) times daily as needed for pain. 12/27/19   Saguier, Ramon Dredge, PA-C  phentermine 37.5 MG capsule Take 1 capsule by mouth daily. 02/16/19 12/26/19  [provider]    Family History Family History  Problem Relation Age of Onset   Hyperlipidemia Father    Diabetes Maternal Grandmother    Breast cancer Maternal Grandmother    Heart disease Paternal Grandmother    Hypertension Paternal Grandmother    Stroke Other    Colon cancer Neg Hx     Social History Social History   Tobacco Use   Smoking status: Never Smoker   Smokeless tobacco: Never Used  Building services engineer Use: Never used  Substance Use Topics   Alcohol use: No    Alcohol/week: 0.0 standard drinks   Drug use: No     Allergies   Amoxicillin-pot clavulanate   Review of Systems Review of  Systems PER HPI   Physical Exam Triage Vital Signs ED Triage Vitals  Enc Vitals Group     BP 03/19/20 0853 128/88     Pulse Rate 03/19/20 0853 79     Resp 03/19/20 0853 17     Temp 03/19/20 0853 98.5 F (36.9 C)     Temp Source 03/19/20 0853 Oral     SpO2 03/19/20 0853 100 %     Weight --      Height --      Head Circumference --      Peak Flow --      Pain Score 03/19/20 0854 5     Pain Loc --      Pain Edu? --      Excl. in GC? --    No data found.  Updated Vital Signs BP 128/88 (BP Location: Right Arm)    Pulse 79    Temp 98.5 F (36.9 C) (Oral)    Resp 17    LMP 03/06/2020    SpO2 100%   Visual Acuity Right Eye Distance:   Left Eye Distance:   Bilateral Distance:    Right Eye Near:   Left Eye Near:    Bilateral Near:     Physical Exam Vitals and nursing note reviewed.  Constitutional:      Appearance: Normal appearance. She is not ill-appearing.  HENT:     Head: Atraumatic.     Right Ear: Tympanic membrane normal.     Left Ear: Tympanic membrane normal.     Nose: Rhinorrhea present.     Mouth/Throat:     Mouth: Mucous membranes are moist.     Pharynx: Posterior oropharyngeal erythema (no tonsillar edema b/l) present. No oropharyngeal exudate.  Eyes:     Extraocular Movements: Extraocular movements intact.     Conjunctiva/sclera: Conjunctivae normal.  Cardiovascular:     Rate and Rhythm: Normal rate and regular rhythm.     Heart sounds: Normal heart sounds.  Pulmonary:     Effort: Pulmonary effort is normal.     Breath sounds: Normal breath sounds.  Abdominal:     General: Bowel sounds are normal. There is no distension.     Palpations: Abdomen is soft.     Tenderness: There is no abdominal tenderness. There is no guarding.  Musculoskeletal:        General: Normal range of motion.     Cervical back: Normal range of motion and neck supple.  Skin:    General: Skin is warm and dry.  Neurological:     Mental  Status: She is alert and oriented to  person, place, and time.  Psychiatric:        Mood and Affect: Mood normal.        Thought Content: Thought content normal.        Judgment: Judgment normal.      UC Treatments / Results  Labs (all labs ordered are listed, but only abnormal results are displayed) Labs Reviewed  SARS CORONAVIRUS 2 (TAT 6-24 HRS)  CULTURE, GROUP A STREP [facility])  POCT RAPID STREP A, ED / UC    EKG   Radiology No results found.  Procedures Procedures (including critical care time)  Medications Ordered in UC Medications - No data to display  Initial Impression / Assessment and Plan / UC Course  I have reviewed the triage vital signs and the nursing notes.  Pertinent labs & imaging results that were available during my care of the patient were reviewed by me and considered in my medical decision making (see chart for details).     Rapid strep per patient request, this was negative, culture sent. COVID pcr pending. Work note given, discussed supportive care and OTC medications for sxs. Return if worsening.   Final Clinical Impressions(s) / UC Diagnoses   Final diagnoses:  Viral URI with cough   Discharge Instructions   None    ED Prescriptions    None     PDMP not reviewed this encounter.   Particia Nearing, [place] 03/19/20 1019

## 2020-03-21 LAB — CULTURE, GROUP A STREP (THRC)

## 2020-03-27 DIAGNOSIS — J309 Allergic rhinitis, unspecified: Secondary | ICD-10-CM | POA: Diagnosis not present

## 2020-03-27 DIAGNOSIS — H1045 Other chronic allergic conjunctivitis: Secondary | ICD-10-CM | POA: Diagnosis not present

## 2020-03-27 DIAGNOSIS — L501 Idiopathic urticaria: Secondary | ICD-10-CM | POA: Diagnosis not present

## 2020-04-06 DIAGNOSIS — Z20828 Contact with and (suspected) exposure to other viral communicable diseases: Secondary | ICD-10-CM | POA: Diagnosis not present

## 2020-04-16 DIAGNOSIS — R509 Fever, unspecified: Secondary | ICD-10-CM | POA: Diagnosis not present

## 2020-05-15 DIAGNOSIS — S76312A Strain of muscle, fascia and tendon of the posterior muscle group at thigh level, left thigh, initial encounter: Secondary | ICD-10-CM | POA: Diagnosis not present

## 2020-05-16 ENCOUNTER — Other Ambulatory Visit: Payer: Self-pay | Admitting: Orthopedic Surgery

## 2020-05-16 DIAGNOSIS — R52 Pain, unspecified: Secondary | ICD-10-CM

## 2020-06-04 ENCOUNTER — Ambulatory Visit
Admission: RE | Admit: 2020-06-04 | Discharge: 2020-06-04 | Disposition: A | Discharge status: Home / Self Care | Payer: BC Managed Care – PPO | Source: Ambulatory Visit | Attending: Orthopedic Surgery | Admitting: Orthopedic Surgery

## 2020-06-04 ENCOUNTER — Other Ambulatory Visit: Payer: Self-pay

## 2020-06-04 DIAGNOSIS — S76312A Strain of muscle, fascia and tendon of the posterior muscle group at thigh level, left thigh, initial encounter: Secondary | ICD-10-CM | POA: Diagnosis not present

## 2020-06-04 DIAGNOSIS — R52 Pain, unspecified: Secondary | ICD-10-CM

## 2020-06-15 ENCOUNTER — Encounter: Payer: Self-pay | Admitting: Internal Medicine

## 2020-07-09 DIAGNOSIS — S76312D Strain of muscle, fascia and tendon of the posterior muscle group at thigh level, left thigh, subsequent encounter: Secondary | ICD-10-CM | POA: Diagnosis not present

## 2020-07-30 DIAGNOSIS — M2550 Pain in unspecified joint: Secondary | ICD-10-CM | POA: Diagnosis not present

## 2020-07-30 DIAGNOSIS — R252 Cramp and spasm: Secondary | ICD-10-CM | POA: Diagnosis not present

## 2020-08-17 DIAGNOSIS — N914 Secondary oligomenorrhea: Secondary | ICD-10-CM | POA: Diagnosis not present

## 2020-08-17 DIAGNOSIS — R635 Abnormal weight gain: Secondary | ICD-10-CM | POA: Diagnosis not present

## 2020-08-17 DIAGNOSIS — E8881 Metabolic syndrome: Secondary | ICD-10-CM | POA: Diagnosis not present

## 2020-08-17 DIAGNOSIS — Z79899 Other long term (current) drug therapy: Secondary | ICD-10-CM | POA: Diagnosis not present

## 2020-08-17 DIAGNOSIS — Z131 Encounter for screening for diabetes mellitus: Secondary | ICD-10-CM | POA: Diagnosis not present

## 2020-08-17 DIAGNOSIS — E559 Vitamin D deficiency, unspecified: Secondary | ICD-10-CM | POA: Diagnosis not present

## 2020-08-17 DIAGNOSIS — E7800 Pure hypercholesterolemia, unspecified: Secondary | ICD-10-CM | POA: Diagnosis not present

## 2020-08-17 DIAGNOSIS — R0602 Shortness of breath: Secondary | ICD-10-CM | POA: Diagnosis not present

## 2020-08-17 DIAGNOSIS — Z1331 Encounter for screening for depression: Secondary | ICD-10-CM | POA: Diagnosis not present

## 2020-09-25 ENCOUNTER — Encounter (HOSPITAL_COMMUNITY): Payer: Self-pay | Admitting: *Deleted

## 2020-09-26 ENCOUNTER — Other Ambulatory Visit: Payer: Self-pay

## 2020-09-26 ENCOUNTER — Encounter (HOSPITAL_BASED_OUTPATIENT_CLINIC_OR_DEPARTMENT_OTHER): Payer: Self-pay

## 2020-09-26 ENCOUNTER — Emergency Department (HOSPITAL_BASED_OUTPATIENT_CLINIC_OR_DEPARTMENT_OTHER)
Admission: EM | Admit: 2020-09-26 | Discharge: 2020-09-26 | Disposition: A | Discharge status: Home / Self Care | Payer: BC Managed Care – PPO | Attending: Emergency Medicine | Admitting: Emergency Medicine

## 2020-09-26 DIAGNOSIS — Z7951 Long term (current) use of inhaled steroids: Secondary | ICD-10-CM | POA: Diagnosis not present

## 2020-09-26 DIAGNOSIS — I10 Essential (primary) hypertension: Secondary | ICD-10-CM | POA: Insufficient documentation

## 2020-09-26 DIAGNOSIS — M545 Low back pain, unspecified: Secondary | ICD-10-CM | POA: Insufficient documentation

## 2020-09-26 DIAGNOSIS — N390 Urinary tract infection, site not specified: Secondary | ICD-10-CM | POA: Diagnosis not present

## 2020-09-26 DIAGNOSIS — J45909 Unspecified asthma, uncomplicated: Secondary | ICD-10-CM | POA: Diagnosis not present

## 2020-09-26 DIAGNOSIS — R300 Dysuria: Secondary | ICD-10-CM | POA: Diagnosis not present

## 2020-09-26 LAB — URINALYSIS, ROUTINE W REFLEX MICROSCOPIC
Bilirubin Urine: NEGATIVE
Glucose, UA: NEGATIVE mg/dL
Ketones, ur: NEGATIVE mg/dL
Nitrite: NEGATIVE
Protein, ur: NEGATIVE mg/dL
Specific Gravity, Urine: >1.03 — ABNORMAL HIGH (ref 1.005–1.030)
pH: 5.5 (ref 5.0–8.0)

## 2020-09-26 LAB — PREGNANCY, URINE: Preg Test, Ur: NEGATIVE

## 2020-09-26 LAB — URINALYSIS, MICROSCOPIC (REFLEX)

## 2020-09-26 MED ORDER — CEPHALEXIN 500 MG PO CAPS: 500.0000 mg | ORAL_CAPSULE | Freq: Three times a day (TID) | ORAL | 0 refills | Status: AC

## 2020-09-26 NOTE — ED Provider Notes (Signed)
[facility] EMERGENCY DEPARTMENT Provider Note   CSN: [id number] Arrival date & time: 09/26/20  1742     History Chief Complaint  Patient presents with   Abdominal Pain    Monica Cantu is a 35 y.o. female presenting for evaluation of dysuria, urinary frequency, lower abdominal pain.  Patient states her symptoms began yesterday.  She reports that she is urinating multiple times an hour.  When finishing urination, she has a pressure in her suprapubic abdomen is that she needs to urinate more but not much is coming out.  No blood in her urine.  She reports chills, but no fevers.  She has associated nausea without vomiting.  She has suprapubic/lower abdominal discomfort which radiates to her back bilaterally.  She has a history of UTIs, states this feels similar.  No vaginal discharge.  She is sexually active with 1 female partner, her husband.  They do not use condoms.  She is not concerned for STD.  She took ibuprofen this morning for pain, has not taken anything else. Nothing makes her sxs better.   HPI     Past Medical History:  Diagnosis Date   Allergy    Asthma    Hypertension    Multiple thyroid nodules    per patient    PONV (postoperative nausea and vomiting)    with D+ C and tonsillectomy    Pre-diabetes     Patient Active Problem List   Diagnosis Date Noted   S/P laparoscopic sleeve gastrectomyNov 2018 03/02/2017   Low back pain radiating to left leg 09/30/2016   Multinodular goiter 05/27/2016   Annual physical exam 02/22/2016   PCP NOTES >>>>>>>>>>>>>>>>>>>>> 05/31/2015   Asthma 05/11/2015   Morbid obesity (HCC) 04/24/2015   Elevated blood pressure 04/24/2015    Past Surgical History:  Procedure Laterality Date   DILATION AND CURETTAGE OF UTERUS     LAPAROSCOPIC GASTRIC SLEEVE RESECTION N/A 03/02/2017   Procedure: LAPAROSCOPIC GASTRIC SLEEVE RESECTION, UPPER ENDO;  Surgeon: Luretha Murphy, MD;  Location: [facility];  Service: General;  Laterality:  N/A;   TONSILLECTOMY AND ADENOIDECTOMY       OB History     Gravida  4   Para  2   Term  2   Preterm  0   AB  2   Living  2      SAB  2   IAB      Ectopic  0   Multiple      Live Births  2           Family History  Problem Relation Age of Onset   Hyperlipidemia Father    Diabetes Maternal Grandmother    Breast cancer Maternal Grandmother    Heart disease Paternal Grandmother    Hypertension Paternal Grandmother    Stroke Other    Colon cancer Neg Hx     Social History   Tobacco Use   Smoking status: Never   Smokeless tobacco: Never  Vaping Use   Vaping Use: Never used  Substance Use Topics   Alcohol use: No    Alcohol/week: 0.0 standard drinks   Drug use: No    Home Medications Prior to Admission medications   Medication Sig Start Date End Date Taking? Authorizing Provider  cephALEXin (KEFLEX) 500 MG capsule Take 1 capsule (500 mg total) by mouth 3 (three) times daily for 5 days. 09/26/20 10/01/20 Yes Kitti Mcclish, PA-C  albuterol (VENTOLIN HFA) 108 (90 Base) MCG/ACT inhaler Inhale  1-2 puffs into the lungs every 4 (four) hours as needed for wheezing or shortness of breath. 05/18/18   Wanda Plump, MD  azithromycin (ZITHROMAX) 250 MG tablet Take 1 tablet (250 mg total) by mouth daily. Take first 2 tablets together, then 1 every day until finished. 12/29/19   Merrilee Jansky, MD  cetirizine (ZYRTEC) 10 MG tablet Take 1 tablet (10 mg total) by mouth at bedtime. 08/11/16   Wanda Plump, MD  cetirizine-pseudoephedrine (ZYRTEC-D) 5-120 MG tablet Take 1 tablet by mouth daily. 09/26/19   Moshe Cipro, NP  FLOVENT [facility] 110 MCG/ACT inhaler  05/18/18   [provider]  fluticasone (FLONASE) 50 MCG/ACT nasal spray Place 1 spray into both nostrils daily. 12/26/19   Merrilee Jansky, MD  ibuprofen (ADVIL) 600 MG tablet Take 1 tablet (600 mg total) by mouth every 6 (six) hours as needed. 12/26/19   Lamptey, Britta Mccreedy, MD  nitrofurantoin,  macrocrystal-monohydrate, (MACROBID) 100 MG capsule Take 1 capsule (100 mg total) by mouth 2 (two) times daily. 12/27/19   Saguier, Ramon Dredge, PA-C  pantoprazole (PROTONIX) 40 MG tablet Take 40 mg by mouth daily.    [provider]  phenazopyridine (PYRIDIUM) 200 MG tablet Take 1 tablet (200 mg total) by mouth 3 (three) times daily as needed for pain. 12/27/19   Saguier, Ramon Dredge, PA-C  phentermine 37.5 MG capsule Take 1 capsule by mouth daily. 02/16/19 12/26/19  [provider]    Allergies    Amoxicillin-pot clavulanate  Review of Systems   Review of Systems  Constitutional:  Positive for chills.  Gastrointestinal:  Positive for nausea.  Genitourinary:  Positive for dysuria, frequency and urgency.  All other systems reviewed and are negative.  Physical Exam Updated Vital Signs BP (!) 151/102 (BP Location: Left Arm)   Pulse 94   Temp 99.4 F (37.4 C) (Oral)   Resp 18   Ht 5\' 4"  (1.626 m)   Wt (!) 153.8 kg   LMP 09/12/2020   SpO2 100%   BMI 58.19 kg/m   Physical Exam Vitals and nursing note reviewed.  Constitutional:      General: She is not in acute distress.    Appearance: Normal appearance. She is obese.     Comments: Resting in the bed in no acute distress  HENT:     Head: Normocephalic and atraumatic.  Eyes:     Conjunctiva/sclera: Conjunctivae normal.     Pupils: Pupils are equal, round, and reactive to light.  Cardiovascular:     Rate and Rhythm: Normal rate and regular rhythm.     Pulses: Normal pulses.  Pulmonary:     Effort: Pulmonary effort is normal. No respiratory distress.     Breath sounds: Normal breath sounds. No wheezing.     Comments: Speaking in full sentences.  Clear lung sounds in all fields. Abdominal:     General: There is no distension.     Palpations: Abdomen is soft.     Tenderness: There is abdominal tenderness in the suprapubic area.     Comments: Mild discomfort with palpation of suprapubic abdomen.  No  rigidity, guarding,  distention.  Tenderness to palpation of low back, but no CVA tenderness.  Musculoskeletal:        General: Normal range of motion.     Cervical back: Normal range of motion and neck supple.  Skin:    General: Skin is warm and dry.     Capillary Refill: Capillary refill takes less than 2  seconds.  Neurological:     Mental Status: She is alert and oriented to person, place, and time.  Psychiatric:        Mood and Affect: Mood and affect normal.        Speech: Speech normal.        Behavior: Behavior normal.    ED Results / Procedures / Treatments   Labs (all labs ordered are listed, but only abnormal results are displayed) Labs Reviewed  URINALYSIS, ROUTINE W REFLEX MICROSCOPIC - Abnormal; Notable for the following components:      Result Value   APPearance CLOUDY (*)    Specific Gravity, Urine >1.030 (*)    Hgb urine dipstick SMALL (*)    Leukocytes,Ua SMALL (*)    All other components within normal limits  URINALYSIS, MICROSCOPIC (REFLEX) - Abnormal; Notable for the following components:   Bacteria, UA FEW (*)    All other components within normal limits  PREGNANCY, URINE    EKG None  Radiology No results found.  Procedures Procedures   Medications Ordered in ED Medications - No data to display  ED Course  I have reviewed the triage vital signs and the nursing notes.  Pertinent labs & imaging results that were available during my care of the patient were reviewed by me and considered in my medical decision making (see chart for details).    MDM Rules/Calculators/A&P                          Patient presenting for evaluation of urinary symptoms and lower abdominal discomfort.  On exam, patient appears nontoxic.  She does have suprapubic discomfort and discomfort of the low back.  No CVA tenderness.  Urine obtained from triage interpreted by me, consistent with infection.  Pregnancy is negative.  Patient denies vaginal discharge, I offered pelvic exam as symptoms  can overlap, she declined.  Patient with stable vital signs, no signs of systemic infection or sepsis.  Discussed treatment with antibiotics as well as symptomatic management until antibiotics are working.  Discussed importance of hydration.  Follow-up with PCP as needed.  At this time, patient appears safe for discharge.  Return precautions given.  Patient states she understands and agrees to plan   Final Clinical Impression(s) / ED Diagnoses Final diagnoses:  Urinary tract infection without hematuria, site unspecified    Rx / DC Orders ED Discharge Orders          Ordered    cephALEXin (KEFLEX) 500 MG capsule  3 times daily        09/26/20 1826             Alveria Apley, PA-C 09/26/20 1835    Virgina Norfolk, DO 09/26/20 2306

## 2020-09-26 NOTE — ED Triage Notes (Signed)
Pt generalized abdp pain and bialt groin pain, urinary freq started last night-NAD-steady gait

## 2020-09-26 NOTE — Discharge Instructions (Addendum)
Take antibiotics as prescribed.  Take entire course, even if symptoms improve. Use Tylenol or ibuprofen as needed for pain. Use heating pads to help with pain. Make sure you stay well-hydrated with water. Follow with your primary care doctor as needed for further evaluation of her symptoms. Return to the emergency room if you develop high fevers, persistent vomiting, severe worsening pain, inability urinate, or any new or worsening, or concerning symptoms.

## 2020-10-18 IMAGING — CT CT RENAL STONE PROTOCOL
2 of 4 series · 16 of 46 positions shown, 18 images · non-contrast
Comparison: None.

CLINICAL DATA: Flank pain.

EXAM:
CT ABDOMEN AND PELVIS WITHOUT CONTRAST
TECHNIQUE: Multidetector CT imaging of the abdomen and pelvis was performed
following the standard protocol without oral or IV contrast.

[Series 2: axial st · axial · 0.87mm/px · z∈[-669,-154]mm · 13 of 113 slices shown, 15 images]
[im 5/113  soft-tissue]
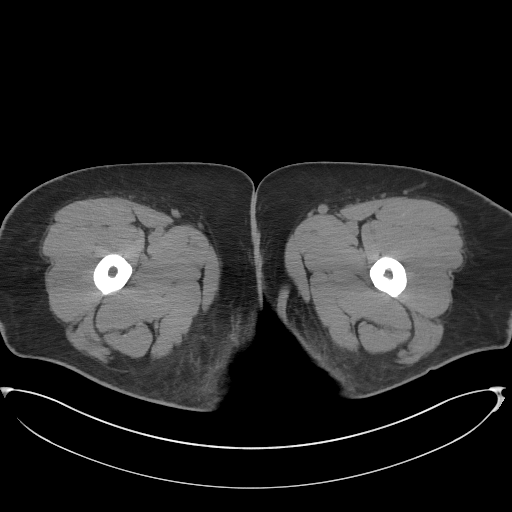
[im 5/113  bone]
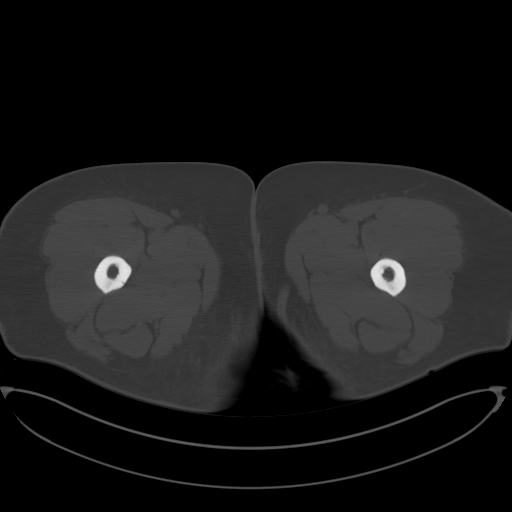
[im 14/113  soft-tissue]
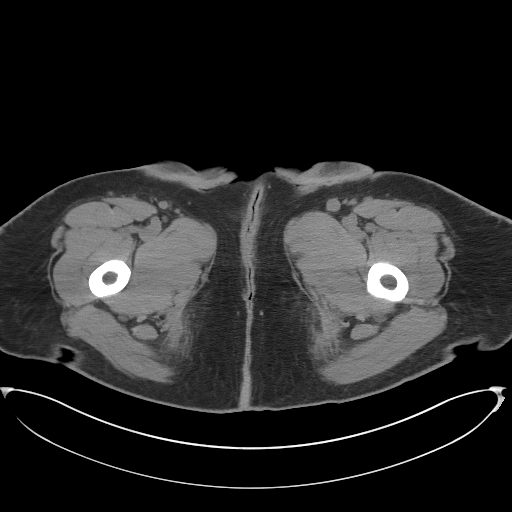
[im 23/113  soft-tissue]
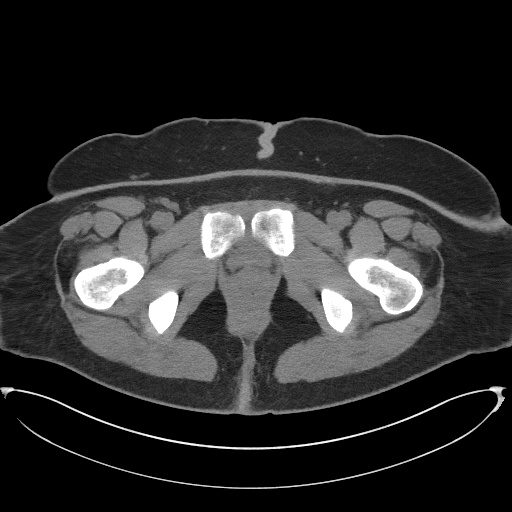
[im 32/113  soft-tissue]
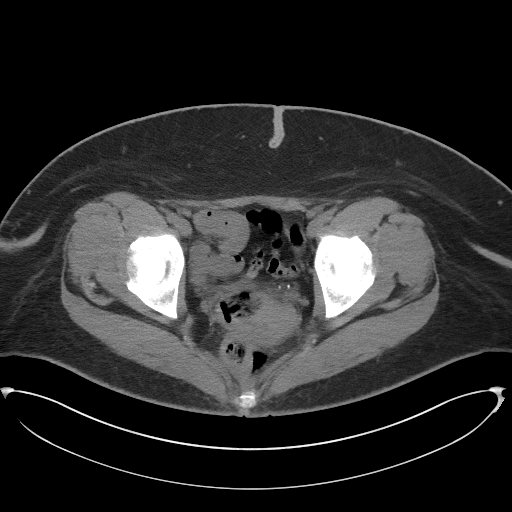
[im 41/113  soft-tissue]
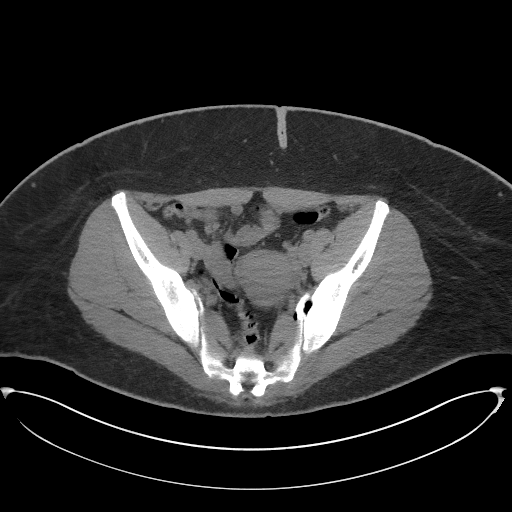
[im 50/113  soft-tissue]
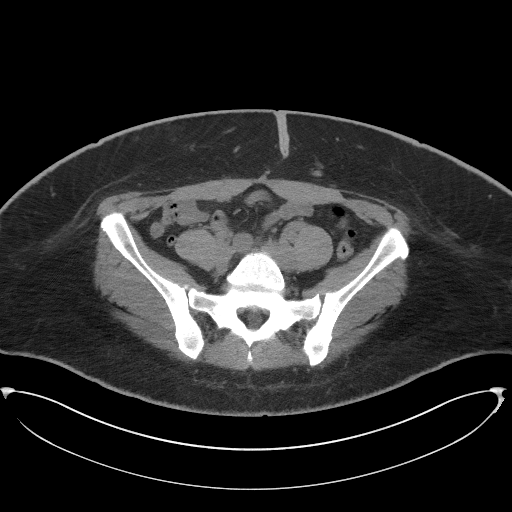
[im 59/113  soft-tissue]
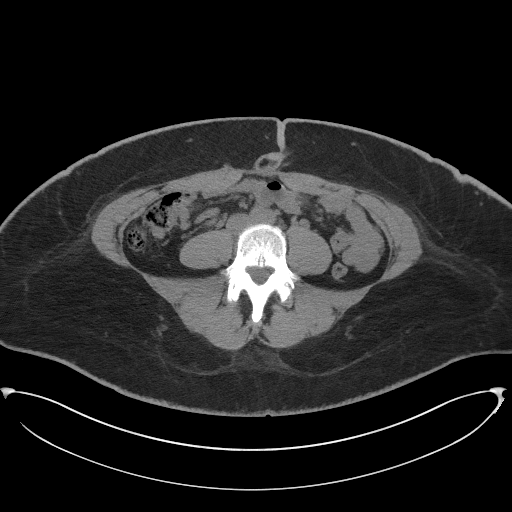
[im 63/113  soft-tissue]
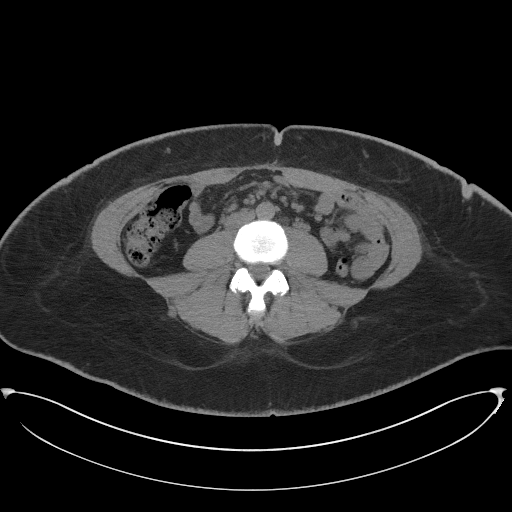
[im 72/113  soft-tissue]
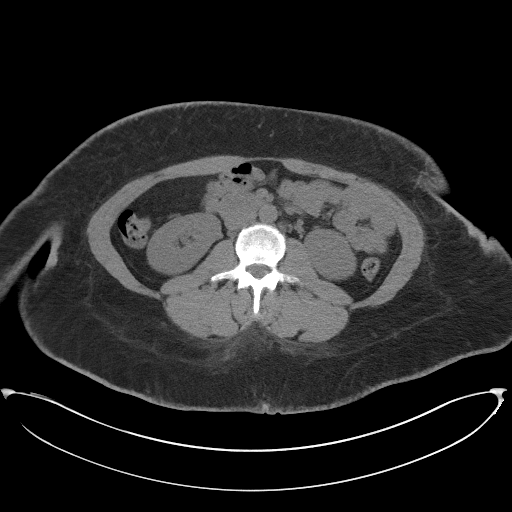
[im 72/113  bone]
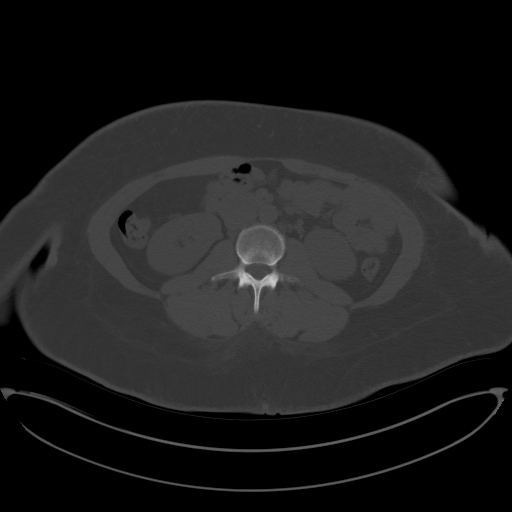
[im 81/113  soft-tissue]
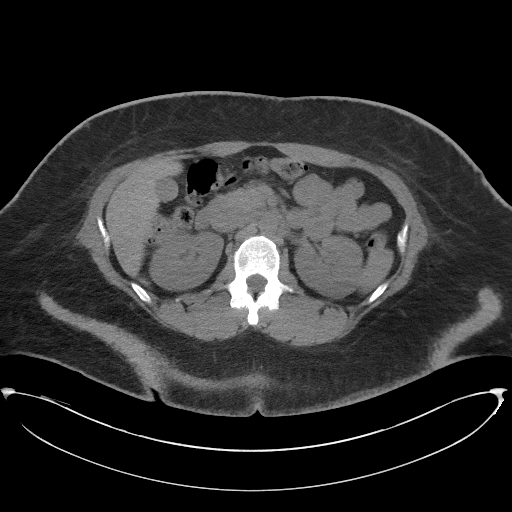
[im 90/113  soft-tissue]
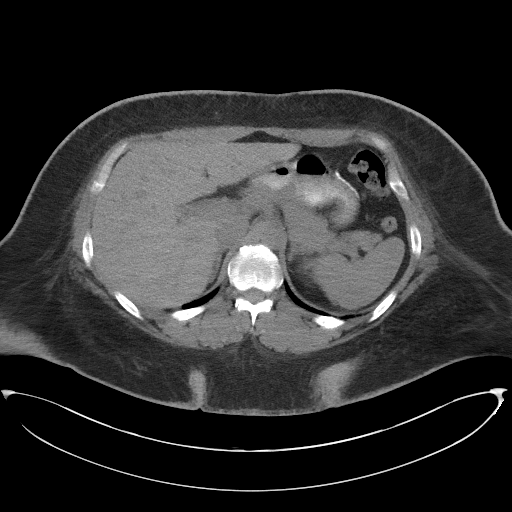
[im 99/113  soft-tissue]
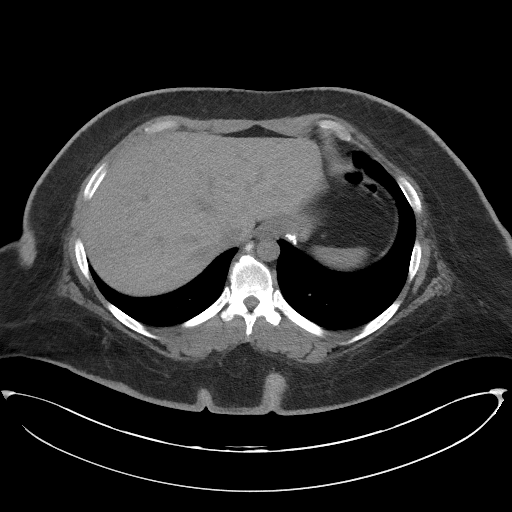
[im 108/113  soft-tissue]
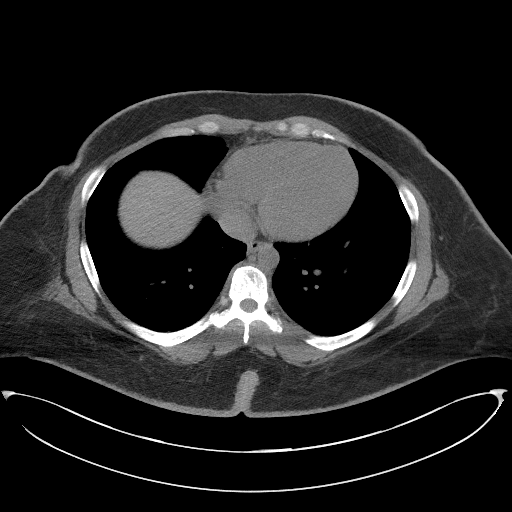

[Series 5: coronal st · coronal · 1.02mm/px · 3 of 96 slices shown]
[im 32/96  soft-tissue]
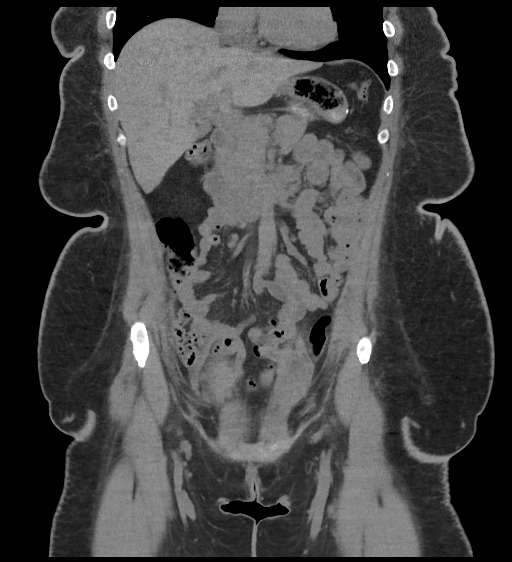
[im 43/96  soft-tissue]
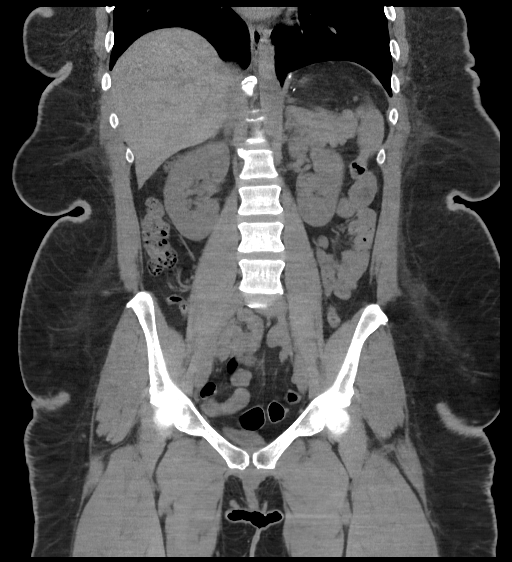
[im 53/96  soft-tissue]
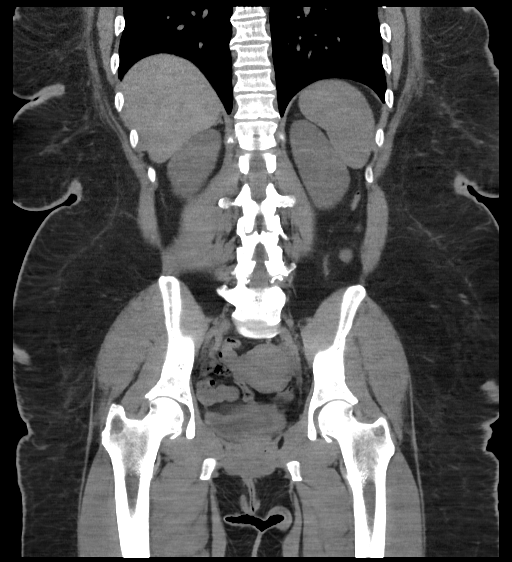

[16 of 46 positions shown; findings below may reference images not displayed]

FINDINGS: Lower chest: Lung bases are clear.

Hepatobiliary: No focal liver lesions are evident on this
noncontrast enhanced study. Gallbladder wall is not appreciably
thickened. There is no biliary duct dilatation.

Pancreas: No pancreatic mass or inflammatory focus.

Spleen: No splenic lesions are evident. A small accessory spleen is
noted anterior and medial to the spleen.

Adrenals/Urinary Tract: Adrenals bilaterally appear unremarkable.
Kidneys bilaterally show no evident mass or hydronephrosis on either
side. There is no appreciable renal or ureteral calculus on either
side. Urinary bladder is midline with wall thickness within normal
limits.

Stomach/Bowel: The patient is status post gastric bypass procedure.
There is no wall thickening or fluid in areas of previous surgery.
Elsewhere, there is no evident bowel wall or mesenteric thickening.
There is no appreciable bowel obstruction. There is no free air or
portal venous air.

Vascular/Lymphatic: There is no abdominal aortic aneurysm. No
vascular lesions are appreciable on this noncontrast enhanced study.
There is no evident adenopathy in the abdomen or pelvis by size
criteria. Subcentimeter lymph nodes in the inguinal regions is felt
to be nonspecific.

Reproductive: The uterus is anteverted. There is no appreciable
pelvic mass.

Other: The appendix appears normal. There is no abscess or ascites
in the abdomen or pelvis. There is umbilical region scarring. There
is a minimal ventral hernia containing only fat.

Musculoskeletal: There is degenerative change in the lower lumbar
spine. There are no blastic or lytic bone lesions. There is no
intramuscular lesion.
IMPRESSION: 1. Status post gastric bypass procedure without evident complicating
features. No evident bowel obstruction. No abscess appreciable in
the abdomen or pelvis. Appendix appears normal.

2.  No renal or ureteral calculi.  No hydronephrosis on either side.

3. Rather minimal ventral hernia containing only fat. Periumbilical
scarring noted.

## 2020-10-22 ENCOUNTER — Emergency Department (HOSPITAL_BASED_OUTPATIENT_CLINIC_OR_DEPARTMENT_OTHER): Payer: BC Managed Care – PPO

## 2020-10-22 ENCOUNTER — Emergency Department (HOSPITAL_BASED_OUTPATIENT_CLINIC_OR_DEPARTMENT_OTHER)
Admission: EM | Admit: 2020-10-22 | Discharge: 2020-10-22 | Disposition: A | Discharge status: Home / Self Care | Payer: BC Managed Care – PPO | Attending: Emergency Medicine | Admitting: Emergency Medicine

## 2020-10-22 ENCOUNTER — Encounter (HOSPITAL_BASED_OUTPATIENT_CLINIC_OR_DEPARTMENT_OTHER): Payer: Self-pay

## 2020-10-22 ENCOUNTER — Other Ambulatory Visit: Payer: Self-pay

## 2020-10-22 DIAGNOSIS — J4521 Mild intermittent asthma with (acute) exacerbation: Secondary | ICD-10-CM

## 2020-10-22 DIAGNOSIS — R202 Paresthesia of skin: Secondary | ICD-10-CM | POA: Diagnosis not present

## 2020-10-22 DIAGNOSIS — R000 Tachycardia, unspecified: Secondary | ICD-10-CM | POA: Diagnosis not present

## 2020-10-22 DIAGNOSIS — R0789 Other chest pain: Secondary | ICD-10-CM | POA: Diagnosis not present

## 2020-10-22 DIAGNOSIS — J45909 Unspecified asthma, uncomplicated: Secondary | ICD-10-CM | POA: Diagnosis not present

## 2020-10-22 DIAGNOSIS — Z79899 Other long term (current) drug therapy: Secondary | ICD-10-CM | POA: Diagnosis not present

## 2020-10-22 DIAGNOSIS — I10 Essential (primary) hypertension: Secondary | ICD-10-CM | POA: Insufficient documentation

## 2020-10-22 DIAGNOSIS — R0602 Shortness of breath: Secondary | ICD-10-CM | POA: Insufficient documentation

## 2020-10-22 LAB — CBC
HCT: 36.8 % (ref 36.0–46.0)
Hemoglobin: 11.3 g/dL — ABNORMAL LOW (ref 12.0–15.0)
MCH: 23.7 pg — ABNORMAL LOW (ref 26.0–34.0)
MCHC: 30.7 g/dL (ref 30.0–36.0)
MCV: 77.1 fL — ABNORMAL LOW (ref 80.0–100.0)
Platelets: 268 10*3/uL (ref 150–400)
RBC: 4.77 MIL/uL (ref 3.87–5.11)
RDW: 18.9 % — ABNORMAL HIGH (ref 11.5–15.5)
WBC: 8.1 10*3/uL (ref 4.0–10.5)
nRBC: 0 % (ref 0.0–0.2)

## 2020-10-22 LAB — BASIC METABOLIC PANEL
Anion gap: 6 (ref 5–15)
BUN: 11 mg/dL (ref 6–20)
CO2: 23 mmol/L (ref 22–32)
Calcium: 8.9 mg/dL (ref 8.9–10.3)
Chloride: 108 mmol/L (ref 98–111)
Creatinine, Ser: 0.84 mg/dL (ref 0.44–1.00)
GFR, Estimated: >60 mL/min (ref >60)
Glucose, Bld: 96 mg/dL (ref 70–99)
Potassium: 3.6 mmol/L (ref 3.5–5.1)
Sodium: 137 mmol/L (ref 135–145)

## 2020-10-22 LAB — TROPONIN I (HIGH SENSITIVITY)
Troponin I (High Sensitivity): 2 ng/L (ref <18)
Troponin I (High Sensitivity): <2 ng/L (ref <18)

## 2020-10-22 LAB — D-DIMER, QUANTITATIVE: D-Dimer, Quant: 1.1 ug/mL-FEU — ABNORMAL HIGH (ref 0.00–0.50)

## 2020-10-22 LAB — PREGNANCY, URINE: Preg Test, Ur: NEGATIVE

## 2020-10-22 MED ORDER — IOHEXOL 350 MG/ML SOLN
100.0000 mL | Freq: Once | INTRAVENOUS | Status: AC | PRN
  Administered 2020-10-22: 100 mL via INTRAVENOUS

## 2020-10-22 MED ORDER — PREDNISONE 10 MG PO TABS: 20.0000 mg | ORAL_TABLET | Freq: Every day | ORAL | 0 refills | Status: DC

## 2020-10-22 MED ORDER — IPRATROPIUM-ALBUTEROL 0.5-2.5 (3) MG/3ML IN SOLN
3.0000 mL | Freq: Once | RESPIRATORY_TRACT | Status: AC
  Administered 2020-10-22: 3 mL via RESPIRATORY_TRACT
  Filled 2020-10-22: qty 3

## 2020-10-22 MED ORDER — METHYLPREDNISOLONE SODIUM SUCC 125 MG IJ SOLR
125.0000 mg | Freq: Once | INTRAMUSCULAR | Status: AC
  Administered 2020-10-22: 125 mg via INTRAVENOUS
  Filled 2020-10-22: qty 2

## 2020-10-22 NOTE — Discharge Instructions (Addendum)
You were seen today for an asthma exacerbation.  There are no signs of blood clots or pneumonia on your work-up today.  Please take prednisone 40 mg a day for the next 5 days.  This should help resolve your asthma.  Please make sure you follow-up with your primary care provider in order to get a second inhaler. Continue to use it as needed.  If things change or worsen please return back to the ED for further evaluation.

## 2020-10-22 NOTE — ED Notes (Signed)
Patient transported to CT 

## 2020-10-22 NOTE — ED Provider Notes (Signed)
[facility] EMERGENCY DEPARTMENT Provider Note   CSN: [id number] Arrival date & time: 10/22/20  1000     History Chief Complaint  Patient presents with   Shortness of Breath    Monica Cantu is a 35 y.o. female.  Patient presents with 2 days of shortness of breath.  Worse when she lays down flat.  History of asthma, does not use intranasal steroids daily as prescribed.  Last time using rescue inhaler was 1 hour ago.  She also describes some discomfort in her chest and numbness and tingling to all 4 extremities.   Shortness of Breath Severity:  Moderate Onset quality:  Gradual Duration:  2 days Timing:  Constant Progression:  Unable to specify Chronicity:  New Context: not activity and not animal exposure   Relieved by:  Inhaler and sitting up Worsened by:  Exertion Associated symptoms: chest pain and wheezing   Risk factors: no obesity, no prolonged immobilization, no recent surgery and no tobacco use        Past Medical History:  Diagnosis Date   Allergy    Asthma    Hypertension    Multiple thyroid nodules    per patient    PONV (postoperative nausea and vomiting)    with D+ C and tonsillectomy    Pre-diabetes     Patient Active Problem List   Diagnosis Date Noted   S/P laparoscopic sleeve gastrectomyNov 2018 03/02/2017   Low back pain radiating to left leg 09/30/2016   Multinodular goiter 05/27/2016   Annual physical exam 02/22/2016   PCP NOTES >>>>>>>>>>>>>>>>>>>>> 05/31/2015   Asthma 05/11/2015   Morbid obesity (HCC) 04/24/2015   Elevated blood pressure 04/24/2015    Past Surgical History:  Procedure Laterality Date   DILATION AND CURETTAGE OF UTERUS     LAPAROSCOPIC GASTRIC SLEEVE RESECTION N/A 03/02/2017   Procedure: LAPAROSCOPIC GASTRIC SLEEVE RESECTION, UPPER ENDO;  Surgeon: Luretha Murphy, MD;  Location: [facility];  Service: General;  Laterality: N/A;   TONSILLECTOMY AND ADENOIDECTOMY       OB History     Gravida  4   Para   2   Term  2   Preterm  0   AB  2   Living  2      SAB  2   IAB      Ectopic  0   Multiple      Live Births  2           Family History  Problem Relation Age of Onset   Hyperlipidemia Father    Diabetes Maternal Grandmother    Breast cancer Maternal Grandmother    Heart disease Paternal Grandmother    Hypertension Paternal Grandmother    Stroke Other    Colon cancer Neg Hx     Social History   Tobacco Use   Smoking status: Never   Smokeless tobacco: Never  Vaping Use   Vaping Use: Never used  Substance Use Topics   Alcohol use: No    Alcohol/week: 0.0 standard drinks   Drug use: No    Home Medications Prior to Admission medications   Medication Sig Start Date End Date Taking? Authorizing Provider  albuterol (VENTOLIN HFA) 108 (90 Base) MCG/ACT inhaler Inhale 1-2 puffs into the lungs every 4 (four) hours as needed for wheezing or shortness of breath. 05/18/18   Wanda Plump, MD  azithromycin (ZITHROMAX) 250 MG tablet Take 1 tablet (250 mg total) by mouth daily. Take first 2 tablets  together, then 1 every day until finished. 12/29/19   Merrilee Jansky, MD  cetirizine (ZYRTEC) 10 MG tablet Take 1 tablet (10 mg total) by mouth at bedtime. 08/11/16   Wanda Plump, MD  cetirizine-pseudoephedrine (ZYRTEC-D) 5-120 MG tablet Take 1 tablet by mouth daily. 09/26/19   Moshe Cipro, NP  FLOVENT [facility] 110 MCG/ACT inhaler  05/18/18   [provider]  fluticasone (FLONASE) 50 MCG/ACT nasal spray Place 1 spray into both nostrils daily. 12/26/19   Merrilee Jansky, MD  ibuprofen (ADVIL) 600 MG tablet Take 1 tablet (600 mg total) by mouth every 6 (six) hours as needed. 12/26/19   Lamptey, Britta Mccreedy, MD  nitrofurantoin, macrocrystal-monohydrate, (MACROBID) 100 MG capsule Take 1 capsule (100 mg total) by mouth 2 (two) times daily. 12/27/19   Saguier, Ramon Dredge, PA-C  pantoprazole (PROTONIX) 40 MG tablet Take 40 mg by mouth daily.    [provider]   phenazopyridine (PYRIDIUM) 200 MG tablet Take 1 tablet (200 mg total) by mouth 3 (three) times daily as needed for pain. 12/27/19   Saguier, Ramon Dredge, PA-C  phentermine 37.5 MG capsule Take 1 capsule by mouth daily. 02/16/19 12/26/19  [provider]    Allergies    Amoxicillin-pot clavulanate  Review of Systems   Review of Systems  Respiratory:  Positive for shortness of breath and wheezing.   Cardiovascular:  Positive for chest pain.   Physical Exam Updated Vital Signs BP (!) 153/104 (BP Location: Right Arm)   Pulse (!) 103   Temp 99.1 F (37.3 C) (Oral)   Resp (!) 22   Ht 5\' 4"  (1.626 m)   Wt (!) 154.2 kg   LMP 10/07/2020 (Exact Date)   SpO2 100%   BMI 58.36 kg/m   Physical Exam Vitals and nursing note reviewed. Exam conducted with a chaperone present.  Constitutional:      Appearance: Normal appearance.  HENT:     Head: Normocephalic and atraumatic.  Eyes:     General: No scleral icterus.       Right eye: No discharge.        Left eye: No discharge.     Extraocular Movements: Extraocular movements intact.     Pupils: Pupils are equal, round, and reactive to light.  Cardiovascular:     Rate and Rhythm: Regular rhythm. Tachycardia present.     Pulses: Normal pulses.     Heart sounds: Normal heart sounds. No murmur heard.   No friction rub. No gallop.  Pulmonary:     Effort: Pulmonary effort is normal. No respiratory distress.     Breath sounds: Wheezing present.     Comments: Lung sounds slightly decreased due to body habitus.  There were wheezing on forced expiration. Abdominal:     General: Abdomen is flat. Bowel sounds are normal. There is no distension.     Palpations: Abdomen is soft.     Tenderness: There is no abdominal tenderness.  Musculoskeletal:     Right lower leg: No edema.     Left lower leg: No edema.     Comments: No posterior calf tenderness, legs are roughly symmetric bilaterally.  DP and PT 2+ bilaterally.  Skin:    General: Skin is  warm and dry.     Coloration: Skin is not jaundiced.  Neurological:     Mental Status: She is alert. Mental status is at baseline.     Coordination: Coordination normal.    ED Results / Procedures / Treatments  Labs (all labs ordered are listed, but only abnormal results are displayed) Labs Reviewed  BASIC METABOLIC PANEL  CBC  PREGNANCY, URINE  TROPONIN I (HIGH SENSITIVITY)    EKG EKG Interpretation  Date/Time:  Monday October 22 2020 10:17:59 EDT Ventricular Rate:  122 PR Interval:  152 QRS Duration: 82 QT Interval:  310 QTC Calculation: 441 R Axis:   41 Text Interpretation: Sinus tachycardia Cannot rule out Anterior infarct , age undetermined Abnormal ECG Confirmed by Marianna Fuss ([id number]) on 10/22/2020 10:36:45 AM  Radiology No results found.  Procedures Procedures   Medications Ordered in ED Medications - No data to display  ED Course  I have reviewed the triage vital signs and the nursing notes.  Pertinent labs & imaging results that were available during my care of the patient were reviewed by me and considered in my medical decision making (see chart for details).    MDM Rules/Calculators/A&P                          Patient is tachycardic and short of breath on initial intake.  She does have forced expiratory wheezes.  She is not on any blood thinners, has not had any recent surgery, is not on oral birth control, has not traveled recently, however given she is tachycardic I will D-dimer her.  I suspect this is likely an asthma exacerbation given she has not been using her medicines as prescribed.  We will give her Solu-Medrol and a DuoNeb.  We will also check basic labs and evaluate first troponin to assess for ACS.  Patient reports improvement in her shortness of breath after DuoNeb and Solu-Medrol.  The expiratory wheezing has resolved on exam.  Patient work-up is reassuring, although the dimer is elevated so I ordered a CTA.  CTA came back negative for  PE.  Chest x-ray was not notable for pneumonia.  No signs of ACS, electrolyte derangement, AKI.  At this time patient's vitals are stable and she is satting appropriately on room air.  I suspect this was due to an asthma exacerbation and she is appropriate for discharge at this time.  Appropriate medication prescribed.  Return precautions discussed with the patient and she verbalized understanding.  Discussed HPI, physical exam and plan of care for this patient with attending Mayra Neer. The attending physician evaluated this patient as part of a shared visit and agrees with plan of care.   Final Clinical Impression(s) / ED Diagnoses Final diagnoses:  None    Rx / DC Orders ED Discharge Orders     None        Theron Arista, [place] 10/22/20 1503    Milagros Loll, MD 10/22/20 [phone]

## 2020-10-22 NOTE — ED Notes (Signed)
ED Provider at bedside. 

## 2020-10-22 NOTE — ED Triage Notes (Signed)
Pt c/o shortness of breath & chest tightness since yesterday, states got worse overnight. States worse with lying down. States arms and legs are tingling. Hx asthma, tried inhaler without relief.

## 2020-10-23 DIAGNOSIS — R030 Elevated blood-pressure reading, without diagnosis of hypertension: Secondary | ICD-10-CM | POA: Diagnosis not present

## 2020-10-23 DIAGNOSIS — E785 Hyperlipidemia, unspecified: Secondary | ICD-10-CM | POA: Diagnosis not present

## 2020-10-23 DIAGNOSIS — D500 Iron deficiency anemia secondary to blood loss (chronic): Secondary | ICD-10-CM | POA: Diagnosis not present

## 2020-10-23 DIAGNOSIS — Z6841 Body Mass Index (BMI) 40.0 and over, adult: Secondary | ICD-10-CM | POA: Diagnosis not present

## 2020-10-23 DIAGNOSIS — E559 Vitamin D deficiency, unspecified: Secondary | ICD-10-CM | POA: Diagnosis not present

## 2020-11-12 ENCOUNTER — Encounter (HOSPITAL_COMMUNITY): Payer: Self-pay

## 2020-11-12 ENCOUNTER — Ambulatory Visit (HOSPITAL_COMMUNITY)
Admission: EM | Admit: 2020-11-12 | Discharge: 2020-11-12 | Disposition: A | Discharge status: Home / Self Care | Payer: BC Managed Care – PPO | Attending: Physician Assistant | Admitting: Physician Assistant

## 2020-11-12 ENCOUNTER — Other Ambulatory Visit: Payer: Self-pay

## 2020-11-12 DIAGNOSIS — J029 Acute pharyngitis, unspecified: Secondary | ICD-10-CM | POA: Diagnosis not present

## 2020-11-12 DIAGNOSIS — J020 Streptococcal pharyngitis: Secondary | ICD-10-CM

## 2020-11-12 LAB — POCT RAPID STREP A, ED / UC: Streptococcus, Group A Screen (Direct): POSITIVE — AB

## 2020-11-12 MED ORDER — ACETAMINOPHEN 325 MG PO TABS
ORAL_TABLET | ORAL | Status: AC
  Filled 2020-11-12: qty 2

## 2020-11-12 MED ORDER — ACETAMINOPHEN 325 MG PO TABS
650.0000 mg | ORAL_TABLET | Freq: Once | ORAL | Status: AC
  Administered 2020-11-12: 650 mg via ORAL

## 2020-11-12 MED ORDER — AMOXICILLIN 500 MG PO CAPS: 500.0000 mg | ORAL_CAPSULE | Freq: Two times a day (BID) | ORAL | 0 refills | Status: DC

## 2020-11-12 MED ORDER — AMOXICILLIN 500 MG PO CAPS: 500.0000 mg | ORAL_CAPSULE | Freq: Three times a day (TID) | ORAL | 0 refills | Status: DC

## 2020-11-12 MED ORDER — FLUCONAZOLE 150 MG PO TABS: 150.0000 mg | ORAL_TABLET | Freq: Once | ORAL | 0 refills | Status: AC

## 2020-11-12 NOTE — Discharge Instructions (Addendum)
Take amoxicillin twice daily.  This can upset your stomach make sure to take it with food.  If you have severe abdominal upset you can let us know and we can send in a different medication (azithromycin).  I did send in Diflucan in case you needed which should be used at the first sign of yeast infection symptoms.  You need to be out of work for 24 hours and you are contagious for 24 hours.  Use Tylenol ibuprofen for symptom relief.  If you have any difficulty speaking, difficulty swallowing, muffled voice you need to go to the emergency room.

## 2020-11-12 NOTE — ED Triage Notes (Signed)
Pt reports bilateral ear pain, sore throat, fever and headache x 3 days. Ibuprofen gives no relief.

## 2020-11-12 NOTE — ED Provider Notes (Addendum)
[facility]    CSN: [id number] Arrival date & time: 11/12/20  1743      History   Chief Complaint Chief Complaint  Patient presents with   Sore Throat   Headache   Fever    HPI Monica Cantu is a 35 y.o. female.   Patient presents today with a 3-day history of URI symptoms.  Reports severe sore throat that is rated 8/9 on a 0-10 pain scale, described as sharp, worse with swallowing, no alleviating factors identified.  She does report associated cough, fever, body aches, headache, fatigue, malaise.  She denies chest pain, shortness of breath, muffled voice, difficulty swallowing.  She has tried ibuprofen without improvement of symptoms.  She does have a history of allergies and has been taking allergy medication as prescribed.  She also has a history of asthma but denies increased use of albuterol or shortness of breath.  She denies any known sick contacts but does work at a daycare.  She has not had influenza vaccination but has had COVID-19 vaccine.  Denies any recent antibiotics.  Reports she is having difficulty with daily activities as a result of symptoms particularly as she has no appetite and feels tired.   Past Medical History:  Diagnosis Date   Allergy    Asthma    Hypertension    Multiple thyroid nodules    per patient    PONV (postoperative nausea and vomiting)    with D+ C and tonsillectomy    Pre-diabetes     Patient Active Problem List   Diagnosis Date Noted   S/P laparoscopic sleeve gastrectomyNov 2018 03/02/2017   Low back pain radiating to left leg 09/30/2016   Multinodular goiter 05/27/2016   Annual physical exam 02/22/2016   PCP NOTES >>>>>>>>>>>>>>>>>>>>> 05/31/2015   Asthma 05/11/2015   Morbid obesity (HCC) 04/24/2015   Elevated blood pressure 04/24/2015    Past Surgical History:  Procedure Laterality Date   DILATION AND CURETTAGE OF UTERUS     LAPAROSCOPIC GASTRIC SLEEVE RESECTION N/A 03/02/2017   Procedure: LAPAROSCOPIC GASTRIC  SLEEVE RESECTION, UPPER ENDO;  Surgeon: Luretha Murphy, MD;  Location: [facility];  Service: General;  Laterality: N/A;   TONSILLECTOMY AND ADENOIDECTOMY      OB History     Gravida  4   Para  2   Term  2   Preterm  0   AB  2   Living  2      SAB  2   IAB      Ectopic  0   Multiple      Live Births  2            Home Medications    Prior to Admission medications   Medication Sig Start Date End Date Taking? Authorizing Provider  fluconazole (DIFLUCAN) 150 MG tablet Take 1 tablet (150 mg total) by mouth once for 1 dose. 11/12/20 11/12/20 Yes Teriah Muela K, PA-C  albuterol (VENTOLIN HFA) 108 (90 Base) MCG/ACT inhaler Inhale 1-2 puffs into the lungs every 4 (four) hours as needed for wheezing or shortness of breath. 05/18/18   Wanda Plump, MD  amoxicillin (AMOXIL) 500 MG capsule Take 1 capsule (500 mg total) by mouth 2 (two) times daily. 11/12/20   RaspetFrancis Dowse  FLOVENT HFA 110 MCG/ACT inhaler  05/18/18   [provider]  ibuprofen (ADVIL) 600 MG tablet Take 1 tablet (600 mg total) by mouth every 6 (six) hours as needed. 12/26/19  Merrilee Jansky, MD  phentermine 37.5 MG capsule Take 1 capsule by mouth daily. 02/16/19 12/26/19  [provider]    Family History Family History  Problem Relation Age of Onset   Hyperlipidemia Father    Diabetes Maternal Grandmother    Breast cancer Maternal Grandmother    Heart disease Paternal Grandmother    Hypertension Paternal Grandmother    Stroke Other    Colon cancer Neg Hx     Social History Social History   Tobacco Use   Smoking status: Never   Smokeless tobacco: Never  Vaping Use   Vaping Use: Never used  Substance Use Topics   Alcohol use: No    Alcohol/week: 0.0 standard drinks   Drug use: No     Allergies   Amoxicillin-pot clavulanate   Review of Systems Review of Systems  Constitutional:  Positive for activity change, fatigue and fever. Negative for appetite change.  HENT:   Positive for congestion and sore throat. Negative for sinus pressure, sneezing, trouble swallowing and voice change.   Respiratory:  Positive for cough. Negative for shortness of breath.   Cardiovascular:  Negative for chest pain.  Gastrointestinal:  Negative for abdominal pain, diarrhea, nausea and vomiting.  Musculoskeletal:  Positive for arthralgias and myalgias.  Neurological:  Positive for headaches. Negative for dizziness and light-headedness.    Physical Exam Triage Vital Signs ED Triage Vitals  Enc Vitals Group     BP 11/12/20 1901 (!) 127/93     Pulse Rate 11/12/20 1901 (!) 113     Resp 11/12/20 1901 18     Temp 11/12/20 1901 (!) 100.8 F (38.2 C)     Temp Source 11/12/20 1901 Oral     SpO2 11/12/20 1901 100 %     Weight --      Height --      Head Circumference --      Peak Flow --      Pain Score 11/12/20 1858 10     Pain Loc --      Pain Edu? --      Excl. in GC? --    No data found.  Updated Vital Signs BP (!) 127/93 (BP Location: Right Wrist)   Pulse (!) 113   Temp (!) 100.8 F (38.2 C) (Oral)   Resp 18   SpO2 100%   Visual Acuity Right Eye Distance:   Left Eye Distance:   Bilateral Distance:    Right Eye Near:   Left Eye Near:    Bilateral Near:     Physical Exam Vitals reviewed.  Constitutional:      General: She is awake. She is not in acute distress.    Appearance: Normal appearance. She is normal weight. She is not ill-appearing.     Comments: Very pleasant female appears stated age in no acute distress sitting comfortably in exam room  HENT:     Head: Normocephalic and atraumatic.     Right Ear: Tympanic membrane, ear canal and external ear normal. Tympanic membrane is not erythematous or bulging.     Left Ear: Tympanic membrane, ear canal and external ear normal. Tympanic membrane is not erythematous or bulging.     Nose: Nose normal.     Right Sinus: No maxillary sinus tenderness or frontal sinus tenderness.     Left Sinus: No maxillary  sinus tenderness or frontal sinus tenderness.     Mouth/Throat:     Pharynx: Uvula midline. Posterior oropharyngeal erythema present. No oropharyngeal  exudate.     Tonsils: No tonsillar exudate or tonsillar abscesses.     Comments: Moderate erythema posterior oropharynx Cardiovascular:     Rate and Rhythm: Normal rate and regular rhythm.     Heart sounds: Normal heart sounds, S1 normal and S2 normal. No murmur heard. Pulmonary:     Effort: Pulmonary effort is normal.     Breath sounds: Normal breath sounds. No wheezing, rhonchi or rales.     Comments: Clear to auscultation bilaterally Lymphadenopathy:     Head:     Right side of head: No submental, submandibular or tonsillar adenopathy.     Left side of head: No submental, submandibular or tonsillar adenopathy.     Cervical: No cervical adenopathy.  Psychiatric:        Behavior: Behavior is cooperative.     UC Treatments / Results  Labs (all labs ordered are listed, but only abnormal results are displayed) Labs Reviewed  POCT RAPID STREP A, ED / UC - Abnormal; Notable for the following components:      Result Value   Streptococcus, Group A Screen (Direct) POSITIVE (*)    All other components within normal limits    EKG   Radiology No results found.  Procedures Procedures (including critical care time)  Medications Ordered in UC Medications  acetaminophen (TYLENOL) tablet 650 mg (650 mg Oral Given 11/12/20 1949)    Initial Impression / Assessment and Plan / UC Course  I have reviewed the triage vital signs and the nursing notes.  Pertinent labs & imaging results that were available during my care of the patient were reviewed by me and considered in my medical decision making (see chart for details).      Strep positive in clinic.  Patient was given amoxicillin 500 mg twice daily for 10 days.  She has had GI side effects with Augmentin in the past but reports she has done okay with plain amoxicillin.  Discussed that  if she has a severe GI symptoms she can stop the medication we will send in azithromycin.  She was given 1 dose of Diflucan to be used at the first sign of yeast infection symptoms.  Recommended she alternate Tylenol and ibuprofen for symptom relief.  She was encouraged to throw away her toothbrush 24 to 48 hours after starting antibiotics to avoid reinfection.  Discussed that she has contagious for 24 hours after starting medication.  Discussed alarm symptoms that warrant emergent evaluation.  Strict return precautions given to which patient expressed understanding.  Final Clinical Impressions(s) / UC Diagnoses   Final diagnoses:  Strep pharyngitis  Sore throat     Discharge Instructions      Take amoxicillin twice daily.  This can upset your stomach make sure to take it with food.  If you have severe abdominal upset you can let us know and we can send in a different medication (azithromycin).  I did send in Diflucan in case you needed which should be used at the first sign of yeast infection symptoms.  You need to be out of work for 24 hours and you are contagious for 24 hours.  Use Tylenol ibuprofen for symptom relief.  If you have any difficulty speaking, difficulty swallowing, muffled voice you need to go to the emergency room.     ED Prescriptions     Medication Sig Dispense Auth. Provider   amoxicillin (AMOXIL) 500 MG capsule  (Status: Discontinued) Take 1 capsule (500 mg total) by mouth 3 (three) times  daily. 21 capsule Deunta Beneke K, PA-C   fluconazole (DIFLUCAN) 150 MG tablet Take 1 tablet (150 mg total) by mouth once for 1 dose. 1 tablet Avea Mcgowen K, PA-C   amoxicillin (AMOXIL) 500 MG capsule Take 1 capsule (500 mg total) by mouth 2 (two) times daily. 20 capsule [place] Blizzard, Noberto Retort, PA-C      PDMP not reviewed this encounter.   Jeani Hawking, PA-C 11/12/20 2004    Earma Nicolaou K, PA-C 11/12/20 2005

## 2020-11-18 ENCOUNTER — Ambulatory Visit (HOSPITAL_COMMUNITY)
Admission: EM | Admit: 2020-11-18 | Discharge: 2020-11-18 | Disposition: A | Discharge status: Home / Self Care | Payer: BC Managed Care – PPO | Attending: Family Medicine | Admitting: Family Medicine

## 2020-11-18 ENCOUNTER — Other Ambulatory Visit: Payer: Self-pay

## 2020-11-18 ENCOUNTER — Encounter (HOSPITAL_COMMUNITY): Payer: Self-pay | Admitting: Emergency Medicine

## 2020-11-18 DIAGNOSIS — J069 Acute upper respiratory infection, unspecified: Secondary | ICD-10-CM

## 2020-11-18 DIAGNOSIS — J020 Streptococcal pharyngitis: Secondary | ICD-10-CM | POA: Diagnosis not present

## 2020-11-18 DIAGNOSIS — Z20822 Contact with and (suspected) exposure to covid-19: Secondary | ICD-10-CM | POA: Diagnosis not present

## 2020-11-18 DIAGNOSIS — J4521 Mild intermittent asthma with (acute) exacerbation: Secondary | ICD-10-CM | POA: Diagnosis not present

## 2020-11-18 MED ORDER — NYSTATIN 100000 UNIT/ML MT SUSP: 5.0000 mL | Freq: Four times a day (QID) | OROMUCOSAL | 0 refills | Status: DC

## 2020-11-18 MED ORDER — ONDANSETRON 4 MG PO TBDP: 4.0000 mg | ORAL_TABLET | Freq: Three times a day (TID) | ORAL | 0 refills | Status: DC | PRN

## 2020-11-18 MED ORDER — BUDESONIDE-FORMOTEROL FUMARATE 160-4.5 MCG/ACT IN AERO: 2.0000 | INHALATION_SPRAY | Freq: Two times a day (BID) | RESPIRATORY_TRACT | 0 refills | Status: DC

## 2020-11-18 NOTE — ED Provider Notes (Signed)
[facility]    CSN: [id number] Arrival date & time: 11/18/20  1349      History   Chief Complaint Chief Complaint  Patient presents with   Sore Throat    HPI Monica Cantu is a 35 y.o. female.   Patient presenting today with 2-day history of worsening left-sided sore throat, tongue pain, ear pain and pressure, congestion, cough, fatigue.  She is also having worsening wheezing and chest tightness.  She was treated on 11/12/2020 for strep pharyngitis with amoxicillin and given a Diflucan tablet in case causing a yeast infection.  She was improving for a few days on this, has about 3 days left on her antibiotics.  No new known sick contacts that she is aware of.  Denies chest pain, significant shortness of breath, abdominal pain, rashes.  Does note some nausea and diarrhea but this is a common issue with her with penicillin antibiotics.  Trying over-the-counter cold and cough medications with minimal relief.   Past Medical History:  Diagnosis Date   Allergy    Asthma    Hypertension    Multiple thyroid nodules    per patient    PONV (postoperative nausea and vomiting)    with D+ C and tonsillectomy    Pre-diabetes     Patient Active Problem List   Diagnosis Date Noted   S/P laparoscopic sleeve gastrectomyNov 2018 03/02/2017   Low back pain radiating to left leg 09/30/2016   Multinodular goiter 05/27/2016   Annual physical exam 02/22/2016   PCP NOTES >>>>>>>>>>>>>>>>>>>>> 05/31/2015   Asthma 05/11/2015   Morbid obesity (HCC) 04/24/2015   Elevated blood pressure 04/24/2015    Past Surgical History:  Procedure Laterality Date   DILATION AND CURETTAGE OF UTERUS     LAPAROSCOPIC GASTRIC SLEEVE RESECTION N/A 03/02/2017   Procedure: LAPAROSCOPIC GASTRIC SLEEVE RESECTION, UPPER ENDO;  Surgeon: Luretha Murphy, MD;  Location: [facility];  Service: General;  Laterality: N/A;   TONSILLECTOMY AND ADENOIDECTOMY      OB History     Gravida  4   Para  2   Term  2    Preterm  0   AB  2   Living  2      SAB  2   IAB      Ectopic  0   Multiple      Live Births  2            Home Medications    Prior to Admission medications   Medication Sig Start Date End Date Taking? Authorizing Provider  budesonide-formoterol (SYMBICORT) 160-4.5 MCG/ACT inhaler Inhale 2 puffs into the lungs 2 (two) times daily. Rinse mouth well with water after each use 11/18/20  Yes Particia Nearing, PA-C  nystatin (MYCOSTATIN) 100000 UNIT/ML suspension Take 5 mLs (500,000 Units total) by mouth 4 (four) times daily. 11/18/20  Yes Particia Nearing, PA-C  ondansetron (ZOFRAN ODT) 4 MG disintegrating tablet Take 1 tablet (4 mg total) by mouth every 8 (eight) hours as needed for nausea or vomiting. 11/18/20  Yes Particia Nearing, PA-C  albuterol (VENTOLIN HFA) 108 (90 Base) MCG/ACT inhaler Inhale 1-2 puffs into the lungs every 4 (four) hours as needed for wheezing or shortness of breath. 05/18/18   Wanda Plump, MD  amoxicillin (AMOXIL) 500 MG capsule Take 1 capsule (500 mg total) by mouth 2 (two) times daily. 11/12/20   Domenica Reamer  FLOVENT HFA 110 MCG/ACT inhaler  05/18/18   [provider]  ibuprofen (ADVIL) 600 MG tablet Take 1 tablet (600 mg total) by mouth every 6 (six) hours as needed. 12/26/19   LampteyBritta Mccreedy, MD  phentermine 37.5 MG capsule Take 1 capsule by mouth daily. 02/16/19 12/26/19  [provider]    Family History Family History  Problem Relation Age of Onset   Hyperlipidemia Father    Diabetes Maternal Grandmother    Breast cancer Maternal Grandmother    Heart disease Paternal Grandmother    Hypertension Paternal Grandmother    Stroke Other    Colon cancer Neg Hx     Social History Social History   Tobacco Use   Smoking status: Never   Smokeless tobacco: Never  Vaping Use   Vaping Use: Never used  Substance Use Topics   Alcohol use: No    Alcohol/week: 0.0 standard drinks   Drug use: No      Allergies   Amoxicillin-pot clavulanate   Review of Systems Review of Systems Per HPI  Physical Exam Triage Vital Signs ED Triage Vitals  Enc Vitals Group     BP 11/18/20 1426 (!) 147/72     Pulse Rate 11/18/20 1426 94     Resp 11/18/20 1426 (!) 22     Temp 11/18/20 1426 98.5 F (36.9 C)     Temp Source 11/18/20 1426 Oral     SpO2 11/18/20 1426 98 %     Weight --      Height --      Head Circumference --      Peak Flow --      Pain Score 11/18/20 1422 9     Pain Loc --      Pain Edu? --      Excl. in GC? --    No data found.  Updated Vital Signs BP (!) 147/72 (BP Location: Left Arm)   Pulse 94   Temp 98.5 F (36.9 C) (Oral)   Resp (!) 22   LMP 10/29/2020   SpO2 98%   Visual Acuity Right Eye Distance:   Left Eye Distance:   Bilateral Distance:    Right Eye Near:   Left Eye Near:    Bilateral Near:     Physical Exam Vitals and nursing note reviewed.  Constitutional:      Appearance: Normal appearance. She is not ill-appearing.  HENT:     Head: Atraumatic.     Right Ear: Tympanic membrane normal.     Left Ear: Tympanic membrane normal.     Nose: Rhinorrhea present.     Mouth/Throat:     Mouth: Mucous membranes are moist.     Pharynx: Posterior oropharyngeal erythema present.     Comments: Mild tonsillar erythema bilaterally, no exudates or significant edema.  Uvula midline, no abscess noted on exam.  Several ulcerations present on tongue but no significant white coating. Eyes:     Extraocular Movements: Extraocular movements intact.     Conjunctiva/sclera: Conjunctivae normal.  Cardiovascular:     Rate and Rhythm: Normal rate and regular rhythm.     Heart sounds: Normal heart sounds.  Pulmonary:     Effort: Pulmonary effort is normal.     Breath sounds: Wheezing present. No rales.     Comments: Scattered wheezes bilaterally Abdominal:     General: Bowel sounds are normal. There is no distension.     Palpations: Abdomen is soft.      Tenderness: There is no abdominal tenderness. There is no guarding.  Musculoskeletal:        General: Normal range of motion.     Cervical back: Normal range of motion and neck supple.  Skin:    General: Skin is warm and dry.  Neurological:     Mental Status: She is alert and oriented to person, place, and time.  Psychiatric:        Mood and Affect: Mood normal.        Thought Content: Thought content normal.        Judgment: Judgment normal.     UC Treatments / Results  Labs (all labs ordered are listed, but only abnormal results are displayed) Labs Reviewed  SARS CORONAVIRUS 2 (TAT 6-24 HRS)    EKG   Radiology No results found.  Procedures Procedures (including critical care time)  Medications Ordered in UC Medications - No data to display  Initial Impression / Assessment and Plan / UC Course  I have reviewed the triage vital signs and the nursing notes.  Pertinent labs & imaging results that were available during my care of the patient were reviewed by me and considered in my medical decision making (see chart for details).     Possibly a new viral illness superimposed on strep pharyngitis, which is being treated adequately with amoxicillin.  Will test for COVID today, quarantine until results return and feeling better.  Add Symbicort to albuterol that she is already using for her asthma exacerbation, nystatin in case of developing thrush from antibiotics, and add Zofran due to side effects from amoxicillin while completing course.  Quarantine protocol reviewed, follow-up for worsening symptoms.  Final Clinical Impressions(s) / UC Diagnoses   Final diagnoses:  Viral URI with cough  Strep pharyngitis  Mild intermittent asthma with acute exacerbation   Discharge Instructions   None    ED Prescriptions     Medication Sig Dispense Auth. Provider   budesonide-formoterol (SYMBICORT) 160-4.5 MCG/ACT inhaler Inhale 2 puffs into the lungs 2 (two) times daily. Rinse  mouth well with water after each use 1 each Particia Nearing, PA-C   nystatin (MYCOSTATIN) 100000 UNIT/ML suspension Take 5 mLs (500,000 Units total) by mouth 4 (four) times daily. 60 mL Particia Nearing, PA-C   ondansetron (ZOFRAN ODT) 4 MG disintegrating tablet Take 1 tablet (4 mg total) by mouth every 8 (eight) hours as needed for nausea or vomiting. 20 tablet Particia Nearing, [place]      PDMP not reviewed this encounter.   Particia Nearing, [place] 11/18/20 1520

## 2020-11-18 NOTE — ED Triage Notes (Signed)
Patient was seen Monday (8/8) for sore throat.  Left side of tongue is painful, throat is not any better, left ear pain.

## 2020-11-19 ENCOUNTER — Telehealth (HOSPITAL_COMMUNITY): Payer: Self-pay | Admitting: Emergency Medicine

## 2020-11-19 LAB — SARS CORONAVIRUS 2 (TAT 6-24 HRS): SARS Coronavirus 2: NEGATIVE

## 2020-11-19 MED ORDER — BUDESONIDE-FORMOTEROL FUMARATE 160-4.5 MCG/ACT IN AERO: 2.0000 | INHALATION_SPRAY | Freq: Two times a day (BID) | RESPIRATORY_TRACT | 0 refills | Status: DC

## 2020-11-19 NOTE — Telephone Encounter (Signed)
Verified with [facility] that they did not receive inhaler. Resent to Pharmacy. Pt notified.

## 2020-11-22 ENCOUNTER — Other Ambulatory Visit: Payer: Self-pay

## 2020-11-22 ENCOUNTER — Ambulatory Visit: Payer: BC Managed Care – PPO | Admitting: Family Medicine

## 2020-11-22 ENCOUNTER — Encounter: Payer: Self-pay | Admitting: Family Medicine

## 2020-11-22 VITALS — BP 143/89 | HR 106 | Temp 98.3°F | Resp 18 | Ht 64.02 in | Wt 337.0 lb

## 2020-11-22 DIAGNOSIS — J020 Streptococcal pharyngitis: Secondary | ICD-10-CM

## 2020-11-22 MED ORDER — AZITHROMYCIN 250 MG PO TABS: ORAL_TABLET | ORAL | 0 refills | Status: AC

## 2020-11-22 NOTE — Progress Notes (Signed)
Pt presents to establish care, pt reports that she is still experiencing sore throat from urgent care visit on 8/14, antibiotics have been completed

## 2020-11-23 NOTE — Progress Notes (Signed)
New Patient Office Visit  Subjective:  Patient ID: Monica Cantu, female    DOB: 09-02-1985  Age: 35 y.o. MRN: [id number]  CC:  Chief Complaint  Patient presents with   Establish Care   Sore Throat    HPI Monica Cantu presents for to establish care. Patient reports that she was seen at [facility] for sore throat and dx with strep. She has taken amox but still has sx. She also had nystatin for possible thrush which she reports seems to have resolved.   Past Medical History:  Diagnosis Date   Allergy    Asthma    Hypertension    Multiple thyroid nodules    per patient    PONV (postoperative nausea and vomiting)    with D+ C and tonsillectomy    Pre-diabetes     Past Surgical History:  Procedure Laterality Date   DILATION AND CURETTAGE OF UTERUS     LAPAROSCOPIC GASTRIC SLEEVE RESECTION N/A 03/02/2017   Procedure: LAPAROSCOPIC GASTRIC SLEEVE RESECTION, UPPER ENDO;  Surgeon: Luretha Murphy, MD;  Location: [facility];  Service: General;  Laterality: N/A;   TONSILLECTOMY AND ADENOIDECTOMY      Family History  Problem Relation Age of Onset   Hyperlipidemia Father    Diabetes Maternal Grandmother    Breast cancer Maternal Grandmother    Heart disease Paternal Grandmother    Hypertension Paternal Grandmother    Stroke Other    Colon cancer Neg Hx     Social History   Socioeconomic History   Marital status: Married    Spouse name: Not on file   Number of children: 2   Years of education: Not on file   Highest education level: Not on file  Occupational History   Occupation: Personnel officer fro [employer]  Tobacco Use   Smoking status: Never   Smokeless tobacco: Never  Vaping Use   Vaping Use: Never used  Substance and Sexual Activity   Alcohol use: No    Alcohol/week: 0.0 standard drinks   Drug use: No   Sexual activity: Yes    Partners: Male    Birth control/protection: None  Other Topics Concern   Not on file  Social History Narrative   G4P2  (2  miscarriages)    Household-- pt and 2 children  (2006, 2012)   Social Determinants of Corporate investment banker Strain: Not on file  Food Insecurity: Not on file  Transportation Needs: Not on file  Physical Activity: Not on file  Stress: Not on file  Social Connections: Not on file  Intimate Partner Violence: Not on file    ROS Review of Systems  Constitutional:  Negative for fever.  HENT:  Positive for sore throat. Negative for drooling.   All other systems reviewed and are negative.  Objective:   Today's Vitals: BP (!) 143/89 (BP Location: Left Arm, Patient Position: Sitting, Cuff Size: Large)   Pulse (!) 106   Temp 98.3 F (36.8 C)   Resp 18   Ht 5' 4.02" (1.626 m)   Wt (!) 337 lb (152.9 kg)   LMP 10/29/2020   SpO2 99%   BMI 57.82 kg/m   Physical Exam Vitals and nursing note reviewed.  Constitutional:      General: She is not in acute distress.    Appearance: She is obese.  HENT:     Mouth/Throat:     Mouth: Mucous membranes are dry.     Pharynx: No oropharyngeal exudate or posterior  oropharyngeal erythema.  Cardiovascular:     Rate and Rhythm: Normal rate and regular rhythm.  Pulmonary:     Effort: Pulmonary effort is normal.     Breath sounds: Normal breath sounds.  Musculoskeletal:     Cervical back: Normal range of motion and neck supple.  Lymphadenopathy:     Cervical: No cervical adenopathy.  Neurological:     General: No focal deficit present.     Mental Status: She is alert and oriented to person, place, and time.    Assessment & Plan:   1. Strep pharyngitis Symptoms persist - Course of zithromax prescribed - warm salt water gargles/tylenol/nsaids prn  - azithromycin (ZITHROMAX) 250 MG tablet; Take 2 tablets on day 1, then 1 tablet daily on days 2 through 5  Dispense: 6 tablet; Refill: 0    Outpatient Encounter Medications as of 11/22/2020  Medication Sig   azithromycin (ZITHROMAX) 250 MG tablet Take 2 tablets on day 1, then 1 tablet  daily on days 2 through 5   fluticasone furoate-vilanterol (BREO ELLIPTA) 200-25 MCG/INH AEPB 1 puff   pantoprazole (PROTONIX) 40 MG tablet 1 tablet   phentermine 37.5 MG capsule Take by mouth.   albuterol (VENTOLIN HFA) 108 (90 Base) MCG/ACT inhaler Inhale 1-2 puffs into the lungs every 4 (four) hours as needed for wheezing or shortness of breath.   EPINEPHrine 0.3 mg/0.3 mL IJ SOAJ injection See admin instructions.   FLOVENT HFA 110 MCG/ACT inhaler    fluticasone (FLONASE) 50 MCG/ACT nasal spray 1 spray in each nostril   ibuprofen (ADVIL) 600 MG tablet Take 1 tablet (600 mg total) by mouth every 6 (six) hours as needed.   levocetirizine (XYZAL) 5 MG tablet SMARTSIG:1 Tablet(s) By Mouth Every Evening   montelukast (SINGULAIR) 10 MG tablet Take 10 mg by mouth daily.   nystatin (MYCOSTATIN) 100000 UNIT/ML suspension Take 5 mLs (500,000 Units total) by mouth 4 (four) times daily.   ondansetron (ZOFRAN ODT) 4 MG disintegrating tablet Take 1 tablet (4 mg total) by mouth every 8 (eight) hours as needed for nausea or vomiting.   Vitamin D, Ergocalciferol, (DRISDOL) 1.25 MG (50000 UNIT) CAPS capsule Take 50,000 Units by mouth once a week.   [DISCONTINUED] amoxicillin (AMOXIL) 500 MG capsule Take 1 capsule (500 mg total) by mouth 2 (two) times daily.   [DISCONTINUED] budesonide-formoterol (SYMBICORT) 160-4.5 MCG/ACT inhaler Inhale 2 puffs into the lungs 2 (two) times daily. Rinse mouth well with water after each use   [DISCONTINUED] phentermine 37.5 MG capsule Take 1 capsule by mouth daily.   No facility-administered encounter medications on file as of 11/22/2020.    Follow-up: Return if symptoms worsen or fail to improve.   [name], MD

## 2020-12-05 ENCOUNTER — Ambulatory Visit: Payer: BC Managed Care – PPO | Admitting: Family Medicine

## 2021-04-02 DIAGNOSIS — M9904 Segmental and somatic dysfunction of sacral region: Secondary | ICD-10-CM | POA: Diagnosis not present

## 2021-04-02 DIAGNOSIS — M5136 Other intervertebral disc degeneration, lumbar region: Secondary | ICD-10-CM | POA: Diagnosis not present

## 2021-04-02 DIAGNOSIS — M5137 Other intervertebral disc degeneration, lumbosacral region: Secondary | ICD-10-CM | POA: Diagnosis not present

## 2021-04-02 DIAGNOSIS — M9903 Segmental and somatic dysfunction of lumbar region: Secondary | ICD-10-CM | POA: Diagnosis not present

## 2021-04-03 DIAGNOSIS — M9904 Segmental and somatic dysfunction of sacral region: Secondary | ICD-10-CM | POA: Diagnosis not present

## 2021-04-03 DIAGNOSIS — M5136 Other intervertebral disc degeneration, lumbar region: Secondary | ICD-10-CM | POA: Diagnosis not present

## 2021-04-03 DIAGNOSIS — M5137 Other intervertebral disc degeneration, lumbosacral region: Secondary | ICD-10-CM | POA: Diagnosis not present

## 2021-04-03 DIAGNOSIS — M9903 Segmental and somatic dysfunction of lumbar region: Secondary | ICD-10-CM | POA: Diagnosis not present

## 2021-06-10 DIAGNOSIS — F4322 Adjustment disorder with anxiety: Secondary | ICD-10-CM | POA: Diagnosis not present

## 2021-06-25 DIAGNOSIS — F4322 Adjustment disorder with anxiety: Secondary | ICD-10-CM | POA: Diagnosis not present

## 2021-08-02 ENCOUNTER — Ambulatory Visit: Payer: BC Managed Care – PPO | Admitting: Family Medicine

## 2021-08-16 ENCOUNTER — Encounter: Payer: Self-pay | Admitting: Family Medicine

## 2021-08-16 ENCOUNTER — Ambulatory Visit (INDEPENDENT_AMBULATORY_CARE_PROVIDER_SITE_OTHER): Payer: BC Managed Care – PPO | Admitting: Family Medicine

## 2021-08-16 DIAGNOSIS — F329 Major depressive disorder, single episode, unspecified: Secondary | ICD-10-CM | POA: Diagnosis not present

## 2021-08-16 DIAGNOSIS — Z7689 Persons encountering health services in other specified circumstances: Secondary | ICD-10-CM

## 2021-08-16 DIAGNOSIS — Z6841 Body Mass Index (BMI) 40.0 and over, adult: Secondary | ICD-10-CM | POA: Diagnosis not present

## 2021-08-16 MED ORDER — SERTRALINE HCL 50 MG PO TABS: 50.0000 mg | ORAL_TABLET | Freq: Every day | ORAL | 0 refills | Status: DC

## 2021-08-16 MED ORDER — PHENTERMINE HCL 37.5 MG PO TABS: 37.5000 mg | ORAL_TABLET | Freq: Every day | ORAL | 0 refills | Status: DC

## 2021-08-16 NOTE — Progress Notes (Signed)
? ?Established Patient Office Visit ? ?Subjective   ? ?Patient ID: Monica Cantu, female    DOB: 1986/03/04  Age: 36 y.o. MRN: [id number] ? ?CC: No chief complaint on file. ? ? ?HPI ?Monica Cantu presents for complaint of wanting weight loss. Also reports increased social stressors.  ? ? ?Outpatient Encounter Medications as of 08/16/2021  ?Medication Sig  ? sertraline (ZOLOFT) 50 MG tablet Take 1 tablet (50 mg total) by mouth daily.  ? albuterol (VENTOLIN HFA) 108 (90 Base) MCG/ACT inhaler Inhale 1-2 puffs into the lungs every 4 (four) hours as needed for wheezing or shortness of breath.  ? EPINEPHrine 0.3 mg/0.3 mL IJ SOAJ injection See admin instructions.  ? FLOVENT HFA 110 MCG/ACT inhaler   ? fluticasone (FLONASE) 50 MCG/ACT nasal spray 1 spray in each nostril  ? fluticasone furoate-vilanterol (BREO ELLIPTA) 200-25 MCG/INH AEPB 1 puff  ? ibuprofen (ADVIL) 600 MG tablet Take 1 tablet (600 mg total) by mouth every 6 (six) hours as needed.  ? levocetirizine (XYZAL) 5 MG tablet SMARTSIG:1 Tablet(s) By Mouth Every Evening  ? montelukast (SINGULAIR) 10 MG tablet Take 10 mg by mouth daily.  ? nystatin (MYCOSTATIN) 100000 UNIT/ML suspension Take 5 mLs (500,000 Units total) by mouth 4 (four) times daily.  ? ondansetron (ZOFRAN ODT) 4 MG disintegrating tablet Take 1 tablet (4 mg total) by mouth every 8 (eight) hours as needed for nausea or vomiting.  ? pantoprazole (PROTONIX) 40 MG tablet 1 tablet  ? phentermine 37.5 MG capsule Take by mouth.  ? Vitamin D, Ergocalciferol, (DRISDOL) 1.25 MG (50000 UNIT) CAPS capsule Take 50,000 Units by mouth once a week.  ? ?No facility-administered encounter medications on file as of 08/16/2021.  ? ? ?Past Medical History:  ?Diagnosis Date  ? Allergy   ? Asthma   ? Hypertension   ? Multiple thyroid nodules   ? per patient   ? PONV (postoperative nausea and vomiting)   ? with D+ C and tonsillectomy   ? Pre-diabetes   ? ? ?Past Surgical History:  ?Procedure Laterality Date  ?  DILATION AND CURETTAGE OF UTERUS    ? LAPAROSCOPIC GASTRIC SLEEVE RESECTION N/A 03/02/2017  ? Procedure: LAPAROSCOPIC GASTRIC SLEEVE RESECTION, UPPER ENDO;  Surgeon: Luretha Murphy, MD;  Location: [facility];  Service: General;  Laterality: N/A;  ? TONSILLECTOMY AND ADENOIDECTOMY    ? ? ?Family History  ?Problem Relation Age of Onset  ? Hyperlipidemia Father   ? Diabetes Maternal Grandmother   ? Breast cancer Maternal Grandmother   ? Heart disease Paternal Grandmother   ? Hypertension Paternal Grandmother   ? Stroke Other   ? Colon cancer Neg Hx   ? ? ?Social History  ? ?Socioeconomic History  ? Marital status: Married  ?  Spouse name: Not on file  ? Number of children: 2  ? Years of education: Not on file  ? Highest education level: Not on file  ?Occupational History  ? Occupation: Personnel officer fro [employer]  ?Tobacco Use  ? Smoking status: Never  ? Smokeless tobacco: Never  ?Vaping Use  ? Vaping Use: Never used  ?Substance and Sexual Activity  ? Alcohol use: No  ?  Alcohol/week: 0.0 standard drinks  ? Drug use: No  ? Sexual activity: Yes  ?  Partners: Male  ?  Birth control/protection: None  ?Other Topics Concern  ? Not on file  ?Social History Narrative  ? G4P2  (2 miscarriages)   ? Household-- pt and 2 children  (  2006, 2012)  ? ?Social Determinants of Health  ? ?Financial Resource Strain: Not on file  ?Food Insecurity: Not on file  ?Transportation Needs: Not on file  ?Physical Activity: Not on file  ?Stress: Not on file  ?Social Connections: Not on file  ?Intimate Partner Violence: Not on file  ? ? ?Review of Systems  ?Psychiatric/Behavioral:  Positive for depression. Negative for suicidal ideas. The patient is not nervous/anxious.   ?All other systems reviewed and are negative. ? ?  ? ? ?Objective   ? ?There were no vitals taken for this visit. ? ?Physical Exam ?Vitals and nursing note reviewed.  ?Constitutional:   ?   General: She is not in acute distress. ?   Appearance: She is obese.  ?Cardiovascular:   ?   Rate and Rhythm: Normal rate and regular rhythm.  ?Pulmonary:  ?   Effort: Pulmonary effort is normal.  ?   Breath sounds: Normal breath sounds.  ?Neurological:  ?   General: No focal deficit present.  ?   Mental Status: She is alert and oriented to person, place, and time.  ?Psychiatric:     ?   Mood and Affect: Mood normal.     ?   Behavior: Behavior normal.  ? ? ? ?  ? ?Assessment & Plan:  ? ?1. Class 3 severe obesity due to excess calories with serious comorbidity and body mass index (BMI) of 50.0 to 59.9 in adult [facility]) ?Will prescribe phentermine while patient discusses coverage of wegovy ? ?2. Encounter for weight management ?As above ? ?3. Reactive depression ?Zoloft 50 mg prescribed. Monitor  ? ? ? ?Return in about 4 weeks (around 09/13/2021) for follow up.  ? ?[name], MD ? ? ?

## 2021-09-20 ENCOUNTER — Ambulatory Visit: Payer: BC Managed Care – PPO | Admitting: Family Medicine

## 2021-09-25 ENCOUNTER — Encounter (HOSPITAL_COMMUNITY): Payer: Self-pay | Admitting: *Deleted

## 2021-11-01 ENCOUNTER — Ambulatory Visit: Payer: BC Managed Care – PPO | Admitting: Family Medicine

## 2021-11-01 ENCOUNTER — Encounter: Payer: Self-pay | Admitting: Family Medicine

## 2021-11-01 VITALS — BP 127/84 | HR 86 | Temp 98.3°F | Resp 16 | Wt 359.4 lb

## 2021-11-01 DIAGNOSIS — E042 Nontoxic multinodular goiter: Secondary | ICD-10-CM | POA: Diagnosis not present

## 2021-11-01 DIAGNOSIS — J4530 Mild persistent asthma, uncomplicated: Secondary | ICD-10-CM | POA: Insufficient documentation

## 2021-11-01 DIAGNOSIS — J309 Allergic rhinitis, unspecified: Secondary | ICD-10-CM | POA: Insufficient documentation

## 2021-11-01 DIAGNOSIS — R42 Dizziness and giddiness: Secondary | ICD-10-CM | POA: Diagnosis not present

## 2021-11-01 DIAGNOSIS — H1045 Other chronic allergic conjunctivitis: Secondary | ICD-10-CM | POA: Insufficient documentation

## 2021-11-01 DIAGNOSIS — K219 Gastro-esophageal reflux disease without esophagitis: Secondary | ICD-10-CM | POA: Diagnosis not present

## 2021-11-01 DIAGNOSIS — N920 Excessive and frequent menstruation with regular cycle: Secondary | ICD-10-CM

## 2021-11-01 DIAGNOSIS — J3089 Other allergic rhinitis: Secondary | ICD-10-CM | POA: Insufficient documentation

## 2021-11-01 DIAGNOSIS — R350 Frequency of micturition: Secondary | ICD-10-CM | POA: Diagnosis not present

## 2021-11-01 DIAGNOSIS — J301 Allergic rhinitis due to pollen: Secondary | ICD-10-CM | POA: Insufficient documentation

## 2021-11-01 DIAGNOSIS — L501 Idiopathic urticaria: Secondary | ICD-10-CM | POA: Insufficient documentation

## 2021-11-01 DIAGNOSIS — R110 Nausea: Secondary | ICD-10-CM

## 2021-11-01 LAB — POCT URINALYSIS DIP (CLINITEK)
Bilirubin, UA: NEGATIVE
Blood, UA: NEGATIVE
Glucose, UA: NEGATIVE mg/dL
Ketones, POC UA: NEGATIVE mg/dL
Leukocytes, UA: NEGATIVE
Nitrite, UA: NEGATIVE
POC PROTEIN,UA: 30 — AB
Spec Grav, UA: >=1.03 — AB (ref 1.010–1.025)
Urobilinogen, UA: 0.2 E.U./dL
pH, UA: 5.5 (ref 5.0–8.0)

## 2021-11-01 MED ORDER — PANTOPRAZOLE SODIUM 40 MG PO TBEC: 40.0000 mg | DELAYED_RELEASE_TABLET | Freq: Two times a day (BID) | ORAL | 1 refills | Status: DC

## 2021-11-01 MED ORDER — ONDANSETRON HCL 4 MG PO TABS: 4.0000 mg | ORAL_TABLET | Freq: Three times a day (TID) | ORAL | 0 refills | Status: DC | PRN

## 2021-11-02 ENCOUNTER — Encounter: Payer: Self-pay | Admitting: Family Medicine

## 2021-11-02 LAB — CBC WITH DIFFERENTIAL/PLATELET
Basophils Absolute: 0.1 10*3/uL (ref 0.0–0.2)
Basos: 1 %
EOS (ABSOLUTE): 0.2 10*3/uL (ref 0.0–0.4)
Eos: 3 %
Hematocrit: 31.4 % — ABNORMAL LOW (ref 34.0–46.6)
Hemoglobin: 9.6 g/dL — ABNORMAL LOW (ref 11.1–15.9)
Immature Grans (Abs): 0.1 10*3/uL (ref 0.0–0.1)
Immature Granulocytes: 1 %
Lymphocytes Absolute: 2.7 10*3/uL (ref 0.7–3.1)
Lymphs: 32 %
MCH: 22.4 pg — ABNORMAL LOW (ref 26.6–33.0)
MCHC: 30.6 g/dL — ABNORMAL LOW (ref 31.5–35.7)
MCV: 73 fL — ABNORMAL LOW (ref 79–97)
Monocytes Absolute: 0.6 10*3/uL (ref 0.1–0.9)
Monocytes: 7 %
Neutrophils Absolute: 4.8 10*3/uL (ref 1.4–7.0)
Neutrophils: 56 %
Platelets: 280 10*3/uL (ref 150–450)
RBC: 4.29 x10E6/uL (ref 3.77–5.28)
RDW: 17.1 % — ABNORMAL HIGH (ref 11.7–15.4)
WBC: 8.5 10*3/uL (ref 3.4–10.8)

## 2021-11-02 LAB — CMP14+EGFR
ALT: 9 IU/L (ref 0–32)
AST: 13 IU/L (ref 0–40)
Albumin/Globulin Ratio: 1.3 (ref 1.2–2.2)
Albumin: 4 g/dL (ref 3.9–4.9)
Alkaline Phosphatase: 71 IU/L (ref 44–121)
BUN/Creatinine Ratio: 13 (ref 9–23)
BUN: 10 mg/dL (ref 6–20)
Bilirubin Total: 0.4 mg/dL (ref 0.0–1.2)
CO2: 20 mmol/L (ref 20–29)
Calcium: 9.3 mg/dL (ref 8.7–10.2)
Chloride: 106 mmol/L (ref 96–106)
Creatinine, Ser: 0.8 mg/dL (ref 0.57–1.00)
Globulin, Total: 3 g/dL (ref 1.5–4.5)
Glucose: 84 mg/dL (ref 70–99)
Potassium: 4.7 mmol/L (ref 3.5–5.2)
Sodium: 141 mmol/L (ref 134–144)
Total Protein: 7 g/dL (ref 6.0–8.5)
eGFR: 97 mL/min/{1.73_m2} (ref >59)

## 2021-11-02 LAB — TSH: TSH: 1.03 u[IU]/mL (ref 0.450–4.500)

## 2021-11-02 NOTE — Progress Notes (Signed)
Established Patient Office Visit  Subjective    Patient ID: Monica Cantu, female    DOB: 17-Oct-1985  Age: 36 y.o. MRN: [id number]  CC: No chief complaint on file.   HPI Monica Cantu presents with complaint of dizziness with nause . She reports that she has been having heavy periods because she has not had her IUD replace.  `  Outpatient Encounter Medications as of 11/01/2021  Medication Sig   ondansetron (ZOFRAN) 4 MG tablet Take 1 tablet (4 mg total) by mouth every 8 (eight) hours as needed for nausea or vomiting.   tranexamic acid (LYSTEDA) 650 MG TABS tablet Take 2 tablets 3 times a day by oral route.   [DISCONTINUED] fluticasone furoate-vilanterol (BREO ELLIPTA) 200-25 MCG/ACT AEPB 1 puff   albuterol (VENTOLIN HFA) 108 (90 Base) MCG/ACT inhaler Inhale 1-2 puffs into the lungs every 4 (four) hours as needed for wheezing or shortness of breath.   EPINEPHrine 0.3 mg/0.3 mL IJ SOAJ injection See admin instructions.   FLOVENT HFA 110 MCG/ACT inhaler    fluticasone (FLONASE) 50 MCG/ACT nasal spray 1 spray in each nostril   ibuprofen (ADVIL) 600 MG tablet Take 1 tablet (600 mg total) by mouth every 6 (six) hours as needed.   levocetirizine (XYZAL) 5 MG tablet SMARTSIG:1 Tablet(s) By Mouth Every Evening   montelukast (SINGULAIR) 10 MG tablet Take 10 mg by mouth daily.   nystatin (MYCOSTATIN) 100000 UNIT/ML suspension Take 5 mLs (500,000 Units total) by mouth 4 (four) times daily.   ondansetron (ZOFRAN ODT) 4 MG disintegrating tablet Take 1 tablet (4 mg total) by mouth every 8 (eight) hours as needed for nausea or vomiting.   pantoprazole (PROTONIX) 40 MG tablet Take 1 tablet (40 mg total) by mouth 2 (two) times daily.   phentermine (ADIPEX-P) 37.5 MG tablet Take 1 tablet (37.5 mg total) by mouth daily before breakfast.   phentermine 37.5 MG capsule Take by mouth.   sertraline (ZOLOFT) 50 MG tablet Take 1 tablet (50 mg total) by mouth daily.   Vitamin D, Ergocalciferol,  (DRISDOL) 1.25 MG (50000 UNIT) CAPS capsule Take 50,000 Units by mouth once a week.   [DISCONTINUED] fluticasone furoate-vilanterol (BREO ELLIPTA) 200-25 MCG/INH AEPB 1 puff   [DISCONTINUED] pantoprazole (PROTONIX) 40 MG tablet 1 tablet   No facility-administered encounter medications on file as of 11/01/2021.    Past Medical History:  Diagnosis Date   Allergy    Asthma    Hypertension    Multiple thyroid nodules    per patient    PONV (postoperative nausea and vomiting)    with D+ C and tonsillectomy    Pre-diabetes     Past Surgical History:  Procedure Laterality Date   DILATION AND CURETTAGE OF UTERUS     LAPAROSCOPIC GASTRIC SLEEVE RESECTION N/A 03/02/2017   Procedure: LAPAROSCOPIC GASTRIC SLEEVE RESECTION, UPPER ENDO;  Surgeon: Johnathan Hausen, MD;  Location: [facility];  Service: General;  Laterality: N/A;   TONSILLECTOMY AND ADENOIDECTOMY      Family History  Problem Relation Age of Onset   Hyperlipidemia Father    Diabetes Maternal Grandmother    Breast cancer Maternal Grandmother    Heart disease Paternal Grandmother    Hypertension Paternal Grandmother    Stroke Other    Colon cancer Neg Hx     Social History   Socioeconomic History   Marital status: Married    Spouse name: Not on file   Number of children: 2   Years of education:  Not on file   Highest education level: Not on file  Occupational History   Occupation: Therapist, 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[employer] [place] Use   Smoking status: Never   Smokeless tobacco: Never  Vaping Use   Vaping Use: Never used  Substance and Sexual Activity   Alcohol use: No    Alcohol/week: 0.0 standard drinks of alcohol   Drug use: No   Sexual activity: Yes    Partners: Male    Birth control/protection: None  Other Topics Concern   Not on file  Social History Narrative   G4P2  (2 miscarriages)    Household-- pt and 2 children  (2006, 2012)   Social Determinants of Radio broadcast assistant Strain: Not on file  Food  Insecurity: Not on file  Transportation Needs: Not on file  Physical Activity: Not on file  Stress: Not on file  Social Connections: Not on file  Intimate Partner Violence: Not on file    Review of Systems  Constitutional:  Negative for chills and fever.  Cardiovascular:  Negative for chest pain.  Gastrointestinal:  Positive for nausea and vomiting.  Neurological:  Positive for dizziness. Negative for weakness.  All other systems reviewed and are negative.       Objective    BP 127/84   Pulse 86   Temp 98.3 F (36.8 C) (Oral)   Resp 16   Wt (!) 359 lb 6.4 oz (163 kg)   SpO2 97%   BMI 61.66 kg/m   Physical Exam Vitals and nursing note reviewed.  Constitutional:      General: She is not in acute distress.    Appearance: She is obese.  HENT:     Head: Normocephalic and atraumatic.  Neck:     Thyroid: No thyromegaly.  Cardiovascular:     Rate and Rhythm: Normal rate and regular rhythm.  Pulmonary:     Effort: Pulmonary effort is normal.     Breath sounds: Normal breath sounds.  Musculoskeletal:     Cervical back: Normal range of motion and neck supple.  Neurological:     General: No focal deficit present.     Mental Status: She is alert and oriented to person, place, and time.  Psychiatric:        Mood and Affect: Mood normal.        Behavior: Behavior normal.         Assessment & Plan:   1. Menorrhagia with regular cycle Labs obtained. Patient to follow up with gyn for possible IUD placement - CBC with Differential - TSH - CMP14+EGFR  2. Dizziness and giddiness Monitoring labs prescribed - CBC with Differential - TSH - CMP14+EGFR  3. Gastroesophageal reflux disease, unspecified whether esophagitis present Increasing symptoms. Pantoprazole prescribed  4. Multiple thyroid nodules Referral to endo for further eval/mgt - Ambulatory referral to Endocrinology  5. Nausea Zofran prescribed  6. Urine frequency  - POCT URINALYSIS DIP  (CLINITEK)    No follow-ups on file.   Becky Sax, MD

## 2021-12-04 ENCOUNTER — Encounter: Payer: Self-pay | Admitting: Family Medicine

## 2021-12-04 ENCOUNTER — Ambulatory Visit (INDEPENDENT_AMBULATORY_CARE_PROVIDER_SITE_OTHER): Payer: BC Managed Care – PPO | Admitting: Family Medicine

## 2021-12-04 DIAGNOSIS — U071 COVID-19: Secondary | ICD-10-CM | POA: Diagnosis not present

## 2021-12-04 MED ORDER — NIRMATRELVIR/RITONAVIR (PAXLOVID)TABLET: 3.0000 | ORAL_TABLET | Freq: Two times a day (BID) | ORAL | 0 refills | Status: AC

## 2021-12-04 NOTE — Progress Notes (Signed)
Virtual Visit via Telephone Note  I connected with Monica Cantu on 12/04/21 at  3:40 PM EDT by telephone and verified that I am speaking with the correct person using two identifiers.  Location: Patient: [place] Provider: [place]   I discussed the limitations, risks, security and privacy concerns of performing an evaluation and management service by telephone and the availability of in person appointments. I also discussed with the patient that there may be a patient responsible charge related to this service. The patient expressed understanding and agreed to proceed.   History of Present Illness: Was exposed to someone who was positive for Covid. Now has developed sx with a positive test. Wants paxlovid   Observations/Objective:   Assessment and Plan: 1. COVID-19 Paxlovid  prescribed   Follow Up Instructions:    I discussed the assessment and treatment plan with the patient. The patient was provided an opportunity to ask questions and all were answered. The patient agreed with the plan and demonstrated an understanding of the instructions.   The patient was advised to call back or seek an in-person evaluation if the symptoms worsen or if the condition fails to improve as anticipated.  I provided 7 minutes of non-face-to-face time during this encounter.   [name], MD

## 2022-02-20 ENCOUNTER — Ambulatory Visit (INDEPENDENT_AMBULATORY_CARE_PROVIDER_SITE_OTHER): Payer: BC Managed Care – PPO | Admitting: Family Medicine

## 2022-02-20 ENCOUNTER — Encounter: Payer: Self-pay | Admitting: Family Medicine

## 2022-02-20 VITALS — BP 131/88 | HR 102 | Temp 98.1°F | Resp 16 | Wt 354.6 lb

## 2022-02-20 DIAGNOSIS — J0100 Acute maxillary sinusitis, unspecified: Secondary | ICD-10-CM

## 2022-02-20 MED ORDER — AMOXICILLIN 875 MG PO TABS: 875.0000 mg | ORAL_TABLET | Freq: Two times a day (BID) | ORAL | 0 refills | Status: AC

## 2022-02-21 NOTE — Progress Notes (Signed)
Established Patient Office Visit  Subjective    Patient ID: Monica Cantu, female    DOB: [birth year]  Age: 36 y.o. MRN: [id number]  CC:  Chief Complaint  Patient presents with  . Ear Pain    HPI Monica Cantu presents with complaint of    Outpatient Encounter Medications as of 02/20/2022  Medication Sig  . albuterol (VENTOLIN HFA) 108 (90 Base) MCG/ACT inhaler Inhale 1-2 puffs into the lungs every 4 (four) hours as needed for wheezing or shortness of breath.  Marland Kitchen amoxicillin (AMOXIL) 875 MG tablet Take 1 tablet (875 mg total) by mouth 2 (two) times daily for 10 days.  Marland Kitchen EPINEPHrine 0.3 mg/0.3 mL IJ SOAJ injection See admin instructions.  Marland Kitchen FLOVENT HFA 110 MCG/ACT inhaler   . fluticasone (FLONASE) 50 MCG/ACT nasal spray 1 spray in each nostril  . ibuprofen (ADVIL) 600 MG tablet Take 1 tablet (600 mg total) by mouth every 6 (six) hours as needed.  Marland Kitchen levocetirizine (XYZAL) 5 MG tablet SMARTSIG:1 Tablet(s) By Mouth Every Evening  . montelukast (SINGULAIR) 10 MG tablet Take 10 mg by mouth daily.  Marland Kitchen nystatin (MYCOSTATIN) 100000 UNIT/ML suspension Take 5 mLs (500,000 Units total) by mouth 4 (four) times daily.  . ondansetron (ZOFRAN ODT) 4 MG disintegrating tablet Take 1 tablet (4 mg total) by mouth every 8 (eight) hours as needed for nausea or vomiting.  . ondansetron (ZOFRAN) 4 MG tablet Take 1 tablet (4 mg total) by mouth every 8 (eight) hours as needed for nausea or vomiting.  . pantoprazole (PROTONIX) 40 MG tablet Take 1 tablet (40 mg total) by mouth 2 (two) times daily.  . phentermine (ADIPEX-P) 37.5 MG tablet Take 1 tablet (37.5 mg total) by mouth daily before breakfast.  . phentermine 37.5 MG capsule Take by mouth.  . sertraline (ZOLOFT) 50 MG tablet Take 1 tablet (50 mg total) by mouth daily.  . tranexamic acid (LYSTEDA) 650 MG TABS tablet Take 2 tablets 3 times a day by oral route.  . Vitamin D, Ergocalciferol, (DRISDOL) 1.25 MG (50000 UNIT) CAPS capsule Take 50,000  Units by mouth once a week.   No facility-administered encounter medications on file as of 02/20/2022.    Past Medical History:  Diagnosis Date  . Allergy   . Asthma   . Hypertension   . Multiple thyroid nodules    per patient   . PONV (postoperative nausea and vomiting)    with D+ C and tonsillectomy   . Pre-diabetes     Past Surgical History:  Procedure Laterality Date  . DILATION AND CURETTAGE OF UTERUS    . LAPAROSCOPIC GASTRIC SLEEVE RESECTION N/A 03/02/2017   Procedure: LAPAROSCOPIC GASTRIC SLEEVE RESECTION, UPPER ENDO;  Surgeon: Luretha Murphy, MD;  Location: [facility];  Service: General;  Laterality: N/A;  . TONSILLECTOMY AND ADENOIDECTOMY      Family History  Problem Relation Age of Onset  . Hyperlipidemia Father   . Diabetes Maternal Grandmother   . Breast cancer Maternal Grandmother   . Heart disease Paternal Grandmother   . Hypertension Paternal Grandmother   . Stroke Other   . Colon cancer Neg Hx     Social History   Socioeconomic History  . Marital status: Married    Spouse name: Not on file  . Number of children: 2  . Years of education: Not on file  . Highest education level: Not on file  Occupational History  . 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  Smoking status: Never  . Smokeless tobacco: Never  Vaping Use  . Vaping Use: Never used  Substance and Sexual Activity  . Alcohol use: No    Alcohol/week: 0.0 standard drinks of alcohol  . Drug use: No  . Sexual activity: Yes    Partners: Male    Birth control/protection: None  Other Topics Concern  . Not on file  Social History Narrative   G4P2  (2 miscarriages)    Household-- pt and 2 children  (2006, 2012)   Social Determinants of Radio broadcast assistant Strain: Not on file  Food Insecurity: Not on file  Transportation Needs: Not on file  Physical Activity: Not on file  Stress: Not on file  Social Connections: Not on file  Intimate Partner Violence: Not  on file    ROS      Objective    BP 131/88   Pulse (!) 102   Temp 98.1 F (36.7 C) (Oral)   Resp 16   Wt (!) 354 lb 9.6 oz (160.8 kg)   SpO2 100%   BMI 60.84 kg/m   Physical Exam  {Labs (Optional):23779}    Assessment & Plan:   Problem List Items Addressed This Visit   None Visit Diagnoses     Acute maxillary sinusitis, recurrence not specified    -  Primary   Relevant Medications   amoxicillin (AMOXIL) 875 MG tablet       No follow-ups on file.   Becky Sax, MD

## 2022-02-24 ENCOUNTER — Encounter: Payer: Self-pay | Admitting: Family Medicine

## 2022-04-18 ENCOUNTER — Ambulatory Visit: Payer: BC Managed Care – PPO | Admitting: Family Medicine

## 2022-05-20 ENCOUNTER — Ambulatory Visit (INDEPENDENT_AMBULATORY_CARE_PROVIDER_SITE_OTHER): Payer: BC Managed Care – PPO | Admitting: Family Medicine

## 2022-05-20 ENCOUNTER — Encounter: Payer: Self-pay | Admitting: Family Medicine

## 2022-05-20 VITALS — BP 133/86 | HR 95 | Temp 99.7°F | Resp 16 | Wt 355.0 lb

## 2022-05-20 DIAGNOSIS — J4531 Mild persistent asthma with (acute) exacerbation: Secondary | ICD-10-CM

## 2022-05-20 DIAGNOSIS — B349 Viral infection, unspecified: Secondary | ICD-10-CM

## 2022-05-20 MED ORDER — BENZONATATE 100 MG PO CAPS: 100.0000 mg | ORAL_CAPSULE | Freq: Two times a day (BID) | ORAL | 0 refills | Status: DC | PRN

## 2022-05-20 MED ORDER — METHYLPREDNISOLONE 4 MG PO TBPK: ORAL_TABLET | Freq: Three times a day (TID) | ORAL | 0 refills | Status: AC

## 2022-05-20 NOTE — Progress Notes (Signed)
Established Patient Office Visit  Subjective    Patient ID: Monica Cantu, female    DOB: [birth year]  Age: 37 y.o. MRN: [id number]  CC: No chief complaint on file.   HPI Monica Cantu presents with complaint of cough with chest congestion for about 3 days. Patient also with sore throat and headache. Reports low grade fever but denies chills.    Outpatient Encounter Medications as of 05/20/2022  Medication Sig   albuterol (VENTOLIN HFA) 108 (90 Base) MCG/ACT inhaler Inhale 1-2 puffs into the lungs every 4 (four) hours as needed for wheezing or shortness of breath.   EPINEPHrine 0.3 mg/0.3 mL IJ SOAJ injection See admin instructions.   FLOVENT HFA 110 MCG/ACT inhaler    fluticasone (FLONASE) 50 MCG/ACT nasal spray 1 spray in each nostril   ibuprofen (ADVIL) 600 MG tablet Take 1 tablet (600 mg total) by mouth every 6 (six) hours as needed.   levocetirizine (XYZAL) 5 MG tablet SMARTSIG:1 Tablet(s) By Mouth Every Evening   montelukast (SINGULAIR) 10 MG tablet Take 10 mg by mouth daily.   nystatin (MYCOSTATIN) 100000 UNIT/ML suspension Take 5 mLs (500,000 Units total) by mouth 4 (four) times daily.   ondansetron (ZOFRAN ODT) 4 MG disintegrating tablet Take 1 tablet (4 mg total) by mouth every 8 (eight) hours as needed for nausea or vomiting.   ondansetron (ZOFRAN) 4 MG tablet Take 1 tablet (4 mg total) by mouth every 8 (eight) hours as needed for nausea or vomiting.   pantoprazole (PROTONIX) 40 MG tablet Take 1 tablet (40 mg total) by mouth 2 (two) times daily.   phentermine (ADIPEX-P) 37.5 MG tablet Take 1 tablet (37.5 mg total) by mouth daily before breakfast.   phentermine 37.5 MG capsule Take by mouth.   sertraline (ZOLOFT) 50 MG tablet Take 1 tablet (50 mg total) by mouth daily.   tranexamic acid (LYSTEDA) 650 MG TABS tablet Take 2 tablets 3 times a day by oral route.   Vitamin D, Ergocalciferol, (DRISDOL) 1.25 MG (50000 UNIT) CAPS capsule Take 50,000 Units by mouth once a  week.   No facility-administered encounter medications on file as of 05/20/2022.    Past Medical History:  Diagnosis Date   Allergy    Asthma    Hypertension    Multiple thyroid nodules    per patient    PONV (postoperative nausea and vomiting)    with D+ C and tonsillectomy    Pre-diabetes     Past Surgical History:  Procedure Laterality Date   DILATION AND CURETTAGE OF UTERUS     LAPAROSCOPIC GASTRIC SLEEVE RESECTION N/A 03/02/2017   Procedure: LAPAROSCOPIC GASTRIC SLEEVE RESECTION, UPPER ENDO;  Surgeon: Johnathan Hausen, MD;  Location: [facility];  Service: General;  Laterality: N/A;   TONSILLECTOMY AND ADENOIDECTOMY      Family History  Problem Relation Age of Onset   Hyperlipidemia Father    Diabetes Maternal Grandmother    Breast cancer Maternal Grandmother    Heart disease Paternal Grandmother    Hypertension Paternal Grandmother    Stroke Other    Colon cancer Neg Hx     Social History   Socioeconomic History   Marital status: Married    Spouse name: Not on file   Number of children: 2   Years of education: Not on file   Highest education level: Not on file  Occupational History   Occupation: Therapist, 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[employer]  Tobacco Use   Smoking status: Never   Smokeless tobacco:  Never  Vaping Use   Vaping Use: Never used  Substance and Sexual Activity   Alcohol use: No    Alcohol/week: 0.0 standard drinks of alcohol   Drug use: No   Sexual activity: Yes    Partners: Male    Birth control/protection: None  Other Topics Concern   Not on file  Social History Narrative   G4P2  (2 miscarriages)    Household-- pt and 2 children  (2006, 2012)   Social Determinants of Radio broadcast assistant Strain: Not on file  Food Insecurity: Not on file  Transportation Needs: Not on file  Physical Activity: Not on file  Stress: Not on file  Social Connections: Not on file  Intimate Partner Violence: Not on file    Review of Systems  Constitutional:   Positive for fever. Negative for chills.  HENT:  Positive for congestion and sore throat.   Respiratory:  Positive for cough.   Neurological:  Positive for headaches.  All other systems reviewed and are negative.       Objective    BP 133/86   Pulse 95   Temp 99.7 F (37.6 C) (Oral)   Resp 16   Wt (!) 355 lb (161 kg)   SpO2 98%   BMI 60.91 kg/m   Physical Exam Vitals and nursing note reviewed.  Constitutional:      General: She is not in acute distress.    Appearance: She is obese.  HENT:     Head: Normocephalic and atraumatic.     Nose: Congestion present.  Eyes:     Conjunctiva/sclera: Conjunctivae normal.  Cardiovascular:     Rate and Rhythm: Normal rate and regular rhythm.  Pulmonary:     Effort: Pulmonary effort is normal.     Breath sounds: Normal breath sounds.  Abdominal:     Palpations: Abdomen is soft.     Tenderness: There is no abdominal tenderness.  Neurological:     General: No focal deficit present.     Mental Status: She is alert and oriented to person, place, and time.         Assessment & Plan:  1. Mild persistent asthma with acute exacerbation Medrol dosepack and tessalon perles prescribed.   2. Viral syndrome Swab results are pending.  - COVID-19, Flu A+B and RSV   No follow-ups on file.   Becky Sax, MD

## 2022-05-21 LAB — COVID-19, FLU A+B AND RSV
Influenza A, NAA: NOT DETECTED
Influenza B, NAA: DETECTED — AB
RSV, NAA: NOT DETECTED
SARS-CoV-2, NAA: NOT DETECTED

## 2022-05-26 ENCOUNTER — Encounter: Payer: Self-pay | Admitting: Family Medicine

## 2022-05-26 ENCOUNTER — Telehealth: Payer: Self-pay | Admitting: *Deleted

## 2022-05-26 NOTE — Telephone Encounter (Signed)
Patient has been calll and RTW has been fax/emailed to employer.

## 2022-07-21 ENCOUNTER — Ambulatory Visit (INDEPENDENT_AMBULATORY_CARE_PROVIDER_SITE_OTHER): Payer: BC Managed Care – PPO | Admitting: Family Medicine

## 2022-07-21 ENCOUNTER — Emergency Department (HOSPITAL_COMMUNITY): Payer: BC Managed Care – PPO

## 2022-07-21 ENCOUNTER — Emergency Department (HOSPITAL_COMMUNITY)
Admission: EM | Admit: 2022-07-21 | Discharge: 2022-07-22 | Disposition: A | Discharge status: Home / Self Care | Payer: BC Managed Care – PPO | Attending: Emergency Medicine | Admitting: Emergency Medicine

## 2022-07-21 ENCOUNTER — Encounter: Payer: Self-pay | Admitting: Family Medicine

## 2022-07-21 VITALS — BP 129/83 | HR 117 | Temp 98.1°F | Resp 16 | Wt 357.0 lb

## 2022-07-21 DIAGNOSIS — R079 Chest pain, unspecified: Secondary | ICD-10-CM

## 2022-07-21 DIAGNOSIS — R0789 Other chest pain: Secondary | ICD-10-CM

## 2022-07-21 DIAGNOSIS — R791 Abnormal coagulation profile: Secondary | ICD-10-CM | POA: Diagnosis not present

## 2022-07-21 DIAGNOSIS — D649 Anemia, unspecified: Secondary | ICD-10-CM | POA: Diagnosis not present

## 2022-07-21 DIAGNOSIS — R002 Palpitations: Secondary | ICD-10-CM | POA: Insufficient documentation

## 2022-07-21 LAB — CBC
HCT: 33.6 % — ABNORMAL LOW (ref 36.0–46.0)
Hemoglobin: 9.5 g/dL — ABNORMAL LOW (ref 12.0–15.0)
MCH: 21 pg — ABNORMAL LOW (ref 26.0–34.0)
MCHC: 28.3 g/dL — ABNORMAL LOW (ref 30.0–36.0)
MCV: 74.2 fL — ABNORMAL LOW (ref 80.0–100.0)
Platelets: 302 10*3/uL (ref 150–400)
RBC: 4.53 MIL/uL (ref 3.87–5.11)
RDW: 19.2 % — ABNORMAL HIGH (ref 11.5–15.5)
WBC: 7.9 10*3/uL (ref 4.0–10.5)
nRBC: 0 % (ref 0.0–0.2)

## 2022-07-21 LAB — BRAIN NATRIURETIC PEPTIDE: B Natriuretic Peptide: 25.1 pg/mL (ref 0.0–100.0)

## 2022-07-21 LAB — COMPREHENSIVE METABOLIC PANEL
ALT: 11 U/L (ref 0–44)
AST: 16 U/L (ref 15–41)
Albumin: 3.4 g/dL — ABNORMAL LOW (ref 3.5–5.0)
Alkaline Phosphatase: 58 U/L (ref 38–126)
Anion gap: 6 (ref 5–15)
BUN: 8 mg/dL (ref 6–20)
CO2: 24 mmol/L (ref 22–32)
Calcium: 9.1 mg/dL (ref 8.9–10.3)
Chloride: 107 mmol/L (ref 98–111)
Creatinine, Ser: 0.88 mg/dL (ref 0.44–1.00)
GFR, Estimated: >60 mL/min (ref >60)
Glucose, Bld: 87 mg/dL (ref 70–99)
Potassium: 3.8 mmol/L (ref 3.5–5.1)
Sodium: 137 mmol/L (ref 135–145)
Total Bilirubin: 0.4 mg/dL (ref 0.3–1.2)
Total Protein: 7.1 g/dL (ref 6.5–8.1)

## 2022-07-21 LAB — I-STAT BETA HCG BLOOD, ED (MC, WL, AP ONLY): I-stat hCG, quantitative: <5 m[IU]/mL (ref <5)

## 2022-07-21 LAB — TROPONIN I (HIGH SENSITIVITY)
Troponin I (High Sensitivity): 3 ng/L (ref <18)
Troponin I (High Sensitivity): 3 ng/L (ref <18)

## 2022-07-21 LAB — D-DIMER, QUANTITATIVE: D-Dimer, Quant: 0.78 ug/mL-FEU — ABNORMAL HIGH (ref 0.00–0.50)

## 2022-07-21 NOTE — ED Provider Triage Note (Cosign Needed Addendum)
Emergency Medicine Provider Triage Evaluation Note  LEANDER FAVRET , a 37 y.o. female  was evaluated in triage.  Pt complains of dull, achy, intermittent chest pain for 2 days. Started on right side of chest now on left sided. Saw PCP this morning who sent to ED for evaluation. She denies SOB, fever, chills, vomiting, diarrhea, leg pain, cough, congestion, leg swelling, or abdominal pain. Mild associated nausea. Other than pain changing location pain is not radiating elsewhere. Denies chance of pregnancy. Family history CAD in grandfather at advanced age. No history of these symptoms. No trauma to chest.   Review of Systems  Positive: See HPI Negative: See HPI  Physical Exam  BP (!) 158/83 (BP Location: Right Arm)   Pulse 84   Temp 99.3 F (37.4 C) (Oral)   Resp 18   SpO2 100%  Gen:   Awake, no distress   Resp:  Normal effort LCTA MSK:   Moves extremities without difficulty no LE edema Other:  Distant heart sounds secondary to body habitus, abdomen soft and nontender  Medical Decision Making  Medically screening exam initiated at 4:59 PM.  Appropriate orders placed.  LELLA SARIC was informed that the remainder of the evaluation will be completed by another provider, this initial triage assessment does not replace that evaluation, and the importance of remaining in the ED until their evaluation is complete.     Tonette Lederer, PA-C 07/21/22 1702    Tonette Lederer, PA-C 07/21/22 1703

## 2022-07-21 NOTE — Progress Notes (Signed)
.  chest pain x 3 days - denies SOB -denies NV - LOC

## 2022-07-21 NOTE — ED Triage Notes (Signed)
Patient complains of chest pain, described as an ache, that started two days ago. Patient denies shortness of breath, denies dizziness. Is alert, oriented, ambulating independently with steady gait and is in no apparent distress at this time.

## 2022-07-21 NOTE — ED Provider Notes (Signed)
[facility]ENCY DEPT [facility] Emergency Department Provider Note MRN:  [id number]  Arrival date & time: 07/22/22     Chief Complaint   Chest Pain   History of Present Illness   Monica Cantu is a 37 y.o. year-old female presents to the ED with chief complaint of chest pain.  Describes it as a dull ache on the right side.  Reports some palpitations.  States that she saw her doctor today and was referred to the ER.  She states that the symptoms started about 3 days ago.  States that symptoms have been intermittent.  States that she feels worse when she is lying down.  Denies fever, chills, or cough. Still having symptoms now. Denies hx of PE or DVT.  Denies recent long travel, immobilization, or surgery.  Marland Kitchen  History provided by patient.   Review of Systems  Pertinent positive and negative review of systems noted in HPI.    Physical Exam   Vitals:   07/21/22 2332 07/22/22 0154  BP: 126/68 (!) 117/51  Pulse: 74 71  Resp: 16 16  Temp: 97.8 F (36.6 C)   SpO2: 100% 100%    CONSTITUTIONAL:  well-appearing, NAD NEURO:  Alert and oriented x 3, CN 3-12 grossly intact EYES:  eyes equal and reactive ENT/NECK:  Supple, no stridor  CARDIO:  normal rate, regular rhythm, appears well-perfused  PULM:  No respiratory distress,  GI/GU:  non-distended,  MSK/SPINE:  No gross deformities, no edema, moves all extremities  SKIN:  no rash, atraumatic   *Additional and/or pertinent findings included in MDM below  Diagnostic and Interventional Summary    EKG Interpretation  Date/Time:  Monday July 21 2022 16:47:01 EDT Ventricular Rate:  92 PR Interval:  154 QRS Duration: 82 QT Interval:  318 QTC Calculation: 393 R Axis:   73 Text Interpretation: Sinus rhythm with marked sinus arrhythmia Otherwise normal ECG When compared with ECG of 22-Oct-2020 10:17, PREVIOUS ECG IS PRESENT No acute changes Confirmed by Derwood Kaplan [phone]) on 07/21/2022 9:32:46 PM       Labs Reviewed   CBC - Abnormal; Notable for the following components:      Result Value   Hemoglobin 9.5 (*)    HCT 33.6 (*)    MCV 74.2 (*)    MCH 21.0 (*)    MCHC 28.3 (*)    RDW 19.2 (*)    All other components within normal limits  COMPREHENSIVE METABOLIC PANEL - Abnormal; Notable for the following components:   Albumin 3.4 (*)    All other components within normal limits  D-DIMER, QUANTITATIVE - Abnormal; Notable for the following components:   D-Dimer, Quant 0.78 (*)    All other components within normal limits  BRAIN NATRIURETIC PEPTIDE  I-STAT BETA HCG BLOOD, ED ([facility], [facility], [facility] ONLY)  TROPONIN I (HIGH SENSITIVITY)  TROPONIN I (HIGH SENSITIVITY)    CT Angio Chest PE W and/or Wo Contrast  Final Result    DG Chest 2 View  Final Result      Medications  iohexol (OMNIPAQUE) 350 MG/ML injection 75 mL (75 mLs Intravenous Contrast Given 07/22/22 0113)     Procedures  /  Critical Care Procedures  ED Course and Medical Decision Making  I have reviewed the triage vital signs, the nursing notes, and pertinent available records from the EMR.  Social Determinants Affecting Complexity of Care: Patient has no clinically significant social determinants affecting this chief complaint..   ED Course:    Medical Decision Making  Patient with dull aching chest pain for about the past 3 days.  Symptoms been intermittent.  Worse when she lies down.  Will check labs and imaging.  Labs and imaging are reassuring.  Will have patient follow-up with cardiology.  Recommend OTC Tylenol and NSAIDs.  If symptoms change or worsen, return to the emergency department.  Amount and/or Complexity of Data Reviewed Labs: ordered.    Details: Troponin is 3, repeat is also 3, doubt ACS D-dimer is slightly elevated at 0.78, will proceed with CT No significant electrolyte derangement No leukocytosis Mild anemia at 9.5, we discussed this and I recommended iron supplements at home Radiology: ordered and independent  interpretation performed.    Details: No large PE seen ECG/medicine tests: independent interpretation performed.    Details: No acute ischemic changes  Risk Prescription drug management.     Consultants: No consultations were needed in caring for this patient.   Treatment and Plan: I considered admission due to patient's initial presentation, but after considering the examination and diagnostic results, patient will not require admission and can be discharged with outpatient follow-up.    Final Clinical Impressions(s) / ED Diagnoses     ICD-10-CM   1. Nonspecific chest pain  R07.9 Ambulatory referral to Cardiology      ED Discharge Orders          Ordered    Ambulatory referral to Cardiology       Comments: If you have not heard from the Cardiology office within the next 72 hours please call [phone].   07/22/22 0139              Discharge Instructions Discussed with and Provided to Patient:     Discharge Instructions      Take Tylenol or Motrin for pain.  If symptoms change or worsen, please return to the ER.       Roxy Horseman, PA-C 07/22/22 6004    Derwood Kaplan, MD 07/23/22 0002

## 2022-07-22 ENCOUNTER — Emergency Department (HOSPITAL_COMMUNITY): Payer: BC Managed Care – PPO

## 2022-07-22 ENCOUNTER — Encounter (HOSPITAL_COMMUNITY): Payer: Self-pay

## 2022-07-22 DIAGNOSIS — R079 Chest pain, unspecified: Secondary | ICD-10-CM | POA: Diagnosis not present

## 2022-07-22 MED ORDER — IOHEXOL 350 MG/ML SOLN
75.0000 mL | Freq: Once | INTRAVENOUS | Status: AC | PRN
  Administered 2022-07-22: 75 mL via INTRAVENOUS

## 2022-07-22 NOTE — Discharge Instructions (Signed)
Take Tylenol or Motrin for pain.  If symptoms change or worsen, please return to the ER.

## 2022-07-28 ENCOUNTER — Encounter: Payer: Self-pay | Admitting: Family Medicine

## 2022-07-28 NOTE — Progress Notes (Signed)
Established Patient Office Visit  Subjective    Patient ID: Monica Cantu, female    DOB: [birth year]  Age: 37 y.o. MRN: [id number]  CC:  Chief Complaint  Patient presents with   Chest Pain    HPI Monica Cantu presents with atypical chest pain for now going on three days. It is intermittent. Denies SOB, N/V. Denies fever/chills.    Outpatient Encounter Medications as of 07/21/2022  Medication Sig   albuterol (VENTOLIN HFA) 108 (90 Base) MCG/ACT inhaler Inhale 1-2 puffs into the lungs every 4 (four) hours as needed for wheezing or shortness of breath.   benzonatate (TESSALON) 100 MG capsule Take 1 capsule (100 mg total) by mouth 2 (two) times daily as needed for cough.   EPINEPHrine 0.3 mg/0.3 mL IJ SOAJ injection See admin instructions.   FLOVENT HFA 110 MCG/ACT inhaler    fluticasone (FLONASE) 50 MCG/ACT nasal spray 1 spray in each nostril   ibuprofen (ADVIL) 600 MG tablet Take 1 tablet (600 mg total) by mouth every 6 (six) hours as needed.   levocetirizine (XYZAL) 5 MG tablet SMARTSIG:1 Tablet(s) By Mouth Every Evening   montelukast (SINGULAIR) 10 MG tablet Take 10 mg by mouth daily.   nystatin (MYCOSTATIN) 100000 UNIT/ML suspension Take 5 mLs (500,000 Units total) by mouth 4 (four) times daily.   ondansetron (ZOFRAN ODT) 4 MG disintegrating tablet Take 1 tablet (4 mg total) by mouth every 8 (eight) hours as needed for nausea or vomiting.   ondansetron (ZOFRAN) 4 MG tablet Take 1 tablet (4 mg total) by mouth every 8 (eight) hours as needed for nausea or vomiting.   pantoprazole (PROTONIX) 40 MG tablet Take 1 tablet (40 mg total) by mouth 2 (two) times daily.   phentermine (ADIPEX-P) 37.5 MG tablet Take 1 tablet (37.5 mg total) by mouth daily before breakfast.   phentermine 37.5 MG capsule Take by mouth.   sertraline (ZOLOFT) 50 MG tablet Take 1 tablet (50 mg total) by mouth daily.   tranexamic acid (LYSTEDA) 650 MG TABS tablet Take 2 tablets 3 times a day by oral route.    Vitamin D, Ergocalciferol, (DRISDOL) 1.25 MG (50000 UNIT) CAPS capsule Take 50,000 Units by mouth once a week.   No facility-administered encounter medications on file as of 07/21/2022.    Past Medical History:  Diagnosis Date   Allergy    Asthma    Hypertension    Multiple thyroid nodules    per patient    PONV (postoperative nausea and vomiting)    with D+ C and tonsillectomy    Pre-diabetes     Past Surgical History:  Procedure Laterality Date   DILATION AND CURETTAGE OF UTERUS     LAPAROSCOPIC GASTRIC SLEEVE RESECTION N/A 03/02/2017   Procedure: LAPAROSCOPIC GASTRIC SLEEVE RESECTION, UPPER ENDO;  Surgeon: Luretha Murphy, MD;  Location: [facility];  Service: General;  Laterality: N/A;   TONSILLECTOMY AND ADENOIDECTOMY      Family History  Problem Relation Age of Onset   Hyperlipidemia Father    Diabetes Maternal Grandmother    Breast cancer Maternal Grandmother    Heart disease Paternal Grandmother    Hypertension Paternal Grandmother    Stroke Other    Colon cancer Neg Hx     Social History   Socioeconomic History   Marital status: Married    Spouse name: Not on file   Number of children: 2   Years of education: Not on file   Highest education level: Not on  file  Occupational History   Occupation: Personnel officer fro [employer]  Tobacco Use   Smoking status: Never   Smokeless tobacco: Never  Vaping Use   Vaping Use: Never used  Substance and Sexual Activity   Alcohol use: No    Alcohol/week: 0.0 standard drinks of alcohol   Drug use: No   Sexual activity: Yes    Partners: Male    Birth control/protection: None  Other Topics Concern   Not on file  Social History Narrative   G4P2  (2 miscarriages)    Household-- pt and 2 children  (2006, 2012)   Social Determinants of Corporate investment banker Strain: Not on file  Food Insecurity: Not on file  Transportation Needs: Not on file  Physical Activity: Not on file  Stress: Not on file  Social  Connections: Not on file  Intimate Partner Violence: Not on file    Review of Systems  Constitutional:  Negative for chills and fever.  Respiratory:  Negative for cough, shortness of breath and wheezing.   Cardiovascular:  Positive for chest pain.  All other systems reviewed and are negative.       Objective    BP 129/83   Pulse (!) 117   Temp 98.1 F (36.7 C) (Oral)   Resp 16   Wt (!) 357 lb (161.9 kg)   SpO2 98%   BMI 61.25 kg/m   Physical Exam Vitals and nursing note reviewed.  Constitutional:      General: She is not in acute distress.    Appearance: She is obese.  HENT:     Head: Normocephalic and atraumatic.  Eyes:     Conjunctiva/sclera: Conjunctivae normal.  Cardiovascular:     Rate and Rhythm: Normal rate and regular rhythm.  Pulmonary:     Effort: Pulmonary effort is normal.     Breath sounds: Normal breath sounds.  Abdominal:     Palpations: Abdomen is soft.     Tenderness: There is no abdominal tenderness.  Neurological:     General: No focal deficit present.     Mental Status: She is alert and oriented to person, place, and time.         Assessment & Plan:   1. Atypical chest pain Patient referred to ED for further urgent eval/mgt. Patient appeared stable for transport by PV. Patient v.u. - EKG 12-Lead    Return if symptoms worsen or fail to improve.   [name], MD

## 2022-08-07 ENCOUNTER — Telehealth (INDEPENDENT_AMBULATORY_CARE_PROVIDER_SITE_OTHER): Payer: BC Managed Care – PPO | Admitting: Family Medicine

## 2022-08-07 DIAGNOSIS — R300 Dysuria: Secondary | ICD-10-CM

## 2022-08-07 MED ORDER — NITROFURANTOIN MONOHYD MACRO 100 MG PO CAPS: 100.0000 mg | ORAL_CAPSULE | Freq: Two times a day (BID) | ORAL | 0 refills | Status: DC

## 2022-08-12 ENCOUNTER — Encounter: Payer: Self-pay | Admitting: Family Medicine

## 2022-08-12 NOTE — Progress Notes (Signed)
Virtual Visit via Video Note  I connected with Monica Cantu on 08/12/22 at 10:40 AM EDT by a video enabled telemedicine application and verified that I am speaking with the correct person using two identifiers.  Location: Patient: Monica Cantu Provider: [facility]   I discussed the limitations of evaluation and management by telemedicine and the availability of in person appointments. The patient expressed understanding and agreed to proceed.  History of Present Illness: Patient reports dysuria for about 3-5 days. She has taken a test OTC which indicates probable UTI   Observations/Objective:   Assessment and Plan: 1. Dysuria Macrobid prescribed.   Follow Up Instructions:    I discussed the assessment and treatment plan with the patient. The patient was provided an opportunity to ask questions and all were answered. The patient agreed with the plan and demonstrated an understanding of the instructions.   The patient was advised to call back or seek an in-person evaluation if the symptoms worsen or if the condition fails to improve as anticipated.  I provided 7 minutes of non-face-to-face time during this encounter.   [name], MD

## 2022-08-24 NOTE — Progress Notes (Deleted)
  Cardiology Office Note:   Date:  08/24/2022  ID:  Monica Cantu, DOB 1986-01-17, MRN [id number]  History of Present Illness:   Monica Cantu is a 37 y.o. female HTN who was referred by Roxy Horseman, PA for further evaluation of chest pain.   Patient presented to the ER on 07/2022 with chest pain. Notes reviewed. Had a dull ache in her right chest that was worse with laying down. Trop negative. BNP normal. D-dimer negative. ECG with no ischemic changes. Given reassuring work-up, she is now referred to Cardiology for further evaluation.  Past Medical History:  Diagnosis Date   Allergy    Asthma    Chest pain    Hypertension    Multiple thyroid nodules    per patient    PONV (postoperative nausea and vomiting)    with D+ C and tonsillectomy    Pre-diabetes      ROS: ***  Studies Reviewed:    EKG:  ***       Risk Assessment/Calculations:   {Does this patient have ATRIAL FIBRILLATION?:[id number]} No BP recorded.  {Refresh Note OR Click here to enter BP  :1}***        Physical Exam:   VS:  There were no vitals taken for this visit.   Wt Readings from Last 3 Encounters:  07/21/22 (!) 357 lb (161.9 kg)  05/20/22 (!) 355 lb (161 kg)  02/20/22 (!) 354 lb 9.6 oz (160.8 kg)     GEN: Well nourished, well developed in no acute distress NECK: No JVD; No carotid bruits CARDIAC: ***RRR, no murmurs, rubs, gallops RESPIRATORY:  Clear to auscultation without rales, wheezing or rhonchi  ABDOMEN: Soft, non-tender, non-distended EXTREMITIES:  No edema; No deformity   ASSESSMENT AND PLAN:   #Chest Pain: -Atypical pain with reassuring work-up in ER -Will check ETT and TTE for further evaluation    {Are you ordering a CV Procedure (e.g. stress test, cath, DCCV, TEE, etc)?   Press F2        :[id number]}   Signed, Meriam Sprague, MD

## 2022-08-26 ENCOUNTER — Ambulatory Visit: Payer: BC Managed Care – PPO | Attending: Cardiology | Admitting: Cardiology

## 2022-08-27 ENCOUNTER — Encounter: Payer: Self-pay | Admitting: Cardiology

## 2022-10-01 DIAGNOSIS — Z124 Encounter for screening for malignant neoplasm of cervix: Secondary | ICD-10-CM | POA: Diagnosis not present

## 2022-10-01 DIAGNOSIS — N920 Excessive and frequent menstruation with regular cycle: Secondary | ICD-10-CM | POA: Diagnosis not present

## 2022-10-01 DIAGNOSIS — N898 Other specified noninflammatory disorders of vagina: Secondary | ICD-10-CM | POA: Diagnosis not present

## 2022-10-01 DIAGNOSIS — Z1151 Encounter for screening for human papillomavirus (HPV): Secondary | ICD-10-CM | POA: Diagnosis not present

## 2022-10-01 DIAGNOSIS — D259 Leiomyoma of uterus, unspecified: Secondary | ICD-10-CM | POA: Diagnosis not present

## 2022-10-01 DIAGNOSIS — L680 Hirsutism: Secondary | ICD-10-CM | POA: Diagnosis not present

## 2022-10-01 DIAGNOSIS — Z01419 Encounter for gynecological examination (general) (routine) without abnormal findings: Secondary | ICD-10-CM | POA: Diagnosis not present

## 2022-10-01 DIAGNOSIS — R102 Pelvic and perineal pain: Secondary | ICD-10-CM | POA: Diagnosis not present

## 2022-10-02 ENCOUNTER — Other Ambulatory Visit: Payer: Self-pay | Admitting: Nurse Practitioner

## 2022-10-02 DIAGNOSIS — N920 Excessive and frequent menstruation with regular cycle: Secondary | ICD-10-CM

## 2022-10-02 DIAGNOSIS — D649 Anemia, unspecified: Secondary | ICD-10-CM

## 2022-10-02 DIAGNOSIS — D219 Benign neoplasm of connective and other soft tissue, unspecified: Secondary | ICD-10-CM

## 2022-10-06 ENCOUNTER — Encounter (HOSPITAL_COMMUNITY): Payer: Self-pay | Admitting: *Deleted

## 2022-10-17 ENCOUNTER — Inpatient Hospital Stay: Admission: RE | Admit: 2022-10-17 | Payer: BC Managed Care – PPO | Source: Ambulatory Visit

## 2022-10-21 DIAGNOSIS — D500 Iron deficiency anemia secondary to blood loss (chronic): Secondary | ICD-10-CM | POA: Diagnosis not present

## 2022-10-21 DIAGNOSIS — N920 Excessive and frequent menstruation with regular cycle: Secondary | ICD-10-CM | POA: Diagnosis not present

## 2022-10-21 DIAGNOSIS — L680 Hirsutism: Secondary | ICD-10-CM | POA: Diagnosis not present

## 2022-10-21 DIAGNOSIS — N979 Female infertility, unspecified: Secondary | ICD-10-CM | POA: Diagnosis not present

## 2022-10-24 ENCOUNTER — Ambulatory Visit
Admission: RE | Admit: 2022-10-24 | Discharge: 2022-10-24 | Disposition: A | Discharge status: Home / Self Care | Payer: BC Managed Care – PPO | Source: Ambulatory Visit | Attending: Nurse Practitioner | Admitting: Nurse Practitioner

## 2022-10-24 DIAGNOSIS — D219 Benign neoplasm of connective and other soft tissue, unspecified: Secondary | ICD-10-CM

## 2022-10-24 DIAGNOSIS — N920 Excessive and frequent menstruation with regular cycle: Secondary | ICD-10-CM

## 2022-10-24 DIAGNOSIS — D649 Anemia, unspecified: Secondary | ICD-10-CM

## 2022-10-24 DIAGNOSIS — D259 Leiomyoma of uterus, unspecified: Secondary | ICD-10-CM | POA: Diagnosis not present

## 2022-10-27 DIAGNOSIS — E161 Other hypoglycemia: Secondary | ICD-10-CM | POA: Diagnosis not present

## 2022-10-27 DIAGNOSIS — Z3202 Encounter for pregnancy test, result negative: Secondary | ICD-10-CM | POA: Diagnosis not present

## 2022-10-27 DIAGNOSIS — R87810 Cervical high risk human papillomavirus (HPV) DNA test positive: Secondary | ICD-10-CM | POA: Diagnosis not present

## 2022-10-31 ENCOUNTER — Other Ambulatory Visit: Payer: Self-pay | Admitting: Family Medicine

## 2022-10-31 MED ORDER — DIAZEPAM 10 MG PO TABS
ORAL_TABLET | ORAL | 0 refills | Status: DC
Start: 2022-10-31 — End: 2023-07-09

## 2022-12-24 IMAGING — MR MR FEMUR*L* W/O CM
4 of 7 series · 20 of 40 positions shown · non-contrast
Comparison: None.

CLINICAL DATA: Posterior left thigh pain since injury while
exercising on 05/15/2020

EXAM:
MR OF THE LEFT FEMUR WITHOUT CONTRAST
TECHNIQUE: Multiplanar, multisequence MR imaging of the left femur was
performed. No intravenous contrast was administered.

[Series 5: T1 · coronal · left · 3.0mm · 1.56mm/px · 4 of 60 slices shown]
[im 1/60]
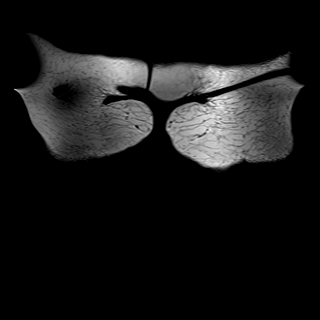
[im 9/60]
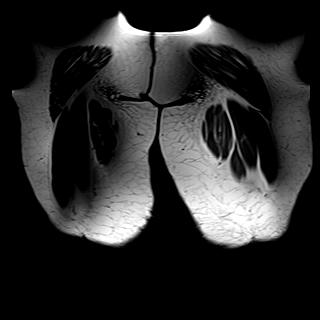
[im 34/60]
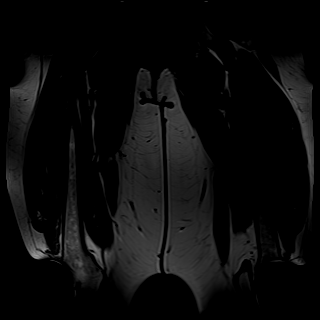
[im 51/60]
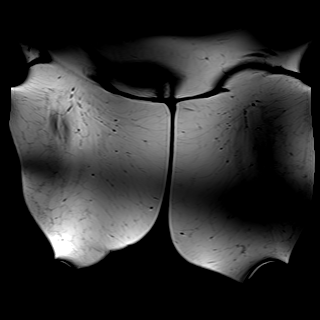

[Series 9: T2 fat-sat · axial · left · 5.0mm · 0.49mm/px · z∈[-228,-12]mm · 4 of 37 slices shown (1 of 3)]
[im 1/37]
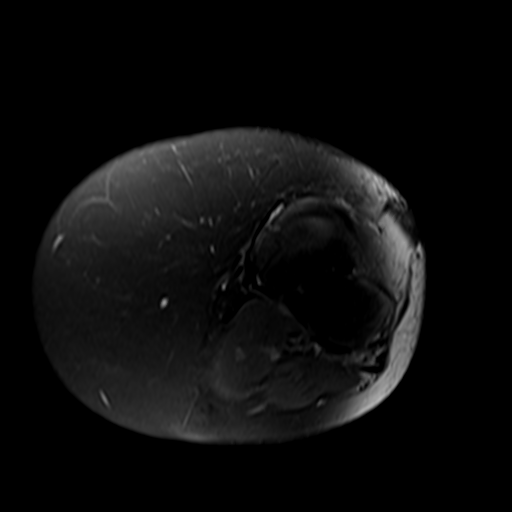
[im 13/37]
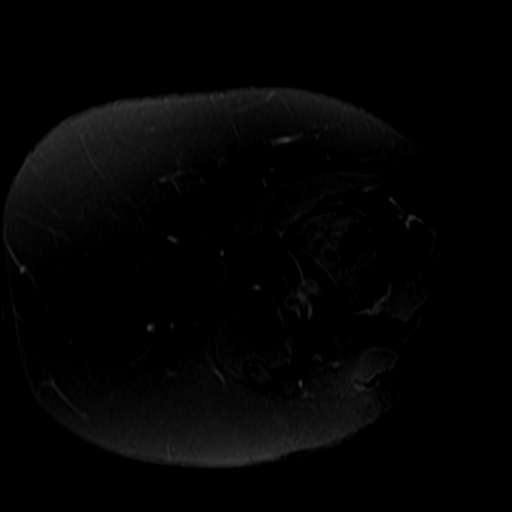
[im 25/37]
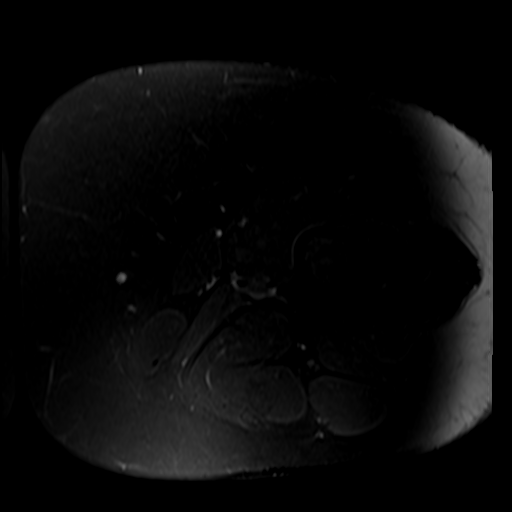
[im 37/37]
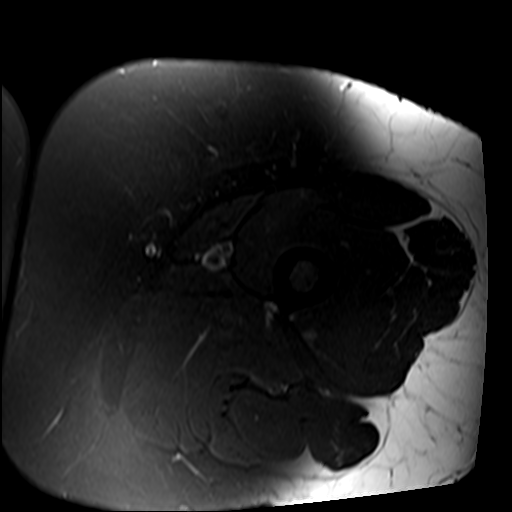

[Series 11: T2 fat-sat · axial · left · 5.0mm · 0.49mm/px · z∈[-43,+221]mm · 5 of 45 slices shown (2 of 3)]
[im 1/45]
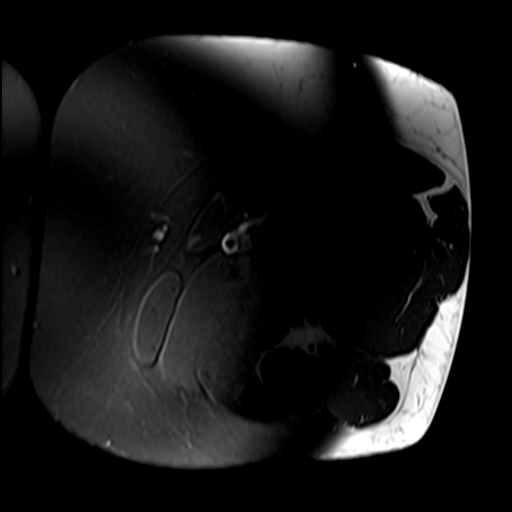
[im 12/45]
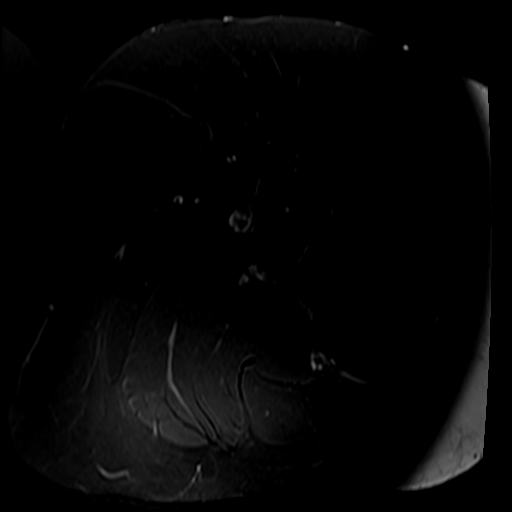
[im 23/45]
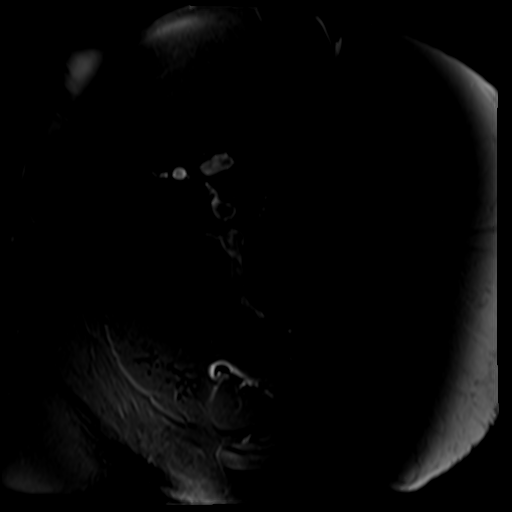
[im 34/45]
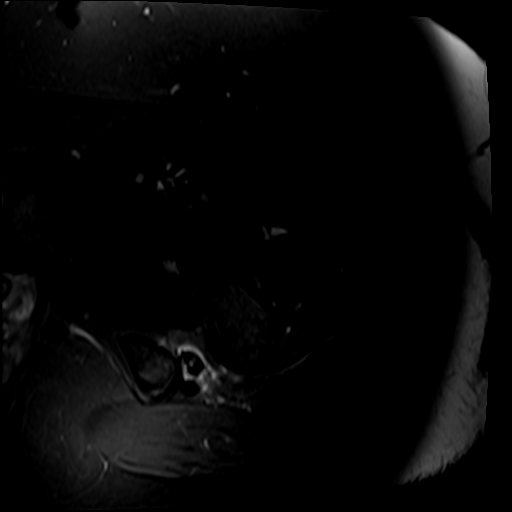
[im 45/45]
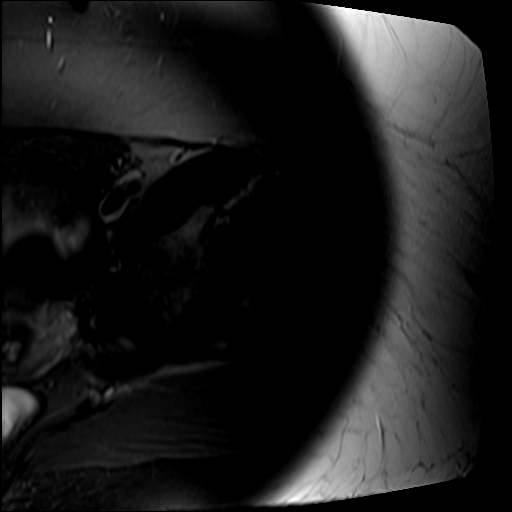

[Series 12: T2 fat-sat · sagittal · left · 3.0mm · 0.70mm/px · 7 of 57 slices shown (3 of 3)]
[im 1/57]
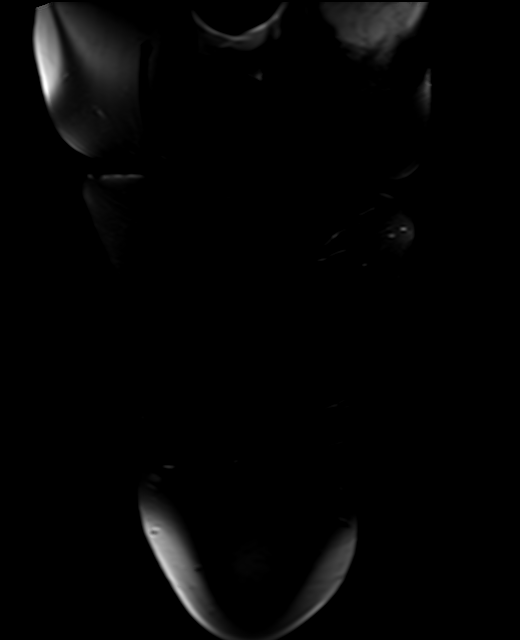
[im 10/57]
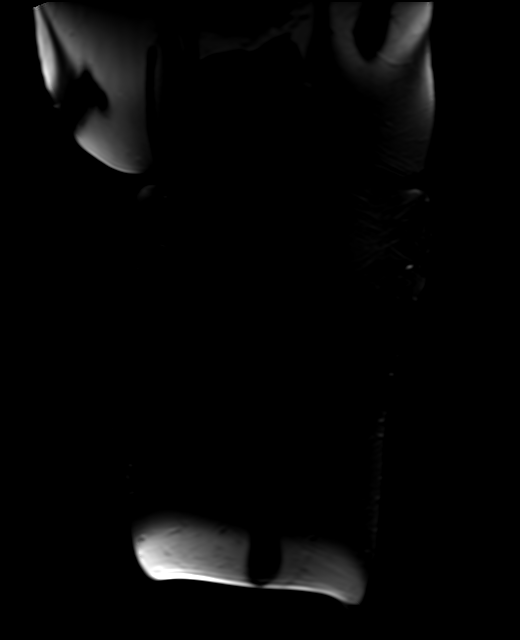
[im 19/57]
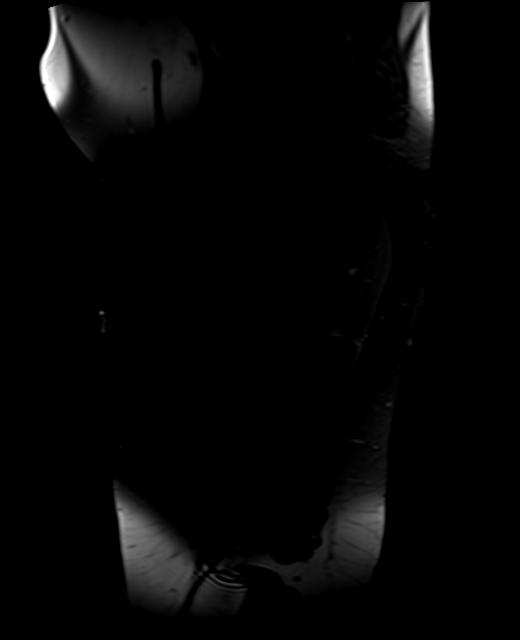
[im 29/57]
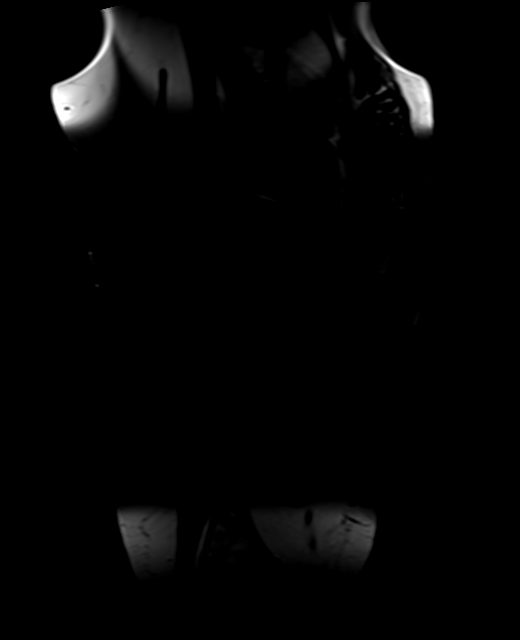
[im 38/57]
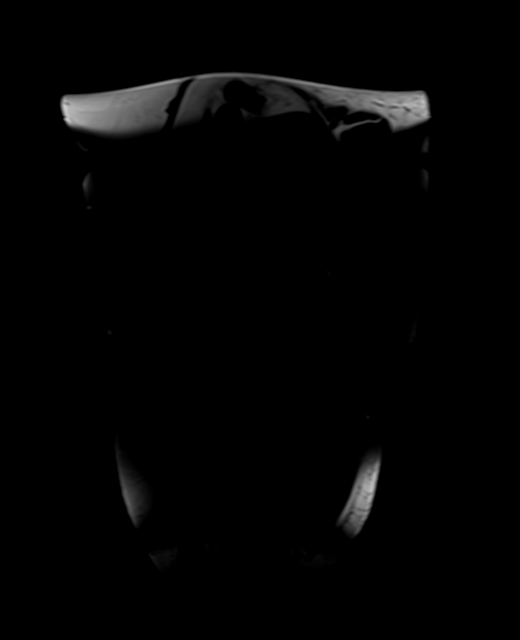
[im 47/57]
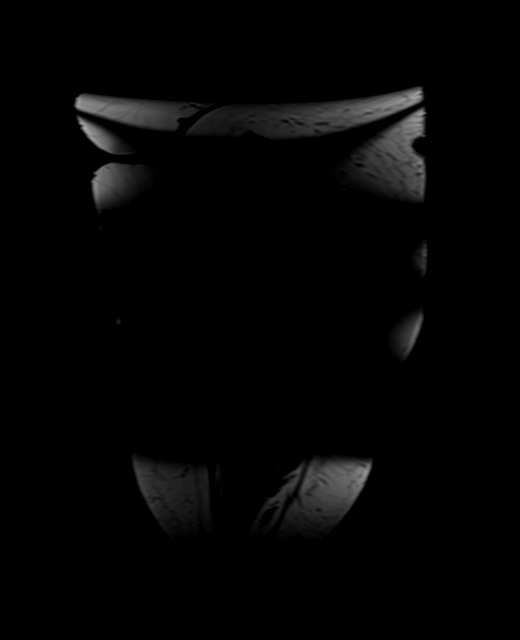
[im 57/57]
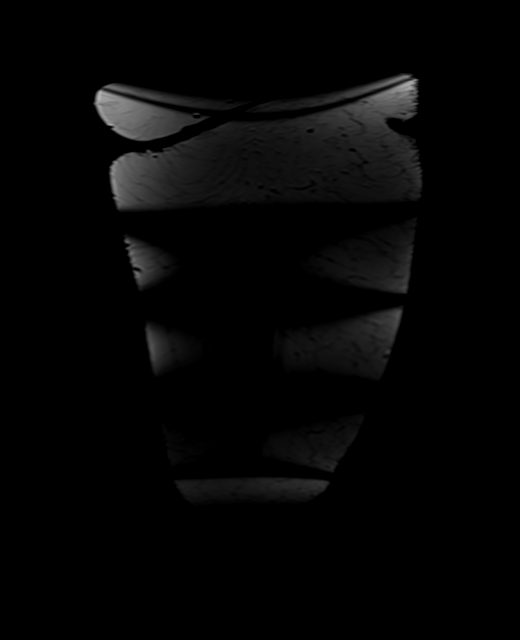

[20 of 40 positions shown; findings below may reference images not displayed]

FINDINGS: Bones/Joint/Cartilage

No acute fracture. No dislocation. No femoral head avascular
necrosis. No suspicious bone lesion. No significant arthropathy of
the left hip joint. No hip joint effusion. 3 mm full-thickness
cartilage defect at the patellar apex with underlying subchondral
marrow edema (series 9, image 30). No knee joint effusion.

Ligaments

Intact.

Muscles and Tendons

High-grade partial thickness incomplete tear of the semimembranosus
tendon origin on the left ischial tuberosity (series 11, images
8-12). No retraction. There is surrounding edema and a small amount
of fluid tracking to the proximal myotendinous junction. Biceps
femoris and semitendinosus tendon origins appear intact. Remaining
tendinous structures appear intact. Preserved muscle bulk and signal
intensity. No intramuscular fluid collection.

Soft tissues

No soft tissue fluid collection or hematoma. No left inguinal
lymphadenopathy.
IMPRESSION: 1. High-grade partial thickness non-retracted tear of the
semimembranosus tendon origin on the left ischial tuberosity.
2. Focal 3 mm full-thickness cartilage defect at the patellar apex
with underlying subchondral marrow edema.

## 2023-02-11 ENCOUNTER — Ambulatory Visit (INDEPENDENT_AMBULATORY_CARE_PROVIDER_SITE_OTHER): Payer: BC Managed Care – PPO | Admitting: Family Medicine

## 2023-02-11 ENCOUNTER — Encounter: Payer: Self-pay | Admitting: Family Medicine

## 2023-02-11 VITALS — BP 133/93 | HR 92 | Temp 97.7°F | Resp 16 | Wt 338.0 lb

## 2023-02-11 DIAGNOSIS — R7303 Prediabetes: Secondary | ICD-10-CM | POA: Diagnosis not present

## 2023-02-11 DIAGNOSIS — M2550 Pain in unspecified joint: Secondary | ICD-10-CM | POA: Diagnosis not present

## 2023-02-11 DIAGNOSIS — D509 Iron deficiency anemia, unspecified: Secondary | ICD-10-CM | POA: Diagnosis not present

## 2023-02-11 DIAGNOSIS — F329 Major depressive disorder, single episode, unspecified: Secondary | ICD-10-CM

## 2023-02-12 ENCOUNTER — Other Ambulatory Visit: Payer: Self-pay | Admitting: Family Medicine

## 2023-02-12 ENCOUNTER — Encounter: Payer: Self-pay | Admitting: Family Medicine

## 2023-02-12 DIAGNOSIS — M2550 Pain in unspecified joint: Secondary | ICD-10-CM

## 2023-02-12 LAB — ARTHRITIS PANEL
Basophils Absolute: 0.1 10*3/uL (ref 0.0–0.2)
Basos: 2 %
EOS (ABSOLUTE): 0.2 10*3/uL (ref 0.0–0.4)
Eos: 3 %
Hematocrit: 40 % (ref 34.0–46.6)
Hemoglobin: 11.4 g/dL (ref 11.1–15.9)
Immature Grans (Abs): 0 10*3/uL (ref 0.0–0.1)
Immature Granulocytes: 0 %
Lymphocytes Absolute: 3.2 10*3/uL — ABNORMAL HIGH (ref 0.7–3.1)
Lymphs: 41 %
MCH: 21.2 pg — ABNORMAL LOW (ref 26.6–33.0)
MCHC: 28.5 g/dL — ABNORMAL LOW (ref 31.5–35.7)
MCV: 75 fL — ABNORMAL LOW (ref 79–97)
Monocytes Absolute: 0.6 10*3/uL (ref 0.1–0.9)
Monocytes: 8 %
Neutrophils Absolute: 3.6 10*3/uL (ref 1.4–7.0)
Neutrophils: 46 %
Platelets: 292 10*3/uL (ref 150–450)
RBC: 5.37 x10E6/uL — ABNORMAL HIGH (ref 3.77–5.28)
RDW: 17.7 % — ABNORMAL HIGH (ref 11.7–15.4)
Rheumatoid fact SerPl-aCnc: <10 [IU]/mL (ref <14.0)
Sed Rate: 79 mm/h — ABNORMAL HIGH (ref 0–32)
Uric Acid: 6.7 mg/dL — ABNORMAL HIGH (ref 2.6–6.2)
WBC: 7.7 10*3/uL (ref 3.4–10.8)

## 2023-02-12 LAB — CMP14+EGFR
ALT: 13 [IU]/L (ref 0–32)
AST: 15 [IU]/L (ref 0–40)
Albumin: 4.5 g/dL (ref 3.9–4.9)
Alkaline Phosphatase: 73 [IU]/L (ref 44–121)
BUN/Creatinine Ratio: 13 (ref 9–23)
BUN: 11 mg/dL (ref 6–20)
Bilirubin Total: 0.3 mg/dL (ref 0.0–1.2)
CO2: 19 mmol/L — ABNORMAL LOW (ref 20–29)
Calcium: 9.4 mg/dL (ref 8.7–10.2)
Chloride: 107 mmol/L — ABNORMAL HIGH (ref 96–106)
Creatinine, Ser: 0.86 mg/dL (ref 0.57–1.00)
Globulin, Total: 2.9 g/dL (ref 1.5–4.5)
Glucose: 72 mg/dL (ref 70–99)
Potassium: 4.1 mmol/L (ref 3.5–5.2)
Sodium: 142 mmol/L (ref 134–144)
Total Protein: 7.4 g/dL (ref 6.0–8.5)
eGFR: 88 mL/min/{1.73_m2} (ref >59)

## 2023-02-12 NOTE — Progress Notes (Signed)
Established Patient Office Visit  Subjective    Patient ID: Monica Cantu, female    DOB: 08-Mar-1986  Age: 37 y.o. MRN: [id number]  CC:  Chief Complaint  Patient presents with   Generalized Body Aches    HPI Monica Cantu presents with complaint of persistent intermittent pains of her joints with some fatigue. She denies fever/chills or viral sx. She has not taken meds for sx. She denies known trauma or injuries. She also reports increased social stressors.  Outpatient Encounter Medications as of 02/11/2023  Medication Sig   albuterol (VENTOLIN HFA) 108 (90 Base) MCG/ACT inhaler Inhale 1-2 puffs into the lungs every 4 (four) hours as needed for wheezing or shortness of breath.   benzonatate (TESSALON) 100 MG capsule Take 1 capsule (100 mg total) by mouth 2 (two) times daily as needed for cough.   diazepam (VALIUM) 10 MG tablet Take as needed 30-60 minutes before event (airplane travel)   EPINEPHrine 0.3 mg/0.3 mL IJ SOAJ injection See admin instructions.   FLOVENT HFA 110 MCG/ACT inhaler    fluticasone (FLONASE) 50 MCG/ACT nasal spray 1 spray in each nostril   ibuprofen (ADVIL) 600 MG tablet Take 1 tablet (600 mg total) by mouth every 6 (six) hours as needed.   levocetirizine (XYZAL) 5 MG tablet SMARTSIG:1 Tablet(s) By Mouth Every Evening   montelukast (SINGULAIR) 10 MG tablet Take 10 mg by mouth daily.   nitrofurantoin, macrocrystal-monohydrate, (MACROBID) 100 MG capsule Take 1 capsule (100 mg total) by mouth 2 (two) times daily.   nystatin (MYCOSTATIN) 100000 UNIT/ML suspension Take 5 mLs (500,000 Units total) by mouth 4 (four) times daily.   ondansetron (ZOFRAN ODT) 4 MG disintegrating tablet Take 1 tablet (4 mg total) by mouth every 8 (eight) hours as needed for nausea or vomiting.   ondansetron (ZOFRAN) 4 MG tablet Take 1 tablet (4 mg total) by mouth every 8 (eight) hours as needed for nausea or vomiting.   pantoprazole (PROTONIX) 40 MG tablet Take 1 tablet (40 mg total)  by mouth 2 (two) times daily.   phentermine (ADIPEX-P) 37.5 MG tablet Take 1 tablet (37.5 mg total) by mouth daily before breakfast.   phentermine 37.5 MG capsule Take by mouth.   sertraline (ZOLOFT) 50 MG tablet Take 1 tablet (50 mg total) by mouth daily.   tranexamic acid (LYSTEDA) 650 MG TABS tablet Take 2 tablets 3 times a day by oral route.   Vitamin D, Ergocalciferol, (DRISDOL) 1.25 MG (50000 UNIT) CAPS capsule Take 50,000 Units by mouth once a week.   No facility-administered encounter medications on file as of 02/11/2023.    Past Medical History:  Diagnosis Date   Allergy    Asthma    Chest pain    Hypertension    Multiple thyroid nodules    per patient    PONV (postoperative nausea and vomiting)    with D+ C and tonsillectomy    Pre-diabetes     Past Surgical History:  Procedure Laterality Date   DILATION AND CURETTAGE OF UTERUS     LAPAROSCOPIC GASTRIC SLEEVE RESECTION N/A 03/02/2017   Procedure: LAPAROSCOPIC GASTRIC SLEEVE RESECTION, UPPER ENDO;  Surgeon: Luretha Murphy, MD;  Location: [facility];  Service: General;  Laterality: N/A;   TONSILLECTOMY AND ADENOIDECTOMY      Family History  Problem Relation Age of Onset   Hyperlipidemia Father    Diabetes Maternal Grandmother    Breast cancer Maternal Grandmother    Heart disease Paternal Grandmother  Hypertension Paternal Grandmother    Stroke Other    Colon cancer Neg Hx     Social History   Socioeconomic History   Marital status: Married    Spouse name: Not on file   Number of children: 2   Years of education: Not on file   Highest education level: Not on file  Occupational History   Occupation: Personnel officer fro [employer]  Tobacco Use   Smoking status: Never   Smokeless tobacco: Never  Vaping Use   Vaping status: Never Used  Substance and Sexual Activity   Alcohol use: No    Alcohol/week: 0.0 standard drinks of alcohol   Drug use: No   Sexual activity: Yes    Partners: Male    Birth  control/protection: None  Other Topics Concern   Not on file  Social History Narrative   G4P2  (2 miscarriages)    Household-- pt and 2 children  (2006, 2012)   Social Determinants of Corporate investment banker Strain: Not on file  Food Insecurity: Not on file  Transportation Needs: Not on file  Physical Activity: Not on file  Stress: Not on file  Social Connections: Not on file  Intimate Partner Violence: Not on file    Review of Systems  Constitutional:  Positive for malaise/fatigue. Negative for chills and fever.  Musculoskeletal:  Positive for joint pain.  All other systems reviewed and are negative.       Objective    BP (!) 133/93   Pulse 92   Temp 97.7 F (36.5 C) (Oral)   Resp 16   Wt (!) 338 lb (153.3 kg)   SpO2 97%   BMI 57.99 kg/m   Physical Exam Vitals and nursing note reviewed.  Constitutional:      General: She is not in acute distress.    Appearance: She is obese.  HENT:     Head: Normocephalic and atraumatic.  Eyes:     Conjunctiva/sclera: Conjunctivae normal.  Cardiovascular:     Rate and Rhythm: Normal rate and regular rhythm.  Pulmonary:     Effort: Pulmonary effort is normal.     Breath sounds: Normal breath sounds.  Musculoskeletal:        General: Tenderness present. No swelling or deformity.  Neurological:     General: No focal deficit present.     Mental Status: She is alert and oriented to person, place, and time.         Assessment & Plan:   Arthralgia, unspecified joint -     Arthritis Panel -     CBC with Differential/Platelet -     CMP14+EGFR  Iron deficiency anemia, unspecified iron deficiency anemia type -     CBC with Differential/Platelet  Prediabetes  Reactive depression     Return in about 6 weeks (around 03/25/2023) for follow up.   Monica Raymond, MD

## 2023-03-25 ENCOUNTER — Encounter: Payer: Self-pay | Admitting: Family Medicine

## 2023-03-25 ENCOUNTER — Ambulatory Visit (INDEPENDENT_AMBULATORY_CARE_PROVIDER_SITE_OTHER): Payer: BC Managed Care – PPO | Admitting: Family Medicine

## 2023-03-25 VITALS — BP 151/98 | HR 88 | Temp 98.7°F | Resp 16 | Ht 65.0 in | Wt 334.8 lb

## 2023-03-25 DIAGNOSIS — M2550 Pain in unspecified joint: Secondary | ICD-10-CM

## 2023-03-25 DIAGNOSIS — Z7689 Persons encountering health services in other specified circumstances: Secondary | ICD-10-CM

## 2023-03-25 DIAGNOSIS — I10 Essential (primary) hypertension: Secondary | ICD-10-CM | POA: Diagnosis not present

## 2023-03-25 DIAGNOSIS — Z6841 Body Mass Index (BMI) 40.0 and over, adult: Secondary | ICD-10-CM

## 2023-03-25 DIAGNOSIS — E66813 Obesity, class 3: Secondary | ICD-10-CM | POA: Diagnosis not present

## 2023-03-25 MED ORDER — DICLOFENAC SODIUM 1 % EX GEL: 4.0000 g | Freq: Four times a day (QID) | CUTANEOUS | 3 refills | Status: DC

## 2023-03-25 MED ORDER — TIRZEPATIDE 5 MG/0.5ML ~~LOC~~ SOAJ: 5.0000 mg | SUBCUTANEOUS | 0 refills | Status: DC

## 2023-03-27 ENCOUNTER — Other Ambulatory Visit: Payer: Self-pay

## 2023-03-27 ENCOUNTER — Encounter: Payer: Self-pay | Admitting: Family Medicine

## 2023-03-27 NOTE — Progress Notes (Signed)
Established Patient Office Visit  Subjective    Patient ID: Monica Cantu, female    DOB: [birth year]  Age: 38 y.o. MRN: [id number]  CC:  Chief Complaint  Patient presents with   Follow-up    6 week    HPI Monica Cantu presents for routine follow up of hypertension. She also reports that she would like to start mounjaro. She also reports some joint pain of varied joints with the weather change.   Outpatient Encounter Medications as of 03/25/2023  Medication Sig   albuterol (VENTOLIN HFA) 108 (90 Base) MCG/ACT inhaler Inhale 1-2 puffs into the lungs every 4 (four) hours as needed for wheezing or shortness of breath.   benzonatate (TESSALON) 100 MG capsule Take 1 capsule (100 mg total) by mouth 2 (two) times daily as needed for cough.   diazepam (VALIUM) 10 MG tablet Take as needed 30-60 minutes before event (airplane travel)   diclofenac Sodium (VOLTAREN) 1 % GEL Apply 4 g topically 4 (four) times daily.   EPINEPHrine 0.3 mg/0.3 mL IJ SOAJ injection See admin instructions.   FLOVENT HFA 110 MCG/ACT inhaler    fluticasone (FLONASE) 50 MCG/ACT nasal spray 1 spray in each nostril   ibuprofen (ADVIL) 600 MG tablet Take 1 tablet (600 mg total) by mouth every 6 (six) hours as needed.   levocetirizine (XYZAL) 5 MG tablet SMARTSIG:1 Tablet(s) By Mouth Every Evening   montelukast (SINGULAIR) 10 MG tablet Take 10 mg by mouth daily.   nitrofurantoin, macrocrystal-monohydrate, (MACROBID) 100 MG capsule Take 1 capsule (100 mg total) by mouth 2 (two) times daily.   nystatin (MYCOSTATIN) 100000 UNIT/ML suspension Take 5 mLs (500,000 Units total) by mouth 4 (four) times daily.   ondansetron (ZOFRAN ODT) 4 MG disintegrating tablet Take 1 tablet (4 mg total) by mouth every 8 (eight) hours as needed for nausea or vomiting.   ondansetron (ZOFRAN) 4 MG tablet Take 1 tablet (4 mg total) by mouth every 8 (eight) hours as needed for nausea or vomiting.   pantoprazole (PROTONIX) 40 MG tablet Take 1  tablet (40 mg total) by mouth 2 (two) times daily.   phentermine (ADIPEX-P) 37.5 MG tablet Take 1 tablet (37.5 mg total) by mouth daily before breakfast.   phentermine 37.5 MG capsule Take by mouth.   sertraline (ZOLOFT) 50 MG tablet Take 1 tablet (50 mg total) by mouth daily.   tirzepatide [facility]) 5 MG/0.5ML Pen Inject 5 mg into the skin once a week.   tranexamic acid (LYSTEDA) 650 MG TABS tablet Take 2 tablets 3 times a day by oral route.   Vitamin D, Ergocalciferol, (DRISDOL) 1.25 MG (50000 UNIT) CAPS capsule Take 50,000 Units by mouth once a week.   No facility-administered encounter medications on file as of 03/25/2023.    Past Medical History:  Diagnosis Date   Allergy    Asthma    Chest pain    Hypertension    Multiple thyroid nodules    per patient    PONV (postoperative nausea and vomiting)    with D+ C and tonsillectomy    Pre-diabetes     Past Surgical History:  Procedure Laterality Date   DILATION AND CURETTAGE OF UTERUS     LAPAROSCOPIC GASTRIC SLEEVE RESECTION N/A 03/02/2017   Procedure: LAPAROSCOPIC GASTRIC SLEEVE RESECTION, UPPER ENDO;  Surgeon: Luretha Murphy, MD;  Location: [facility];  Service: General;  Laterality: N/A;   TONSILLECTOMY AND ADENOIDECTOMY      Family History  Problem Relation Age of  Onset   Hyperlipidemia Father    Diabetes Maternal Grandmother    Breast cancer Maternal Grandmother    Heart disease Paternal Grandmother    Hypertension Paternal Grandmother    Stroke Other    Colon cancer Neg Hx     Social History   Socioeconomic History   Marital status: Married    Spouse name: Not on file   Number of children: 2   Years of education: Not on file   Highest education level: Not on file  Occupational History   Occupation: Personnel officer fro [employer]  Tobacco Use   Smoking status: Never   Smokeless tobacco: Never  Vaping Use   Vaping status: Never Used  Substance and Sexual Activity   Alcohol use: No    Alcohol/week:  0.0 standard drinks of alcohol   Drug use: No   Sexual activity: Yes    Partners: Male    Birth control/protection: None  Other Topics Concern   Not on file  Social History Narrative   G4P2  (2 miscarriages)    Household-- pt and 2 children  (2006, 2012)   Social Drivers of Corporate investment banker Strain: Low Risk  (03/25/2023)   Overall Financial Resource Strain (CARDIA)    Difficulty of Paying Living Expenses: Not hard at all  Food Insecurity: No Food Insecurity (03/25/2023)   Hunger Vital Sign    Worried About Running Out of Food in the Last Year: Never true    Ran Out of Food in the Last Year: Never true  Transportation Needs: No Transportation Needs (03/25/2023)   PRAPARE - Administrator, Civil Service (Medical): No    Lack of Transportation (Non-Medical): No  Physical Activity: Inactive (03/25/2023)   Exercise Vital Sign    Days of Exercise per Week: 0 days    Minutes of Exercise per Session: 0 min  Stress: No Stress Concern Present (03/25/2023)   [employer] of Occupational Health - Occupational Stress Questionnaire    Feeling of Stress : Only a little  Social Connections: Moderately Integrated (03/25/2023)   Social Connection and Isolation Panel [NHANES]    Frequency of Communication with Friends and Family: More than three times a week    Frequency of Social Gatherings with Friends and Family: Once a week    Attends Religious Services: More than 4 times per year    Active Member of [employer] or Organizations: No    Attends Banker Meetings: Never    Marital Status: Married  Catering manager Violence: Not At Risk (03/25/2023)   Humiliation, Afraid, Rape, and Kick questionnaire    Fear of Current or Ex-Partner: No    Emotionally Abused: No    Physically Abused: No    Sexually Abused: No    Review of Systems  All other systems reviewed and are negative.       Objective    BP (!) 151/98 (BP Location: Right Arm, Patient  Position: Sitting, Cuff Size: Normal)   Pulse 88   Temp 98.7 F (37.1 C) (Oral)   Resp 16   Ht 5\' 5"  (1.651 m)   Wt (!) 334 lb 12.8 oz (151.9 kg)   SpO2 98%   BMI 55.71 kg/m   Physical Exam Vitals and nursing note reviewed.  Constitutional:      General: She is not in acute distress.    Appearance: She is obese.  HENT:     Head: Normocephalic and atraumatic.  Eyes:  Conjunctiva/sclera: Conjunctivae normal.  Cardiovascular:     Rate and Rhythm: Normal rate and regular rhythm.  Pulmonary:     Effort: Pulmonary effort is normal.     Breath sounds: Normal breath sounds.  Musculoskeletal:        General: Tenderness present. No swelling or deformity.  Neurological:     General: No focal deficit present.     Mental Status: She is alert and oriented to person, place, and time.         Assessment & Plan:   1. Essential hypertension (Primary) Elevated readings. Discussed compliance. Continue   2. Encounter for weight management Monjauro prescribed.   3. Class 3 severe obesity due to excess calories with serious comorbidity and body mass index (BMI) of 50.0 to 59.9 in adult (HCC)   4. Arthralgia, unspecified joint Voltaren gel prescribed. Patient deferred steroids at this time.     Return in about 3 months (around 06/23/2023) for follow up.   [name], MD

## 2023-04-15 ENCOUNTER — Other Ambulatory Visit: Payer: Self-pay | Admitting: Family Medicine

## 2023-04-15 MED ORDER — CLONAZEPAM 0.5 MG PO TABS: 0.5000 mg | ORAL_TABLET | Freq: Two times a day (BID) | ORAL | 1 refills | Status: DC | PRN

## 2023-04-30 DIAGNOSIS — F4322 Adjustment disorder with anxiety: Secondary | ICD-10-CM | POA: Diagnosis not present

## 2023-05-12 ENCOUNTER — Ambulatory Visit: Payer: BC Managed Care – PPO | Admitting: Family Medicine

## 2023-05-14 DIAGNOSIS — F4322 Adjustment disorder with anxiety: Secondary | ICD-10-CM | POA: Diagnosis not present

## 2023-05-18 ENCOUNTER — Telehealth: Payer: Self-pay | Admitting: Family Medicine

## 2023-05-18 NOTE — Telephone Encounter (Signed)
 A document form has been faxed: FMLA, to be filled out by provider. Send document back via Fax within 5-days. Document is located in providers tray at front office.           Fax number:  [phone]

## 2023-05-18 NOTE — Telephone Encounter (Signed)
 Paper at [facility], [employer]

## 2023-05-19 ENCOUNTER — Telehealth (INDEPENDENT_AMBULATORY_CARE_PROVIDER_SITE_OTHER): Payer: BC Managed Care – PPO | Admitting: Family Medicine

## 2023-05-19 DIAGNOSIS — Z0289 Encounter for other administrative examinations: Secondary | ICD-10-CM

## 2023-05-19 DIAGNOSIS — K529 Noninfective gastroenteritis and colitis, unspecified: Secondary | ICD-10-CM

## 2023-05-19 DIAGNOSIS — F4321 Adjustment disorder with depressed mood: Secondary | ICD-10-CM

## 2023-05-19 MED ORDER — ONDANSETRON HCL 4 MG PO TABS: 4.0000 mg | ORAL_TABLET | Freq: Three times a day (TID) | ORAL | 0 refills | Status: AC | PRN

## 2023-05-19 NOTE — Progress Notes (Unsigned)
Virtual Visit via Video Note  I connected with Monica Cantu on 05/19/23 at  2:00 PM EST by a video enabled telemedicine application and verified that I am speaking with the correct person using two identifiers.  Location: Patient: [place] - home Provider: [place] - office   I discussed the limitations of evaluation and management by telemedicine and the availability of in person appointments. The patient expressed understanding and agreed to proceed.  History of Present Illness: Patient needs completion of FMLA form. 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 She also is having GI sx as many at her work are having with diarrhea and nausea. She reports intermittent fevers but no chills.    Observations/Objective:   Assessment and Plan: 1. Situational depression (Primary) Continue present management  2. Encounter for completion of form with patient FMLA form completed  3. Noninfectious gastroenteritis, unspecified type Zofran prescribed   Follow Up Instructions: PRN   I discussed the assessment and treatment plan with the patient. The patient was provided an opportunity to ask questions and all were answered. The patient agreed with the plan and demonstrated an understanding of the instructions.   The patient was advised to call back or seek an in-person evaluation if the symptoms worsen or if the condition fails to improve as anticipated.  I provided 29 minutes of non-face-to-face time during this encounter.   [name], MD

## 2023-05-20 ENCOUNTER — Ambulatory Visit (INDEPENDENT_AMBULATORY_CARE_PROVIDER_SITE_OTHER): Payer: BC Managed Care – PPO | Admitting: Family Medicine

## 2023-05-20 ENCOUNTER — Encounter: Payer: Self-pay | Admitting: Family Medicine

## 2023-05-20 VITALS — BP 134/84 | Temp 98.7°F | Resp 16 | Ht 65.0 in | Wt 320.8 lb

## 2023-05-20 DIAGNOSIS — K529 Noninfective gastroenteritis and colitis, unspecified: Secondary | ICD-10-CM | POA: Diagnosis not present

## 2023-05-21 ENCOUNTER — Encounter: Payer: Self-pay | Admitting: Family Medicine

## 2023-05-22 ENCOUNTER — Encounter: Payer: Self-pay | Admitting: Family Medicine

## 2023-05-22 NOTE — Progress Notes (Signed)
Established Patient Office Visit  Subjective    Patient ID: Monica Cantu, female    DOB: 11-09-1985  Age: 38 y.o. MRN: [id number]  CC:  Chief Complaint  Patient presents with   Nausea    Vomiting, diarrhea     HPI Monica Cantu presents for complaint of persistent gastroenteritis. She has been having diarrhea and nausea. She has utilized zofran with some results. Reports some possible fever but deneis chills. 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   Outpatient Encounter Medications as of 05/20/2023  Medication Sig   albuterol (VENTOLIN HFA) 108 (90 Base) MCG/ACT inhaler Inhale 1-2 puffs into the lungs every 4 (four) hours as needed for wheezing or shortness of breath.   benzonatate (TESSALON) 100 MG capsule Take 1 capsule (100 mg total) by mouth 2 (two) times daily as needed for cough.   clonazePAM (KLONOPIN) 0.5 MG tablet Take 1 tablet (0.5 mg total) by mouth 2 (two) times daily as needed for anxiety.   diazepam (VALIUM) 10 MG tablet Take as needed 30-60 minutes before event (airplane travel)   diclofenac Sodium (VOLTAREN) 1 % GEL Apply 4 g topically 4 (four) times daily.   EPINEPHrine 0.3 mg/0.3 mL IJ SOAJ injection See admin instructions.   FLOVENT HFA 110 MCG/ACT inhaler    fluticasone (FLONASE) 50 MCG/ACT nasal spray 1 spray in each nostril   ibuprofen (ADVIL) 600 MG tablet Take 1 tablet (600 mg total) by mouth every 6 (six) hours as needed.   levocetirizine (XYZAL) 5 MG tablet SMARTSIG:1 Tablet(s) By Mouth Every Evening   montelukast (SINGULAIR) 10 MG tablet Take 10 mg by mouth daily.   nitrofurantoin, macrocrystal-monohydrate, (MACROBID) 100 MG capsule Take 1 capsule (100 mg total) by mouth 2 (two) times daily.   nystatin (MYCOSTATIN) 100000 UNIT/ML suspension Take 5 mLs (500,000 Units total) by mouth 4 (four) times daily.   ondansetron (ZOFRAN ODT) 4 MG disintegrating tablet Take 1 tablet (4 mg total) by mouth every 8 (eight) hours as needed for nausea or  vomiting.   ondansetron (ZOFRAN) 4 MG tablet Take 1 tablet (4 mg total) by mouth every 8 (eight) hours as needed for nausea or vomiting.   pantoprazole (PROTONIX) 40 MG tablet Take 1 tablet (40 mg total) by mouth 2 (two) times daily.   phentermine (ADIPEX-P) 37.5 MG tablet Take 1 tablet (37.5 mg total) by mouth daily before breakfast.   sertraline (ZOLOFT) 50 MG tablet Take 1 tablet (50 mg total) by mouth daily.   tranexamic acid (LYSTEDA) 650 MG TABS tablet Take 2 tablets 3 times a day by oral route.   Vitamin D, Ergocalciferol, (DRISDOL) 1.25 MG (50000 UNIT) CAPS capsule Take 50,000 Units by mouth once a week.   phentermine 37.5 MG capsule Take by mouth.   tirzepatide (MOUNJARO) 5 MG/0.5ML Pen Inject 5 mg into the skin once a week.   No facility-administered encounter medications on file as of 05/20/2023.    Past Medical History:  Diagnosis Date   Allergy    Asthma    Chest pain    Hypertension    Multiple thyroid nodules    per patient    PONV (postoperative nausea and vomiting)    with D+ C and tonsillectomy    Pre-diabetes     Past Surgical History:  Procedure Laterality Date   DILATION AND CURETTAGE OF UTERUS     LAPAROSCOPIC GASTRIC SLEEVE RESECTION N/A 03/02/2017   Procedure: LAPAROSCOPIC GASTRIC SLEEVE RESECTION, UPPER ENDO;  Surgeon:  Luretha Murphy, MD;  Location: [facility];  Service: General;  Laterality: N/A;   TONSILLECTOMY AND ADENOIDECTOMY      Family History  Problem Relation Age of Onset   Hyperlipidemia Father    Diabetes Maternal Grandmother    Breast cancer Maternal Grandmother    Heart disease Paternal Grandmother    Hypertension Paternal Grandmother    Stroke Other    Colon cancer Neg Hx     Social History   Socioeconomic History   Marital status: Married    Spouse name: Not on file   Number of children: 2   Years of education: Not on file   Highest education level: Associate degree: occupational, Scientist, product/process development, or vocational program  Occupational  History   Occupation: Personnel officer fro [employer]  Tobacco Use   Smoking status: Never   Smokeless tobacco: Never  Vaping Use   Vaping status: Never Used  Substance and Sexual Activity   Alcohol use: No    Alcohol/week: 0.0 standard drinks of alcohol   Drug use: No   Sexual activity: Yes    Partners: Male    Birth control/protection: None  Other Topics Concern   Not on file  Social History Narrative   G4P2  (2 miscarriages)    Household-- pt and 2 children  (2006, 2012)   Social Drivers of Corporate investment banker Strain: Low Risk  (05/11/2023)   Overall Financial Resource Strain (CARDIA)    Difficulty of Paying Living Expenses: Not very hard  Food Insecurity: No Food Insecurity (05/11/2023)   Hunger Vital Sign    Worried About Running Out of Food in the Last Year: Never true    Ran Out of Food in the Last Year: Never true  Transportation Needs: No Transportation Needs (05/11/2023)   PRAPARE - Administrator, Civil Service (Medical): No    Lack of Transportation (Non-Medical): No  Physical Activity: Inactive (05/11/2023)   Exercise Vital Sign    Days of Exercise per Week: 0 days    Minutes of Exercise per Session: 0 min  Stress: Patient Declined (05/11/2023)   [employer] of Occupational Health - Occupational Stress Questionnaire    Feeling of Stress : Patient declined  Social Connections: Socially Integrated (05/11/2023)   Social Connection and Isolation Panel [NHANES]    Frequency of Communication with Friends and Family: More than three times a week    Frequency of Social Gatherings with Friends and Family: More than three times a week    Attends Religious Services: More than 4 times per year    Active Member of [employer] or Organizations: Yes    Attends Banker Meetings: More than 4 times per year    Marital Status: Married  Catering manager Violence: Not At Risk (03/25/2023)   Humiliation, Afraid, Rape, and Kick questionnaire    Fear of  Current or Ex-Partner: No    Emotionally Abused: No    Physically Abused: No    Sexually Abused: No    Review of Systems  Constitutional:  Positive for fever. Negative for chills.  Gastrointestinal:  Positive for diarrhea and nausea. Negative for vomiting.  All other systems reviewed and are negative.       Objective    BP 134/84 (BP Location: Right Arm, Patient Position: Sitting, Cuff Size: Normal)   Temp 98.7 F (37.1 C) (Oral)   Resp 16   Ht 5\' 5"  (1.651 m)   Wt (!) 320 lb 12.8 oz (145.5 kg)  SpO2 97%   BMI 53.38 kg/m   Physical Exam Vitals and nursing note reviewed.  Constitutional:      General: She is not in acute distress.    Appearance: She is obese.  Cardiovascular:     Rate and Rhythm: Normal rate and regular rhythm.  Pulmonary:     Effort: Pulmonary effort is normal.     Breath sounds: Normal breath sounds.  Abdominal:     Palpations: Abdomen is soft.     Tenderness: There is no abdominal tenderness.  Neurological:     General: No focal deficit present.     Mental Status: She is alert and oriented to person, place, and time.         Assessment & Plan:   Noninfectious gastroenteritis, unspecified type  ?norovirus. Improving (slowly). Continue with zofran and adequate hydration.   Return if symptoms worsen or fail to improve.   [name], MD

## 2023-06-11 ENCOUNTER — Encounter: Payer: Self-pay | Admitting: Family Medicine

## 2023-06-23 ENCOUNTER — Ambulatory Visit: Payer: BC Managed Care – PPO | Admitting: Family Medicine

## 2023-06-24 ENCOUNTER — Ambulatory Visit: Admitting: Family Medicine

## 2023-06-25 NOTE — Progress Notes (Signed)
 Office Visit Note  Patient: Monica Cantu             Date of Birth: 05-06-1985           MRN: [id number]             PCP: Georganna Skeans, MD Referring: Georganna Skeans, MD Visit Date: 07/09/2023 Occupation: @GUAROCC @  Subjective:  Pain in multiple joints and muscles  History of Present Illness: Monica Cantu is a 38 y.o. female seen in consultation per request of her PCP for the evaluation of elevated sedimentation rate and joint pain.  According the patient in 2018 she had gastrectomy and in 2020.  She had significant weight loss during that time.  She states she started having pain in her shoulders, elbows, hands, hips, knees, ankles and feet.  She states her hands and feet cramp.  She also describes discomfort over the trochanteric region.  She notices swelling sometimes over ankles.  She works as a Interior and spatial designer at [employer] and mostly sits at [employer].  She also has a stand-up desk now.  She states she gets very stiff after prolonged sitting.  She also has stiffness in the morning.  She takes a warm shower in the morning which helps.  She has had problems with hives and rashes in the past for which she has seen an allergist and was diagnosed with allergy.  She has not had any recent rashes.  There is no history of oral ulcers, nasal ulcers, malar rash, photosensitivity, Raynaud's or lymphadenopathy.  There is no family history of autoimmune disease.  She is right-handed.  She enjoys reading and also walks for exercise.  She is married and trying to conceive.  She is gravida 4, para 2, miscarriages 2.  There is no history of preeclampsia or DVTs.  She does not drink any alcohol and has been a non-smoker.  She lost her son in January in a car accident was 19.    Activities of Daily Living:  Patient reports morning stiffness for 45-60 minutes.   Patient Reports nocturnal pain.  Difficulty dressing/grooming: Denies Difficulty climbing stairs: Denies Difficulty getting out of chair:  Denies Difficulty using hands for taps, buttons, cutlery, and/or writing: Reports  Review of Systems  Constitutional:  Positive for fatigue.  HENT:  Negative for mouth sores and mouth dryness.   Eyes:  Negative for dryness.  Respiratory:  Positive for shortness of breath.   Cardiovascular:  Negative for chest pain and palpitations.  Gastrointestinal:  Positive for constipation. Negative for blood in stool and diarrhea.  Endocrine: Negative for increased urination.  Genitourinary:  Negative for involuntary urination.  Musculoskeletal:  Positive for joint pain, joint pain, joint swelling, myalgias, muscle weakness, morning stiffness, muscle tenderness and myalgias. Negative for gait problem.  Skin:  Negative for color change, rash, hair loss and sensitivity to sunlight.  Allergic/Immunologic: Positive for susceptible to infections.  Neurological:  Positive for dizziness and headaches.  Hematological:  Negative for swollen glands.  Psychiatric/Behavioral:  Positive for sleep disturbance. Negative for depressed mood. The patient is not nervous/anxious.     PMFS History:  Patient Active Problem List   Diagnosis Date Noted   Allergic rhinitis 11/01/2021   Allergic rhinitis due to pollen 11/01/2021   Chronic allergic conjunctivitis 11/01/2021   Idiopathic urticaria 11/01/2021   Mild persistent asthma, uncomplicated 11/01/2021   Menorrhagia 07/27/2019   S/P laparoscopic sleeve gastrectomyNov 2018 03/02/2017   Low back pain radiating to left leg 09/30/2016  Multinodular goiter 05/27/2016   Annual physical exam 02/22/2016   PCP NOTES >>>>>>>>>>>>>>>>>>>>> 05/31/2015   Asthma 05/11/2015   Morbid obesity (HCC) 04/24/2015    Past Medical History:  Diagnosis Date   Allergy    Asthma    Chest pain    Hypertension    Multiple thyroid nodules    per patient    PONV (postoperative nausea and vomiting)    with D+ C and tonsillectomy    Pre-diabetes     Family History  Problem Relation  Age of Onset   Hypertension Mother    Scoliosis Mother    Hyperlipidemia Father    Diabetes Maternal Grandmother    Breast cancer Maternal Grandmother    Heart disease Paternal Grandmother    Hypertension Paternal Grandmother    Healthy Daughter    Healthy Son    Stroke Other    Colon cancer Neg Hx    Past Surgical History:  Procedure Laterality Date   DILATION AND CURETTAGE OF UTERUS     LAPAROSCOPIC GASTRIC SLEEVE RESECTION N/A 03/02/2017   Procedure: LAPAROSCOPIC GASTRIC SLEEVE RESECTION, UPPER ENDO;  Surgeon: Luretha Murphy, MD;  Location: [facility];  Service: General;  Laterality: N/A;   TONSILLECTOMY AND ADENOIDECTOMY     Social History   Social History Narrative   G4P2  (2 miscarriages)    Household-- pt and 2 children  (2006, 2012)   Immunization History  Administered Date(s) Administered   Influenza, High Dose Seasonal PF 03/27/2020   Influenza,inj,Quad PF,6+ Mos 02/22/2016   Influenza-Unspecified 02/05/2018, 01/18/2019, 03/27/2020   Janssen (J&J) SARS-COV-2 Vaccination 07/14/2019, 03/27/2020   PFIZER(Purple Top)SARS-COV-2 Vaccination 03/27/2020   PPD Test 06/10/2019   Tdap 02/22/2016     Objective: Vital Signs: BP (!) 148/96 (BP Location: Right Wrist, Patient Position: Sitting, Cuff Size: Normal)   Pulse 82   Resp 16   Ht 5\' 5"  (1.651 m)   Wt (!) 324 lb (147 kg)   BMI 53.92 kg/m    Physical Exam Vitals and nursing note reviewed.  Constitutional:      Appearance: She is well-developed.  HENT:     Head: Normocephalic and atraumatic.  Eyes:     Conjunctiva/sclera: Conjunctivae normal.  Cardiovascular:     Rate and Rhythm: Normal rate and regular rhythm.     Heart sounds: Normal heart sounds.  Pulmonary:     Effort: Pulmonary effort is normal.     Breath sounds: Normal breath sounds.  Abdominal:     General: Bowel sounds are normal.     Palpations: Abdomen is soft.  Musculoskeletal:     Cervical back: Normal range of motion.  Lymphadenopathy:      Cervical: No cervical adenopathy.  Skin:    General: Skin is warm and dry.     Capillary Refill: Capillary refill takes less than 2 seconds.  Neurological:     Mental Status: She is alert and oriented to person, place, and time.  Psychiatric:        Behavior: Behavior normal.      Musculoskeletal Exam: Cervical spine was in good range of motion.  She had painful range of motion of her lumbar spine.  Shoulders, elbows, wrist joints, MCPs PIPs and DIPs Juengel range of motion with no synovitis.  Hip joints were in good range of motion.  She had some tenderness over the trochanteric region.  Knee joints were in good range of motion.  There was no swelling or synovitis over ankles or MTPs.  She  had bilateral pes planus.  CDAI Exam: CDAI Score: -- Patient Global: --; Provider Global: -- Swollen: --; Tender: -- Joint Exam 07/09/2023   No joint exam has been documented for this visit   There is currently no information documented on the homunculus. Go to the Rheumatology activity and complete the homunculus joint exam.  Investigation: No additional findings.  Imaging: XR Lumbar Spine 2-3 Views Result Date: 07/09/2023 No significant discussed narrowing was noted.  Facet joint arthropathy was noted.  Possible T11 vertebral wedging was noted.  No SI joint sclerosis or narrowing was noted. Impression: Lumbar facet joint arthropathy was noted.   Recent Labs: Lab Results  Component Value Date   WBC 7.7 02/11/2023   HGB 11.4 02/11/2023   PLT 292 02/11/2023   NA 142 02/11/2023   K 4.1 02/11/2023   CL 107 (H) 02/11/2023   CO2 19 (L) 02/11/2023   GLUCOSE 72 02/11/2023   BUN 11 02/11/2023   CREATININE 0.86 02/11/2023   BILITOT 0.3 02/11/2023   ALKPHOS 73 02/11/2023   AST 15 02/11/2023   ALT 13 02/11/2023   PROT 7.4 02/11/2023   ALBUMIN 4.5 02/11/2023   CALCIUM 9.4 02/11/2023   GFRAA >60 08/21/2019   QFTBGOLDPLUS NEGATIVE 12/11/2017   February 11, 2023 sed rate 79, uric acid 6.7, RF  negative  Speciality Comments: No specialty comments available.  Procedures:  No procedures performed Allergies: Cephalexin and Amoxicillin-pot clavulanate   Assessment / Plan:     Visit Diagnoses: Polyarthralgia -patient complains of pain and discomfort in multiple joints.  She complains of discomfort in her lower back, shoulders, elbows, hips, knees.  She complains of muscle cramps in her hands and her feet.  She may benefit from magnesium malate which was discussed.  No synovitis was noted on the examination.  Plan: Cyclic citrul peptide antibody, IgG, Mutated Citrullinated Vimentin (MCV) Antibody, ANA  Chronic midline lower back pain without sciatica-patient complains of lower back pain .  She had no point tenderness.  Plan: XR Lumbar Spine 2-3 Views.  T11 vertebral wedging was also noted.  No SI joint sclerosis or narrowing was noted.  X-rays showed facet joint arthropathy.  A handout on thoracic and lower back exercises was given.  Pes planus of both feet-she had bilateral pes planus.  Shoes with arch supports were advised.  Myalgia -she complains of muscle aches for many years.  She also had generalized hyperalgesia and positive tender points.  Possibility of myofascial pain syndrome and fibromyalgia was discussed.  Plan: CK.  She may benefit from the use of Cymbalta.  Elevated sed rate sed rate is elevated at 79.  Which could be due to high BMI.  Jodie Echevaria lost her 57 year old son in January 2025.  She has been going through grieving process.  Cymbalta may be helpful with the mood.  I advised her to discuss it with her PCP.  Idiopathic urticaria-patient states she had problems with urticaria in the past.  She has seen an allergist in the past.  She has had no recent episodes.  Seasonal allergic rhinitis due to pollen  Mild persistent asthma, uncomplicated  Multinodular goiter  S/P laparoscopic sleeve gastrectomyNov 2018  Morbid obesity (HCC)  Orders: Orders Placed This  Encounter  Procedures   XR Lumbar Spine 2-3 Views   CK   Cyclic citrul peptide antibody, IgG   Mutated Citrullinated Vimentin (MCV) Antibody   ANA   No orders of the defined types were placed in this encounter.   Face-to-face time  spent with patient was 45 minutes. Greater than 50% of time was spent in counseling and coordination of care.  Follow-Up Instructions: Return for Polyarthralgia.   Pollyann Savoy, MD  Note - This record has been created using Animal nutritionist.  Chart creation errors have been sought, but may not always  have been located. Such creation errors do not reflect on  the standard of medical care.

## 2023-07-09 ENCOUNTER — Ambulatory Visit: Payer: BC Managed Care – PPO | Attending: Rheumatology | Admitting: Rheumatology

## 2023-07-09 ENCOUNTER — Encounter: Payer: Self-pay | Admitting: Rheumatology

## 2023-07-09 ENCOUNTER — Other Ambulatory Visit: Payer: Self-pay

## 2023-07-09 ENCOUNTER — Telehealth (INDEPENDENT_AMBULATORY_CARE_PROVIDER_SITE_OTHER): Admitting: Family Medicine

## 2023-07-09 ENCOUNTER — Ambulatory Visit (INDEPENDENT_AMBULATORY_CARE_PROVIDER_SITE_OTHER)

## 2023-07-09 ENCOUNTER — Telehealth: Payer: Self-pay

## 2023-07-09 VITALS — BP 148/96 | HR 82 | Resp 16 | Ht 65.0 in | Wt 324.0 lb

## 2023-07-09 DIAGNOSIS — M2550 Pain in unspecified joint: Secondary | ICD-10-CM | POA: Diagnosis not present

## 2023-07-09 DIAGNOSIS — Z9884 Bariatric surgery status: Secondary | ICD-10-CM

## 2023-07-09 DIAGNOSIS — K219 Gastro-esophageal reflux disease without esophagitis: Secondary | ICD-10-CM | POA: Diagnosis not present

## 2023-07-09 DIAGNOSIS — E66813 Obesity, class 3: Secondary | ICD-10-CM

## 2023-07-09 DIAGNOSIS — M791 Myalgia, unspecified site: Secondary | ICD-10-CM

## 2023-07-09 DIAGNOSIS — M545 Low back pain, unspecified: Secondary | ICD-10-CM

## 2023-07-09 DIAGNOSIS — J301 Allergic rhinitis due to pollen: Secondary | ICD-10-CM

## 2023-07-09 DIAGNOSIS — M797 Fibromyalgia: Secondary | ICD-10-CM

## 2023-07-09 DIAGNOSIS — F4321 Adjustment disorder with depressed mood: Secondary | ICD-10-CM

## 2023-07-09 DIAGNOSIS — J4540 Moderate persistent asthma, uncomplicated: Secondary | ICD-10-CM | POA: Diagnosis not present

## 2023-07-09 DIAGNOSIS — J4530 Mild persistent asthma, uncomplicated: Secondary | ICD-10-CM

## 2023-07-09 DIAGNOSIS — Z6841 Body Mass Index (BMI) 40.0 and over, adult: Secondary | ICD-10-CM

## 2023-07-09 DIAGNOSIS — M79605 Pain in left leg: Secondary | ICD-10-CM

## 2023-07-09 DIAGNOSIS — F329 Major depressive disorder, single episode, unspecified: Secondary | ICD-10-CM | POA: Diagnosis not present

## 2023-07-09 DIAGNOSIS — R700 Elevated erythrocyte sedimentation rate: Secondary | ICD-10-CM

## 2023-07-09 DIAGNOSIS — M2141 Flat foot [pes planus] (acquired), right foot: Secondary | ICD-10-CM

## 2023-07-09 DIAGNOSIS — G8929 Other chronic pain: Secondary | ICD-10-CM

## 2023-07-09 DIAGNOSIS — M2142 Flat foot [pes planus] (acquired), left foot: Secondary | ICD-10-CM

## 2023-07-09 DIAGNOSIS — E042 Nontoxic multinodular goiter: Secondary | ICD-10-CM

## 2023-07-09 DIAGNOSIS — L501 Idiopathic urticaria: Secondary | ICD-10-CM

## 2023-07-09 MED ORDER — CETIRIZINE HCL 10 MG PO TABS: 10.0000 mg | ORAL_TABLET | Freq: Every day | ORAL | 1 refills | Status: AC

## 2023-07-09 MED ORDER — LEVOCETIRIZINE DIHYDROCHLORIDE 5 MG PO TABS: 5.0000 mg | ORAL_TABLET | Freq: Every evening | ORAL | 1 refills | Status: DC

## 2023-07-09 MED ORDER — PANTOPRAZOLE SODIUM 40 MG PO TBEC: 40.0000 mg | DELAYED_RELEASE_TABLET | Freq: Two times a day (BID) | ORAL | 1 refills | Status: DC

## 2023-07-09 MED ORDER — FLOVENT HFA 110 MCG/ACT IN AERO: 1.0000 | INHALATION_SPRAY | RESPIRATORY_TRACT | 2 refills | Status: AC | PRN

## 2023-07-09 MED ORDER — FLUTICASONE PROPIONATE 50 MCG/ACT NA SUSP: 2.0000 | Freq: Every day | NASAL | 2 refills | Status: DC

## 2023-07-09 MED ORDER — ALBUTEROL SULFATE HFA 108 (90 BASE) MCG/ACT IN AERS: 1.0000 | INHALATION_SPRAY | RESPIRATORY_TRACT | 5 refills | Status: AC | PRN

## 2023-07-09 MED ORDER — DULOXETINE HCL 20 MG PO CPEP: 20.0000 mg | ORAL_CAPSULE | Freq: Every day | ORAL | 1 refills | Status: AC

## 2023-07-09 NOTE — Progress Notes (Signed)
 Virtual Visit via Video Note  I connected with Monica Cantu on 07/09/23 at 11:00 AM EDT by a video enabled telemedicine application and verified that I am speaking with the correct person using two identifiers.  Location: Patient: Monica Cantu Provider: [place]   I discussed the limitations of evaluation and management by telemedicine and the availability of in person appointments. The patient expressed understanding and agreed to proceed.  History of Present Illness: Patient with multiple complaints. Recommended by consultant to start cymbalta for joint pain as well as grief reaction. Patient also needs refill of meds for seasonal allergies and asthma and GERD.    Observations/Objective:   Assessment and Plan: 1. Fibromyalgia (Primary) Cymbalta prescribed  2. Reactive depression As above  3. Moderate persistent asthma without complication Meds refilled  4. Gastroesophageal reflux disease, unspecified whether esophagitis present Meds refilled  5. Class 3 severe obesity due to excess calories with serious comorbidity and body mass index (BMI) of 50.0 to 59.9 in adult [facility])    Follow Up Instructions:    I discussed the assessment and treatment plan with the patient. The patient was provided an opportunity to ask questions and all were answered. The patient agreed with the plan and demonstrated an understanding of the instructions.   The patient was advised to call back or seek an in-person evaluation if the symptoms worsen or if the condition fails to improve as anticipated.  I provided 25 minutes of non-face-to-face time during this encounter.   [name], MD

## 2023-07-09 NOTE — Patient Instructions (Signed)
 Thoracic Strain Rehab Ask your health care provider which exercises are safe for you. Do exercises exactly as told by your provider and adjust them as directed. It is normal to feel mild stretching, pulling, tightness, or discomfort as you do these exercises. Stop right away if you feel sudden pain or your pain gets worse. Do not begin these exercises until told by your provider. Stretching and range-of-motion exercise This exercise warms up your muscles and joints and improves the movement and flexibility of your back and shoulders. This exercise also helps to relieve pain. Chest and spine stretch  Lie down on your back on a firm surface. Roll a towel or a small blanket so it is about 4 inches (10 cm) in diameter. Put the towel under the middle of your back so it is under your spine, but not under your shoulder blades. Put your hands behind your head and let your elbows fall to your sides. This will increase your stretch. Take a deep breath (inhale). Hold for __________ seconds. Relax after you breathe out (exhale). Repeat __________ times. Complete this exercise __________ times a day. Strengthening exercises These exercises build strength and endurance in your back and your shoulder blade muscles. Endurance is the ability to use your muscles for a long time, even after they get tired. Alternating arm and leg raises  Get on your hands and knees on a firm surface. If you are on a hard floor, you may want to use padding, such as an exercise mat, to cushion your knees. Line up your arms and legs. Your hands should be directly below your shoulders, and your knees should be directly below your hips. Lift your left leg behind you. At the same time, raise your right arm and straighten it in front of you. Do not lift your leg higher than your hip. Do not lift your arm higher than your shoulder. Keep your abdominal and back muscles tight. Keep your hips facing the ground. Do not arch your  back. Carefully stay balanced. Do not hold your breath. Hold for __________ seconds. Slowly return to the starting position and repeat with your right leg and your left arm. Repeat __________ times. Complete this exercise __________ times a day. Straight arm rows This exercise is also called the shoulder extension exercise. Stand with your feet shoulder width apart. Secure an exercise band to a stable object in front of you so the band is at or above shoulder height. Hold one end of the exercise band in each hand. Straighten your elbows and lift your hands up to shoulder height. Step back, away from the secured end of the exercise band, until the band stretches. Squeeze your shoulder blades together and pull your hands down to the sides of your thighs. Stop when your hands are straight down by your sides. This is shoulder extension. Do not let your hands go behind your body. Hold for __________ seconds. Slowly return to the starting position. Repeat __________ times. Complete this exercise __________ times a day. Rowing scapular retraction This is an exercise in which the shoulder blades (scapulae) are pulled toward each other (retraction). Sit in a stable chair without armrests, or stand up. Secure an exercise band to a stable object in front of you so the band is at shoulder height. Hold one end of the exercise band in each hand. Your palms should face toward each other. Bring your arms out straight in front of you. Step back, away from the secured end of the  exercise band, until the band stretches. Pull the band backward. As you do this, bend your elbows and squeeze your shoulder blades together, but avoid letting the rest of your body move. Do not shrug your shoulders upward while you do this. Stop when your elbows are at your sides or slightly behind your body. Hold for __________ seconds. Slowly straighten your arms to return to the starting position. Repeat __________ times.  Complete this exercise __________ times a day. Posture and body mechanics Good posture and healthy body mechanics can help to relieve stress in your body's tissues and joints. Body mechanics refers to the movements and positions of your body while you do your daily activities. Posture is part of body mechanics. Good posture means: Your spine is in its natural S-curve position (neutral). Your shoulders are pulled back slightly. Your head is not tipped forward. Follow these guidelines to improve your posture and body mechanics in your everyday activities. Standing  When standing, keep your spine neutral and your feet about hip width apart. Keep a slight bend in your knees. Your ears, shoulders, and hips should line up with each other. When you do a task in which you lean forward while standing in one place for a long time, place one foot up on a stable object that is 2-4 inches (5-10 cm) high, such as a footstool. This helps keep your spine neutral. Sitting  When sitting, keep your spine neutral and keep your feet flat on the floor. Use a footrest if needed. Keep your thighs parallel to the floor. Avoid rounding your shoulders, and avoid tilting your head forward. When working at a desk or a computer, keep your desk at a height where your hands are slightly lower than your elbows. Slide your chair under your desk so you are close enough to maintain good posture. When working at a computer, place your monitor at a height where you are looking straight ahead and you do not have to tilt your head forward or downward to look at the screen. Resting When lying down and resting, avoid positions that are most painful for you. If you have pain with activities such as sitting, bending, stooping, or squatting (flexion-basedactivities), lie in a position in which your body does not bend very much. For example, avoid curling up on your side with your arms and knees near your chest (fetal position). If you have  pain with activities such as standing for a long time or reaching with your arms (extension-basedactivities), lie with your spine in a neutral position and bend your knees slightly. Try the following positions: Lie on your side with a pillow between your knees. Lie on your back with a pillow under your knees.  Lifting  When lifting objects, keep your feet at least shoulder width apart and tighten your abdominal muscles. Bend your knees and hips and keep your spine neutral. It is important to lift using the strength of your legs, not your back. Do not lock your knees straight out. Always ask for help to lift heavy or awkward objects. This information is not intended to replace advice given to you by your health care provider. Make sure you discuss any questions you have with your health care provider. Document Revised: 09/05/2022 Document Reviewed: 11/11/2021 Elsevier Patient Education  2024 [employer]  Low Back Sprain or Strain Rehab Ask your health care provider which exercises are safe for you. Do exercises exactly as told by your health care provider and adjust them as directed.  It is normal to feel mild stretching, pulling, tightness, or discomfort as you do these exercises. Stop right away if you feel sudden pain or your pain gets worse. Do not begin these exercises until told by your health care provider. Stretching and range-of-motion exercises These exercises warm up your muscles and joints and improve the movement and flexibility of your back. These exercises also help to relieve pain, numbness, and tingling. Lumbar rotation  Lie on your back on a firm bed or the floor with your knees bent. Straighten your arms out to your sides so each arm forms a 90-degree angle (right angle) with a side of your body. Slowly move (rotate) both of your knees to one side of your body until you feel a stretch in your lower back (lumbar). Try not to let your shoulders lift off the floor. Hold this  position for __________ seconds. Tense your abdominal muscles and slowly move your knees back to the starting position. Repeat this exercise on the other side of your body. Repeat __________ times. Complete this exercise __________ times a day. Single knee to chest  Lie on your back on a firm bed or the floor with both legs straight. Bend one of your knees. Use your hands to move your knee up toward your chest until you feel a gentle stretch in your lower back and buttock. Hold your leg in this position by holding on to the front of your knee. Keep your other leg as straight as possible. Hold this position for __________ seconds. Slowly return to the starting position. Repeat with your other leg. Repeat __________ times. Complete this exercise __________ times a day. Prone extension on elbows  Lie on your abdomen on a firm bed or the floor (prone position). Prop yourself up on your elbows. Use your arms to help lift your chest up until you feel a gentle stretch in your abdomen and your lower back. This will place some of your body weight on your elbows. If this is uncomfortable, try stacking pillows under your chest. Your hips should stay down, against the surface that you are lying on. Keep your hip and back muscles relaxed. Hold this position for __________ seconds. Slowly relax your upper body and return to the starting position. Repeat __________ times. Complete this exercise __________ times a day. Strengthening exercises These exercises build strength and endurance in your back. Endurance is the ability to use your muscles for a long time, even after they get tired. Pelvic tilt This exercise strengthens the muscles that lie deep in the abdomen. Lie on your back on a firm bed or the floor with your legs extended. Bend your knees so they are pointing toward the ceiling and your feet are flat on the floor. Tighten your lower abdominal muscles to press your lower back against the  floor. This motion will tilt your pelvis so your tailbone points up toward the ceiling instead of pointing to your feet or the floor. To help with this exercise, you may place a small towel under your lower back and try to push your back into the towel. Hold this position for __________ seconds. Let your muscles relax completely before you repeat this exercise. Repeat __________ times. Complete this exercise __________ times a day. Alternating arm and leg raises  Get on your hands and knees on a firm surface. If you are on a hard floor, you may want to use padding, such as an exercise mat, to cushion your knees. Line up your  arms and legs. Your hands should be directly below your shoulders, and your knees should be directly below your hips. Lift your left leg behind you. At the same time, raise your right arm and straighten it in front of you. Do not lift your leg higher than your hip. Do not lift your arm higher than your shoulder. Keep your abdominal and back muscles tight. Keep your hips facing the ground. Do not arch your back. Keep your balance carefully, and do not hold your breath. Hold this position for __________ seconds. Slowly return to the starting position. Repeat with your right leg and your left arm. Repeat __________ times. Complete this exercise __________ times a day. Abdominal set with straight leg raise  Lie on your back on a firm bed or the floor. Bend one of your knees and keep your other leg straight. Tense your abdominal muscles and lift your straight leg up, 4-6 inches (10-15 cm) off the ground. Keep your abdominal muscles tight and hold this position for __________ seconds. Do not hold your breath. Do not arch your back. Keep it flat against the ground. Keep your abdominal muscles tense as you slowly lower your leg back to the starting position. Repeat with your other leg. Repeat __________ times. Complete this exercise __________ times a day. Single leg lower  with bent knees Lie on your back on a firm bed or the floor. Tense your abdominal muscles and lift your feet off the floor, one foot at a time, so your knees and hips are bent in 90-degree angles (right angles). Your knees should be over your hips and your lower legs should be parallel to the floor. Keeping your abdominal muscles tense and your knee bent, slowly lower one of your legs so your toe touches the ground. Lift your leg back up to return to the starting position. Do not hold your breath. Do not let your back arch. Keep your back flat against the ground. Repeat with your other leg. Repeat __________ times. Complete this exercise __________ times a day. Posture and body mechanics Good posture and healthy body mechanics can help to relieve stress in your body's tissues and joints. Body mechanics refers to the movements and positions of your body while you do your daily activities. Posture is part of body mechanics. Good posture means: Your spine is in its natural S-curve position (neutral). Your shoulders are pulled back slightly. Your head is not tipped forward (neutral). Follow these guidelines to improve your posture and body mechanics in your everyday activities. Standing  When standing, keep your spine neutral and your feet about hip-width apart. Keep a slight bend in your knees. Your ears, shoulders, and hips should line up. When you do a task in which you stand in one place for a long time, place one foot up on a stable object that is 2-4 inches (5-10 cm) high, such as a footstool. This helps keep your spine neutral. Sitting  When sitting, keep your spine neutral and keep your feet flat on the floor. Use a footrest, if necessary, and keep your thighs parallel to the floor. Avoid rounding your shoulders, and avoid tilting your head forward. When working at a desk or a computer, keep your desk at a height where your hands are slightly lower than your elbows. Slide your chair under  your desk so you are close enough to maintain good posture. When working at a computer, place your monitor at a height where you are looking straight ahead and  you do not have to tilt your head forward or downward to look at the screen. Resting When lying down and resting, avoid positions that are most painful for you. If you have pain with activities such as sitting, bending, stooping, or squatting, lie in a position in which your body does not bend very much. For example, avoid curling up on your side with your arms and knees near your chest (fetal position). If you have pain with activities such as standing for a long time or reaching with your arms, lie with your spine in a neutral position and bend your knees slightly. Try the following positions: Lying on your side with a pillow between your knees. Lying on your back with a pillow under your knees. Lifting  When lifting objects, keep your feet at least shoulder-width apart and tighten your abdominal muscles. Bend your knees and hips and keep your spine neutral. It is important to lift using the strength of your legs, not your back. Do not lock your knees straight out. Always ask for help to lift heavy or awkward objects. This information is not intended to replace advice given to you by your health care provider. Make sure you discuss any questions you have with your health care provider. Document Revised: 07/28/2022 Document Reviewed: 06/11/2020 Elsevier Patient Education  2024 [employer].

## 2023-07-09 NOTE — Telephone Encounter (Signed)
 Good afternoon Monica Cantu can you give me a hand with this prior auth please?   Mounjaro 5MG /0.5ML Auto-injectors     Thank you.

## 2023-07-12 NOTE — Progress Notes (Signed)
 CK normal, anti-CCP normal, ANA negative, MCV pending.

## 2023-07-13 LAB — MUTATED CITRULLINATED VIMENTIN (MCV) ANTIBODY: MUTATED CITRULLINATED VIMENTIN (MCV) AB: <20 U/mL (ref <20)

## 2023-07-13 LAB — CK: Total CK: 147 U/L (ref 20–239)

## 2023-07-13 LAB — ANA: Anti Nuclear Antibody (ANA): NEGATIVE

## 2023-07-13 LAB — CYCLIC CITRUL PEPTIDE ANTIBODY, IGG: Cyclic Citrullin Peptide Ab: <16 U

## 2023-07-14 NOTE — Progress Notes (Signed)
 ANA negative, MCV negative.

## 2023-07-15 ENCOUNTER — Encounter: Payer: Self-pay | Admitting: Family Medicine

## 2023-07-21 ENCOUNTER — Other Ambulatory Visit: Payer: Self-pay | Admitting: Family Medicine

## 2023-07-21 ENCOUNTER — Encounter: Payer: Self-pay | Admitting: Family Medicine

## 2023-07-23 ENCOUNTER — Ambulatory Visit (INDEPENDENT_AMBULATORY_CARE_PROVIDER_SITE_OTHER): Admitting: Family Medicine

## 2023-07-23 ENCOUNTER — Other Ambulatory Visit: Payer: Self-pay

## 2023-07-23 ENCOUNTER — Encounter: Payer: Self-pay | Admitting: Family Medicine

## 2023-07-23 VITALS — BP 103/72 | HR 94 | Temp 99.4°F | Resp 16 | Ht 65.0 in | Wt 318.0 lb

## 2023-07-23 DIAGNOSIS — M79671 Pain in right foot: Secondary | ICD-10-CM | POA: Diagnosis not present

## 2023-07-23 DIAGNOSIS — M79672 Pain in left foot: Secondary | ICD-10-CM | POA: Diagnosis not present

## 2023-07-23 MED ORDER — COLCHICINE 0.6 MG PO TABS
0.6000 mg | ORAL_TABLET | Freq: Every day | ORAL | 0 refills | Status: DC
Start: 2023-07-23 — End: 2023-08-20

## 2023-07-23 NOTE — Telephone Encounter (Signed)
 Pharmacy Patient Advocate Encounter   Received notification from Physician's Office that prior authorization for wegovy is required/requested.   Insurance verification completed.   The patient is insured through [employer] .   Per test claim: PA required; PA submitted to above mentioned insurance via CoverMyMeds Key/confirmation #/[id number] Status is pending

## 2023-07-24 ENCOUNTER — Other Ambulatory Visit: Payer: Self-pay

## 2023-07-24 NOTE — Telephone Encounter (Signed)
 [employer] DENIED COVERAGE AS PATIENT HAS PRIMARY INS. DENIAL UPLOADED TO EPIC. SUBMITTED TO [employer] WHICH DENIED DUE TO PLAN EXCLUSION. Pharmacy Patient Advocate Encounter  Received notification from [employer] [place]  that Prior Authorization for [facility] has been DENIED.  Full denial letter will be uploaded to the media tab. See denial reason below.   PA #/Case ID/Reference #: [id number]  **[employer] DENIAL LETTER MANUALLY FAXED TO [employer] IN HOPES THAT [employer] WILL RECONSIDER THEIR DECISION BASED ON OTHER COVERAGE DENIAL.

## 2023-07-28 NOTE — Progress Notes (Signed)
 Office Visit Note  Patient: Monica Cantu             Date of Birth: Oct 02, 1985           MRN: [id number]             PCP: Abraham Abo, MD Referring: Abraham Abo, MD Visit Date: 08/06/2023 Occupation: @GUAROCC @  Subjective:  Pain in muscles  History of Present Illness: Monica Cantu is a 38 y.o. female returns today after her initial visit on July 09, 2023.  She states she continues to have pain in her joints and muscles.  She has not noticed any joint swelling.  She continues to have lower back pain.  She has noted some improvement in her discomfort since she has been taking Cymbalta  20 mg a day.  She continues to have muscle pain all over.  She also gives history of hyperalgesia.  She denies any history of joint swelling.  Patient states she saw a podiatrist for dorsal spurs.  She had an cortisone injection in her left dorsal spur and also got some orthotics.  She will be getting some new shoes with better arch support.    Activities of Daily Living:  Patient reports morning stiffness for 1 hour.   Patient Reports nocturnal pain.  Difficulty dressing/grooming: Denies Difficulty climbing stairs: Denies Difficulty getting out of chair: Reports Difficulty using hands for taps, buttons, cutlery, and/or writing: Reports  Review of Systems  Constitutional:  Positive for fatigue.  HENT:  Negative for mouth sores and mouth dryness.   Eyes:  Negative for dryness.  Respiratory:  Negative for shortness of breath.   Cardiovascular:  Negative for chest pain and palpitations.  Gastrointestinal:  Negative for blood in stool, constipation and diarrhea.  Endocrine: Negative for increased urination.  Genitourinary:  Negative for involuntary urination.  Musculoskeletal:  Positive for joint pain, joint pain, myalgias, muscle weakness, morning stiffness, muscle tenderness and myalgias. Negative for gait problem and joint swelling.  Skin:  Negative for color change, rash, hair loss  and sensitivity to sunlight.  Allergic/Immunologic: Negative for susceptible to infections.  Neurological:  Negative for dizziness and headaches.  Hematological:  Negative for swollen glands.  Psychiatric/Behavioral:  Positive for depressed mood. Negative for sleep disturbance. The patient is not nervous/anxious.     PMFS History:  Patient Active Problem List   Diagnosis Date Noted   Allergic rhinitis 11/01/2021   Allergic rhinitis due to pollen 11/01/2021   Chronic allergic conjunctivitis 11/01/2021   Idiopathic urticaria 11/01/2021   Mild persistent asthma, uncomplicated 11/01/2021   Menorrhagia 07/27/2019   S/P laparoscopic sleeve gastrectomyNov 2018 03/02/2017   Low back pain radiating to left leg 09/30/2016   Multinodular goiter 05/27/2016   Annual physical exam 02/22/2016   PCP NOTES >>>>>>>>>>>>>>>>>>>>> 05/31/2015   Asthma 05/11/2015   Morbid obesity (HCC) 04/24/2015    Past Medical History:  Diagnosis Date   Allergy    Asthma    Chest pain    Hypertension    Multiple thyroid  nodules    per patient    PONV (postoperative nausea and vomiting)    with D+ C and tonsillectomy    Pre-diabetes     Family History  Problem Relation Age of Onset   Hypertension Mother    Scoliosis Mother    Hyperlipidemia Father    Diabetes Maternal Grandmother    Breast cancer Maternal Grandmother    Heart disease Paternal Grandmother    Hypertension Paternal Grandmother  Healthy Daughter    Healthy Son    Stroke Other    Colon cancer Neg Hx    Past Surgical History:  Procedure Laterality Date   DILATION AND CURETTAGE OF UTERUS     LAPAROSCOPIC GASTRIC SLEEVE RESECTION N/A 03/02/2017   Procedure: LAPAROSCOPIC GASTRIC SLEEVE RESECTION, UPPER ENDO;  Surgeon: Jacolyn Matar, MD;  Location: [facility];  Service: General;  Laterality: N/A;   TONSILLECTOMY AND ADENOIDECTOMY     Social History   Social History Narrative   G4P2  (2 miscarriages)    Household-- pt and 2 children   (2006, 2012)   Immunization History  Administered Date(s) Administered   Influenza, High Dose Seasonal PF 03/27/2020   Influenza,inj,Quad PF,6+ Mos 02/22/2016   Influenza-Unspecified 02/05/2018, 01/18/2019, 03/27/2020   Janssen (J&J) SARS-COV-2 Vaccination 07/14/2019, 03/27/2020   PFIZER(Purple Top)SARS-COV-2 Vaccination 03/27/2020   PPD Test 06/10/2019   Tdap 02/22/2016     Objective: Vital Signs: BP (!) 136/97 (BP Location: Left Arm, Patient Position: Sitting, Cuff Size: Large)   Pulse 88   Resp 16   Ht 5\' 5"  (1.651 m)   Wt (!) 317 lb (143.8 kg)   LMP 07/07/2023 (Approximate)   BMI 52.75 kg/m    Physical Exam Vitals and nursing note reviewed.  Constitutional:      Appearance: She is well-developed.  HENT:     Head: Normocephalic and atraumatic.  Eyes:     Conjunctiva/sclera: Conjunctivae normal.  Cardiovascular:     Rate and Rhythm: Normal rate and regular rhythm.     Heart sounds: Normal heart sounds.  Pulmonary:     Effort: Pulmonary effort is normal.     Breath sounds: Normal breath sounds.  Abdominal:     General: Bowel sounds are normal.     Palpations: Abdomen is soft.  Musculoskeletal:     Cervical back: Normal range of motion.  Lymphadenopathy:     Cervical: No cervical adenopathy.  Skin:    General: Skin is warm and dry.     Capillary Refill: Capillary refill takes less than 2 seconds.  Neurological:     Mental Status: She is alert and oriented to person, place, and time.  Psychiatric:        Behavior: Behavior normal.      Musculoskeletal Exam: Cervical spine with good range of motion.  Thoracic and lumbar spine were difficult to assess although she had discomfort in the lower lumbar region.  Shoulders, elbows, wrist, MCPs PIPs and DIPs were in good range of motion without any warmth swelling or effusion.  Hip joints and knee joints were in good range of motion without any warmth swelling or effusion.  There was no tenderness over ankles or MTPs.  She  had bilateral pes planus and dorsal spurs.  CDAI Exam: CDAI Score: -- Patient Global: --; Provider Global: -- Swollen: --; Tender: -- Joint Exam 08/06/2023   No joint exam has been documented for this visit   There is currently no information documented on the homunculus. Go to the Rheumatology activity and complete the homunculus joint exam.  Investigation: No additional findings.  Imaging: DG Foot Complete Left Result Date: 07/31/2023 Please see detailed radiograph report in office note.  XR Lumbar Spine 2-3 Views Result Date: 07/09/2023 No significant discussed narrowing was noted.  Facet joint arthropathy was noted.  Possible T11 vertebral wedging was noted.  No SI joint sclerosis or narrowing was noted. Impression: Lumbar facet joint arthropathy was noted.   Recent Labs: Lab Results  Component Value Date   WBC 7.7 02/11/2023   HGB 11.4 02/11/2023   PLT 292 02/11/2023   NA 142 02/11/2023   K 4.1 02/11/2023   CL 107 (H) 02/11/2023   CO2 19 (L) 02/11/2023   GLUCOSE 72 02/11/2023   BUN 11 02/11/2023   CREATININE 0.86 02/11/2023   BILITOT 0.3 02/11/2023   ALKPHOS 73 02/11/2023   AST 15 02/11/2023   ALT 13 02/11/2023   PROT 7.4 02/11/2023   ALBUMIN  4.5 02/11/2023   CALCIUM 9.4 02/11/2023   GFRAA >60 08/21/2019   QFTBGOLDPLUS NEGATIVE 12/11/2017   July 09, 2023 CK147, MCV negative, anti-CCP negative, ANA negative February 11, 2023 sed rate 79, uric acid 6.7, RF negative   Speciality Comments: No specialty comments available.  Procedures:  No procedures performed Allergies: Cephalexin  and Amoxicillin -pot clavulanate   Assessment / Plan:     Visit Diagnoses: Polyarthralgia -she complains of pain and discomfort in multiple joints.  No synovitis was noted.  All autoimmune workup was negative.  Lab results were discussed with the patient.   Chronic midline low back pain without sciatica - T11 wedging noted.  Facet joint arthropathy noted.  X-rays were reviewed with  the patient.  She continues to have lower back pain.  I will refer her to physical therapy.  A handout on back exercises was given.  Pes planus of both feet-patient was evaluated by a podiatrist.  She states she will be getting new orthotics and shoes with arch support.  She also has been having discomfort in the left foot dorsal spur.  She states she had a cortisone injection.  Fibromyalgia - Generalized hyperalgesia, positive tender points, CK normal.  Symptoms suggestive of myofascial pain syndrome/fibromyalgia syndrome.  Detailed counseling fibromyalgia syndrome was provided.  Patient has noted improvement since she has been currently taking Cymbalta  20 mg p.o. daily.  The dose of Cymbalta  can be gradually increased.  Benefits from water aerobics, swimming, stretching and yoga were discussed.  I will refer to physical therapy.  Idiopathic urticaria-no recurrence per patient.  Other medical problems are listed as follows:  Seasonal allergic rhinitis due to pollen  Mild persistent asthma, uncomplicated  Multinodular goiter  S/P laparoscopic sleeve gastrectomyNov 2018 - November 2018.  Morbid obesity (HCC)  Grieving  Orders: No orders of the defined types were placed in this encounter.  No orders of the defined types were placed in this encounter.    Follow-Up Instructions: Return if symptoms worsen or fail to improve, for Joint pain, myalgia.   Nicholas Bari, MD  Note - This record has been created using Animal nutritionist.  Chart creation errors have been sought, but may not always  have been located. Such creation errors do not reflect on  the standard of medical care.

## 2023-07-29 ENCOUNTER — Encounter: Payer: Self-pay | Admitting: Family Medicine

## 2023-07-29 NOTE — Progress Notes (Signed)
 Established Patient Office Visit  Subjective    Patient ID: Monica Cantu, female    DOB: [birth year]  Age: 38 y.o. MRN: [id number]  CC:  Chief Complaint  Patient presents with   Foot Pain    Right foot    HPI [name] presents with complaint of foot pain. She denies any known trauma or injury.   Outpatient Encounter Medications as of 07/23/2023  Medication Sig   albuterol  (VENTOLIN  HFA) 108 (90 Base) MCG/ACT inhaler Inhale 1-2 puffs into the lungs every 4 (four) hours as needed for wheezing or shortness of breath.   cetirizine  (ZYRTEC ) 10 MG tablet Take 1 tablet (10 mg total) by mouth daily.   clonazePAM  (KLONOPIN ) 0.5 MG tablet Take 1 tablet (0.5 mg total) by mouth 2 (two) times daily as needed for anxiety.   colchicine  0.6 MG tablet Take 1 tablet (0.6 mg total) by mouth daily.   DULoxetine  (CYMBALTA ) 20 MG capsule Take 1 capsule (20 mg total) by mouth daily.   EPINEPHrine 0.3 mg/0.3 mL IJ SOAJ injection See admin instructions.   FLOVENT  HFA 110 MCG/ACT inhaler Inhale 1 puff into the lungs as needed.   fluticasone  (FLONASE ) 50 MCG/ACT nasal spray Place 2 sprays into both nostrils daily.   levocetirizine (XYZAL ) 5 MG tablet Take 1 tablet (5 mg total) by mouth every evening.   pantoprazole  (PROTONIX ) 40 MG tablet Take 1 tablet (40 mg total) by mouth 2 (two) times daily.   diclofenac  Sodium (VOLTAREN ) 1 % GEL Apply 4 g topically 4 (four) times daily. (Patient not taking: Reported on 07/09/2023)   ondansetron  (ZOFRAN ) 4 MG tablet Take 1 tablet (4 mg total) by mouth every 8 (eight) hours as needed for nausea or vomiting. (Patient not taking: Reported on 07/09/2023)   phentermine  (ADIPEX-P ) 37.5 MG tablet Take 1 tablet (37.5 mg total) by mouth daily before breakfast. (Patient not taking: Reported on 07/09/2023)   Vitamin D, Ergocalciferol, (DRISDOL) 1.25 MG (50000 UNIT) CAPS capsule Take 50,000 Units by mouth once a week. (Patient not taking: Reported on 07/23/2023)   No  facility-administered encounter medications on file as of 07/23/2023.    Past Medical History:  Diagnosis Date   Allergy    Asthma    Chest pain    Hypertension    Multiple thyroid  nodules    per patient    PONV (postoperative nausea and vomiting)    with D+ C and tonsillectomy    Pre-diabetes     Past Surgical History:  Procedure Laterality Date   DILATION AND CURETTAGE OF UTERUS     LAPAROSCOPIC GASTRIC SLEEVE RESECTION N/A 03/02/2017   Procedure: LAPAROSCOPIC GASTRIC SLEEVE RESECTION, UPPER ENDO;  Surgeon: Jacolyn Matar, MD;  Location: [facility];  Service: General;  Laterality: N/A;   TONSILLECTOMY AND ADENOIDECTOMY      Family History  Problem Relation Age of Onset   Hypertension Mother    Scoliosis Mother    Hyperlipidemia Father    Diabetes Maternal Grandmother    Breast cancer Maternal Grandmother    Heart disease Paternal Grandmother    Hypertension Paternal Grandmother    Healthy Daughter    Healthy Son    Stroke Other    Colon cancer Neg Hx     Social History   Socioeconomic History   Marital status: Married    Spouse name: Not on file   Number of children: 2   Years of education: Not on file   Highest education level: Associate degree: occupational,  technical, or vocational program  Occupational History   Occupation: Personnel officer fro [employer]  Tobacco Use   Smoking status: Never    Passive exposure: Never   Smokeless tobacco: Never  Vaping Use   Vaping status: Never Used  Substance and Sexual Activity   Alcohol use: No    Alcohol/week: 0.0 standard drinks of alcohol   Drug use: No   Sexual activity: Yes    Partners: Male    Birth control/protection: None  Other Topics Concern   Not on file  Social History Narrative   G4P2  (2 miscarriages)    Household-- pt and 2 children  (2006, 2012)   Social Drivers of Corporate investment banker Strain: Low Risk  (05/11/2023)   Overall Financial Resource Strain (CARDIA)    Difficulty of  Paying Living Expenses: Not very hard  Food Insecurity: No Food Insecurity (05/11/2023)   Hunger Vital Sign    Worried About Running Out of Food in the Last Year: Never true    Ran Out of Food in the Last Year: Never true  Transportation Needs: No Transportation Needs (05/11/2023)   PRAPARE - Administrator, Civil Service (Medical): No    Lack of Transportation (Non-Medical): No  Physical Activity: Inactive (05/11/2023)   Exercise Vital Sign    Days of Exercise per Week: 0 days    Minutes of Exercise per Session: 0 min  Stress: Patient Declined (05/11/2023)   [employer] of Occupational Health - Occupational Stress Questionnaire    Feeling of Stress : Patient declined  Social Connections: Socially Integrated (05/11/2023)   Social Connection and Isolation Panel [NHANES]    Frequency of Communication with Friends and Family: More than three times a week    Frequency of Social Gatherings with Friends and Family: More than three times a week    Attends Religious Services: More than 4 times per year    Active Member of [employer] or Organizations: Yes    Attends Banker Meetings: More than 4 times per year    Marital Status: Married  Catering manager Violence: Not At Risk (03/25/2023)   Humiliation, Afraid, Rape, and Kick questionnaire    Fear of Current or Ex-Partner: No    Emotionally Abused: No    Physically Abused: No    Sexually Abused: No    Review of Systems  All other systems reviewed and are negative.       Objective    BP 103/72   Pulse 94   Temp 99.4 F (37.4 C) (Oral)   Resp 16   Ht 5\' 5"  (1.651 m)   Wt (!) 318 lb (144.2 kg)   LMP 07/07/2023 (Approximate)   SpO2 97%   BMI 52.92 kg/m   Physical Exam Vitals and nursing note reviewed.  Constitutional:      General: She is not in acute distress.    Appearance: She is obese.  Cardiovascular:     Rate and Rhythm: Normal rate and regular rhythm.  Pulmonary:     Effort: Pulmonary effort is  normal.     Breath sounds: Normal breath sounds.  Musculoskeletal:     Right foot: Decreased range of motion. Tenderness (great toe joint) present. No deformity.     Left foot: Decreased range of motion. Tenderness present. No deformity.  Neurological:     General: No focal deficit present.     Mental Status: She is alert and oriented to person, place, and time.  Psychiatric:  Mood and Affect: Mood normal.        Behavior: Behavior normal.         Assessment & Plan:  1. Right foot pain (Primary) Clinically appears to be gout. Discussed dietary options. Colchicine  prescribed.   2. Left foot pain Clinically appears to be like plantar fasciitis. Referral for further eval/mgt - Ambulatory referral to Podiatry    No follow-ups on file.   Arlo Lama, MD

## 2023-07-31 ENCOUNTER — Encounter: Payer: Self-pay | Admitting: Podiatrist

## 2023-07-31 ENCOUNTER — Ambulatory Visit (INDEPENDENT_AMBULATORY_CARE_PROVIDER_SITE_OTHER)

## 2023-07-31 ENCOUNTER — Ambulatory Visit: Admitting: Podiatry

## 2023-07-31 DIAGNOSIS — M7752 Other enthesopathy of left foot: Secondary | ICD-10-CM

## 2023-07-31 DIAGNOSIS — Q666 Other congenital valgus deformities of feet: Secondary | ICD-10-CM

## 2023-07-31 DIAGNOSIS — M19072 Primary osteoarthritis, left ankle and foot: Secondary | ICD-10-CM

## 2023-07-31 DIAGNOSIS — M19079 Primary osteoarthritis, unspecified ankle and foot: Secondary | ICD-10-CM

## 2023-07-31 NOTE — Progress Notes (Signed)
 Subjective:  Patient ID: Monica Cantu, female    DOB: [birth year],  MRN: [id number]  Chief Complaint  Patient presents with   Foot Pain    RM#1 Left foot pain for years pain is located at the top of foot when wearing tennis shoes is only able to wear flats.    38 y.o. female presents with the above complaint.  Patient presents with left dorsal midfoot pain that has been going for quite some time is progressive gotten worse.  She states that her foot pain is worse than when she is wearing flats.  Wearing tennis shoe does give her more support.  She denies seeing anyone else prior to seeing me denies any other acute complaints pain scale 7 out of 10 dull achy in nature.   Review of Systems: Negative except as noted in the HPI. Denies N/V/F/Ch.  Past Medical History:  Diagnosis Date   Allergy    Asthma    Chest pain    Hypertension    Multiple thyroid  nodules    per patient    PONV (postoperative nausea and vomiting)    with D+ C and tonsillectomy    Pre-diabetes     Current Outpatient Medications:    albuterol  (VENTOLIN  HFA) 108 (90 Base) MCG/ACT inhaler, Inhale 1-2 puffs into the lungs every 4 (four) hours as needed for wheezing or shortness of breath., Disp: 18 g, Rfl: 5   cetirizine  (ZYRTEC ) 10 MG tablet, Take 1 tablet (10 mg total) by mouth daily., Disp: 90 tablet, Rfl: 1   clonazePAM  (KLONOPIN ) 0.5 MG tablet, Take 1 tablet (0.5 mg total) by mouth 2 (two) times daily as needed for anxiety., Disp: 20 tablet, Rfl: 1   colchicine  0.6 MG tablet, Take 1 tablet (0.6 mg total) by mouth daily., Disp: 30 tablet, Rfl: 0   diclofenac  Sodium (VOLTAREN ) 1 % GEL, Apply 4 g topically 4 (four) times daily., Disp: 350 g, Rfl: 3   DULoxetine  (CYMBALTA ) 20 MG capsule, Take 1 capsule (20 mg total) by mouth daily., Disp: 30 capsule, Rfl: 1   EPINEPHrine 0.3 mg/0.3 mL IJ SOAJ injection, See admin instructions., Disp: , Rfl:    FLOVENT  HFA 110 MCG/ACT inhaler, Inhale 1 puff into the lungs as  needed., Disp: 1 each, Rfl: 2   fluticasone  (FLONASE ) 50 MCG/ACT nasal spray, Place 2 sprays into both nostrils daily., Disp: 18 g, Rfl: 2   levocetirizine (XYZAL ) 5 MG tablet, Take 1 tablet (5 mg total) by mouth every evening., Disp: 90 tablet, Rfl: 1   ondansetron  (ZOFRAN ) 4 MG tablet, Take 1 tablet (4 mg total) by mouth every 8 (eight) hours as needed for nausea or vomiting., Disp: 20 tablet, Rfl: 0   pantoprazole  (PROTONIX ) 40 MG tablet, Take 1 tablet (40 mg total) by mouth 2 (two) times daily., Disp: 180 tablet, Rfl: 1   phentermine  (ADIPEX-P ) 37.5 MG tablet, Take 1 tablet (37.5 mg total) by mouth daily before breakfast., Disp: 30 tablet, Rfl: 0   Vitamin D, Ergocalciferol, (DRISDOL) 1.25 MG (50000 UNIT) CAPS capsule, Take 50,000 Units by mouth once a week. (Patient not taking: Reported on 08/06/2023), Disp: , Rfl:    DULoxetine  (CYMBALTA ) 60 MG capsule, Take 1 capsule (60 mg total) by mouth daily., Disp: 90 capsule, Rfl: 0  Social History   Tobacco Use  Smoking Status Never   Passive exposure: Never  Smokeless Tobacco Never    Allergies  Allergen Reactions   Cephalexin  Other (See Comments)   Amoxicillin -Pot Clavulanate Diarrhea, Other (  See Comments), Nausea Only and Rash   Objective:  There were no vitals filed for this visit. There is no height or weight on file to calculate BMI. Constitutional Well developed. Well nourished.  Vascular Dorsalis pedis pulses palpable bilaterally. Posterior tibial pulses palpable bilaterally. Capillary refill normal to all digits.  No cyanosis or clubbing noted. Pedal hair growth normal.  Neurologic Normal speech. Oriented to person, place, and time. Epicritic sensation to light touch grossly present bilaterally.  Dermatologic Nails well groomed and normal in appearance. No open wounds. No skin lesions.  Orthopedic: Pain on palpation to the left dorsal hindfoot.  Prominent spot noted with pain on palpation to the spot pain with range of  motion of the tarsometatarsal joint.  No Lisfranc injury noted.  Some clinically able to appreciate some arthritis.   Radiographs: 3 views skeletally mature adult left foot: Osteophytes and loosening noted on the dorsal aspect of the navicular.  Midfoot arthritis noted.  Appears to be talonavicular joint arthritis. Assessment:   1. Osteoarthritis of talonavicular joint   2. Pes planovalgus    Plan:  Patient was evaluated and treated and all questions answered.  Left talonavicular joint arthritis - All questions and concerns were discussed with the patient extensive detail given the amount of pain that she is experiencing she will benefit from a steroid injection to help decrease inflammatory component surgical pain.  Patient agrees with plan like to proceed with steroid injection - Shoe gear modification extensively discussed. -A steroid injection was performed at left talonavicular joint using 1% plain Lidocaine  and 10 mg of Kenalog. This was well tolerated.   Pes planovalgus -I explained to patient the etiology of pes planovalgus and relationship with Planter fasciitis and various treatment options were discussed.  Given patient foot structure in the setting of Planter fasciitis I believe patient will benefit from custom-made orthotics to help control the hindfoot motion support the arch of the foot and take the stress away from plantar fascial.  Patient agrees with the plan like to proceed with orthotics -Patient was casted for orthotics

## 2023-07-31 NOTE — Progress Notes (Signed)
 error

## 2023-08-03 ENCOUNTER — Other Ambulatory Visit: Payer: Self-pay

## 2023-08-06 ENCOUNTER — Encounter: Payer: Self-pay | Admitting: Rheumatology

## 2023-08-06 ENCOUNTER — Other Ambulatory Visit: Payer: Self-pay | Admitting: Family Medicine

## 2023-08-06 ENCOUNTER — Telehealth: Payer: Self-pay | Admitting: *Deleted

## 2023-08-06 ENCOUNTER — Ambulatory Visit: Payer: BC Managed Care – PPO | Attending: Rheumatology | Admitting: Rheumatology

## 2023-08-06 VITALS — BP 136/97 | HR 88 | Resp 16 | Ht 65.0 in | Wt 317.0 lb

## 2023-08-06 DIAGNOSIS — J301 Allergic rhinitis due to pollen: Secondary | ICD-10-CM

## 2023-08-06 DIAGNOSIS — F4321 Adjustment disorder with depressed mood: Secondary | ICD-10-CM

## 2023-08-06 DIAGNOSIS — J4530 Mild persistent asthma, uncomplicated: Secondary | ICD-10-CM

## 2023-08-06 DIAGNOSIS — M797 Fibromyalgia: Secondary | ICD-10-CM | POA: Diagnosis not present

## 2023-08-06 DIAGNOSIS — Z9884 Bariatric surgery status: Secondary | ICD-10-CM

## 2023-08-06 DIAGNOSIS — G8929 Other chronic pain: Secondary | ICD-10-CM

## 2023-08-06 DIAGNOSIS — M545 Low back pain, unspecified: Secondary | ICD-10-CM

## 2023-08-06 DIAGNOSIS — M2550 Pain in unspecified joint: Secondary | ICD-10-CM | POA: Diagnosis not present

## 2023-08-06 DIAGNOSIS — L501 Idiopathic urticaria: Secondary | ICD-10-CM

## 2023-08-06 DIAGNOSIS — M2142 Flat foot [pes planus] (acquired), left foot: Secondary | ICD-10-CM

## 2023-08-06 DIAGNOSIS — M2141 Flat foot [pes planus] (acquired), right foot: Secondary | ICD-10-CM | POA: Diagnosis not present

## 2023-08-06 DIAGNOSIS — E042 Nontoxic multinodular goiter: Secondary | ICD-10-CM

## 2023-08-06 DIAGNOSIS — M791 Myalgia, unspecified site: Secondary | ICD-10-CM

## 2023-08-06 MED ORDER — DULOXETINE HCL 60 MG PO CPEP
60.0000 mg | ORAL_CAPSULE | Freq: Every day | ORAL | 0 refills | Status: DC
Start: 2023-08-06 — End: 2023-09-13

## 2023-08-06 NOTE — Patient Instructions (Signed)
 Low Back Sprain or Strain Rehab Ask your health care provider which exercises are safe for you. Do exercises exactly as told by your health care provider and adjust them as directed. It is normal to feel mild stretching, pulling, tightness, or discomfort as you do these exercises. Stop right away if you feel sudden pain or your pain gets worse. Do not begin these exercises until told by your health care provider. Stretching and range-of-motion exercises These exercises warm up your muscles and joints and improve the movement and flexibility of your back. These exercises also help to relieve pain, numbness, and tingling. Lumbar rotation  Lie on your back on a firm bed or the floor with your knees bent. Straighten your arms out to your sides so each arm forms a 90-degree angle (right angle) with a side of your body. Slowly move (rotate) both of your knees to one side of your body until you feel a stretch in your lower back (lumbar). Try not to let your shoulders lift off the floor. Hold this position for __________ seconds. Tense your abdominal muscles and slowly move your knees back to the starting position. Repeat this exercise on the other side of your body. Repeat __________ times. Complete this exercise __________ times a day. Single knee to chest  Lie on your back on a firm bed or the floor with both legs straight. Bend one of your knees. Use your hands to move your knee up toward your chest until you feel a gentle stretch in your lower back and buttock. Hold your leg in this position by holding on to the front of your knee. Keep your other leg as straight as possible. Hold this position for __________ seconds. Slowly return to the starting position. Repeat with your other leg. Repeat __________ times. Complete this exercise __________ times a day. Prone extension on elbows  Lie on your abdomen on a firm bed or the floor (prone position). Prop yourself up on your elbows. Use your arms  to help lift your chest up until you feel a gentle stretch in your abdomen and your lower back. This will place some of your body weight on your elbows. If this is uncomfortable, try stacking pillows under your chest. Your hips should stay down, against the surface that you are lying on. Keep your hip and back muscles relaxed. Hold this position for __________ seconds. Slowly relax your upper body and return to the starting position. Repeat __________ times. Complete this exercise __________ times a day. Strengthening exercises These exercises build strength and endurance in your back. Endurance is the ability to use your muscles for a long time, even after they get tired. Pelvic tilt This exercise strengthens the muscles that lie deep in the abdomen. Lie on your back on a firm bed or the floor with your legs extended. Bend your knees so they are pointing toward the ceiling and your feet are flat on the floor. Tighten your lower abdominal muscles to press your lower back against the floor. This motion will tilt your pelvis so your tailbone points up toward the ceiling instead of pointing to your feet or the floor. To help with this exercise, you may place a small towel under your lower back and try to push your back into the towel. Hold this position for __________ seconds. Let your muscles relax completely before you repeat this exercise. Repeat __________ times. Complete this exercise __________ times a day. Alternating arm and leg raises  Get on your hands  and knees on a firm surface. If you are on a hard floor, you may want to use padding, such as an exercise mat, to cushion your knees. Line up your arms and legs. Your hands should be directly below your shoulders, and your knees should be directly below your hips. Lift your left leg behind you. At the same time, raise your right arm and straighten it in front of you. Do not lift your leg higher than your hip. Do not lift your arm higher  than your shoulder. Keep your abdominal and back muscles tight. Keep your hips facing the ground. Do not arch your back. Keep your balance carefully, and do not hold your breath. Hold this position for __________ seconds. Slowly return to the starting position. Repeat with your right leg and your left arm. Repeat __________ times. Complete this exercise __________ times a day. Abdominal set with straight leg raise  Lie on your back on a firm bed or the floor. Bend one of your knees and keep your other leg straight. Tense your abdominal muscles and lift your straight leg up, 4-6 inches (10-15 cm) off the ground. Keep your abdominal muscles tight and hold this position for __________ seconds. Do not hold your breath. Do not arch your back. Keep it flat against the ground. Keep your abdominal muscles tense as you slowly lower your leg back to the starting position. Repeat with your other leg. Repeat __________ times. Complete this exercise __________ times a day. Single leg lower with bent knees Lie on your back on a firm bed or the floor. Tense your abdominal muscles and lift your feet off the floor, one foot at a time, so your knees and hips are bent in 90-degree angles (right angles). Your knees should be over your hips and your lower legs should be parallel to the floor. Keeping your abdominal muscles tense and your knee bent, slowly lower one of your legs so your toe touches the ground. Lift your leg back up to return to the starting position. Do not hold your breath. Do not let your back arch. Keep your back flat against the ground. Repeat with your other leg. Repeat __________ times. Complete this exercise __________ times a day. Posture and body mechanics Good posture and healthy body mechanics can help to relieve stress in your body's tissues and joints. Body mechanics refers to the movements and positions of your body while you do your daily activities. Posture is part of body  mechanics. Good posture means: Your spine is in its natural S-curve position (neutral). Your shoulders are pulled back slightly. Your head is not tipped forward (neutral). Follow these guidelines to improve your posture and body mechanics in your everyday activities. Standing  When standing, keep your spine neutral and your feet about hip-width apart. Keep a slight bend in your knees. Your ears, shoulders, and hips should line up. When you do a task in which you stand in one place for a long time, place one foot up on a stable object that is 2-4 inches (5-10 cm) high, such as a footstool. This helps keep your spine neutral. Sitting  When sitting, keep your spine neutral and keep your feet flat on the floor. Use a footrest, if necessary, and keep your thighs parallel to the floor. Avoid rounding your shoulders, and avoid tilting your head forward. When working at a desk or a computer, keep your desk at a height where your hands are slightly lower than your elbows. Slide your  chair under your desk so you are close enough to maintain good posture. When working at a computer, place your monitor at a height where you are looking straight ahead and you do not have to tilt your head forward or downward to look at the screen. Resting When lying down and resting, avoid positions that are most painful for you. If you have pain with activities such as sitting, bending, stooping, or squatting, lie in a position in which your body does not bend very much. For example, avoid curling up on your side with your arms and knees near your chest (fetal position). If you have pain with activities such as standing for a long time or reaching with your arms, lie with your spine in a neutral position and bend your knees slightly. Try the following positions: Lying on your side with a pillow between your knees. Lying on your back with a pillow under your knees. Lifting  When lifting objects, keep your feet at least  shoulder-width apart and tighten your abdominal muscles. Bend your knees and hips and keep your spine neutral. It is important to lift using the strength of your legs, not your back. Do not lock your knees straight out. Always ask for help to lift heavy or awkward objects. This information is not intended to replace advice given to you by your health care provider. Make sure you discuss any questions you have with your health care provider. Document Revised: 07/28/2022 Document Reviewed: 06/11/2020 Elsevier Patient Education  2024 [employer].

## 2023-08-06 NOTE — Telephone Encounter (Signed)
Referral placed per Dr. Estanislado Pandy

## 2023-08-14 ENCOUNTER — Other Ambulatory Visit: Payer: Self-pay | Admitting: Family Medicine

## 2023-08-14 ENCOUNTER — Telehealth: Payer: Self-pay

## 2023-08-14 NOTE — Telephone Encounter (Signed)
 LVM to schedule orthotic PU

## 2023-08-18 ENCOUNTER — Ambulatory Visit (INDEPENDENT_AMBULATORY_CARE_PROVIDER_SITE_OTHER)

## 2023-08-18 DIAGNOSIS — M2142 Flat foot [pes planus] (acquired), left foot: Secondary | ICD-10-CM

## 2023-08-18 DIAGNOSIS — M19079 Primary osteoarthritis, unspecified ankle and foot: Secondary | ICD-10-CM | POA: Diagnosis not present

## 2023-08-18 DIAGNOSIS — M2141 Flat foot [pes planus] (acquired), right foot: Secondary | ICD-10-CM | POA: Diagnosis not present

## 2023-08-18 NOTE — Progress Notes (Signed)
 Patient presents today to pick up custom molded foot orthotics, diagnosed with OA Talo-navicular by Dr. Lydia Sams.   Orthotics were dispensed and fit was satisfactory. Reviewed instructions for break-in and wear. Written instructions given to patient.  Patient will follow up as needed.   Britton Cane Cped, CFo, CFm

## 2023-08-22 ENCOUNTER — Ambulatory Visit

## 2023-09-13 ENCOUNTER — Ambulatory Visit
Admission: EM | Admit: 2023-09-13 | Discharge: 2023-09-13 | Disposition: A | Discharge status: Home / Self Care | Attending: Physician Assistant | Admitting: Physician Assistant

## 2023-09-13 DIAGNOSIS — N3000 Acute cystitis without hematuria: Secondary | ICD-10-CM | POA: Diagnosis not present

## 2023-09-13 LAB — POCT URINALYSIS DIP (MANUAL ENTRY)
Bilirubin, UA: NEGATIVE
Glucose, UA: NEGATIVE mg/dL
Ketones, POC UA: NEGATIVE mg/dL
Nitrite, UA: NEGATIVE
Spec Grav, UA: >=1.03 — AB (ref 1.010–1.025)
Urobilinogen, UA: 1 U/dL
pH, UA: 6 (ref 5.0–8.0)

## 2023-09-13 MED ORDER — NITROFURANTOIN MONOHYD MACRO 100 MG PO CAPS: 100.0000 mg | ORAL_CAPSULE | Freq: Two times a day (BID) | ORAL | 0 refills | Status: DC

## 2023-09-13 NOTE — ED Provider Notes (Signed)
 [name]    CSN: [id number] Arrival date & time: 09/13/23  1519      History   Chief Complaint Chief Complaint  Patient presents with   Abdominal Pain   UTI Symptoms    HPI Monica Cantu is a 38 y.o. female.   Patient here today for evaluation of dysuria and right-sided lower back pain that started about a week ago.  She reports she is also had some spotting since this started and is not sure if this is related.  She feels certain she has UTI.  She cannot rule out pregnancy but has not had missed period. She has not had any fever.  The history is provided by the patient.  Abdominal Pain Associated symptoms: dysuria   Associated symptoms: no chills, no fever, no nausea, no shortness of breath and no vomiting     Past Medical History:  Diagnosis Date   Allergy    Asthma    Chest pain    Hypertension    Multiple thyroid  nodules    per patient    PONV (postoperative nausea and vomiting)    with D+ C and tonsillectomy    Pre-diabetes     Patient Active Problem List   Diagnosis Date Noted   Other allergic rhinitis 11/01/2021   Allergic rhinitis due to pollen 11/01/2021   Chronic allergic conjunctivitis 11/01/2021   Idiopathic urticaria 11/01/2021   Mild persistent asthma without complication 11/01/2021   Menorrhagia 07/27/2019   S/P laparoscopic sleeve gastrectomyNov 2018 03/02/2017   Low back pain radiating to left leg 09/30/2016   Multinodular goiter 05/27/2016   Annual physical exam 02/22/2016   PCP NOTES >>>>>>>>>>>>>>>>>>>>> 05/31/2015   Asthma 05/11/2015   Morbid obesity (HCC) 04/24/2015    Past Surgical History:  Procedure Laterality Date   DILATION AND CURETTAGE OF UTERUS     LAPAROSCOPIC GASTRIC SLEEVE RESECTION N/A 03/02/2017   Procedure: LAPAROSCOPIC GASTRIC SLEEVE RESECTION, UPPER ENDO;  Surgeon: Jacolyn Matar, MD;  Location: [facility];  Service: General;  Laterality: N/A;   TONSILLECTOMY AND ADENOIDECTOMY      OB History      Gravida  4   Para  2   Term  2   Preterm  0   AB  2   Living  2      SAB  2   IAB      Ectopic  0   Multiple      Live Births  2            Home Medications    Prior to Admission medications   Medication Sig Start Date End Date Taking? Authorizing Provider  albuterol  (VENTOLIN  HFA) 108 (90 Base) MCG/ACT inhaler Inhale 1-2 puffs into the lungs every 4 (four) hours as needed for wheezing or shortness of breath. 07/09/23   Abraham Abo, MD  cetirizine  (ZYRTEC ) 10 MG tablet Take 1 tablet (10 mg total) by mouth daily. 07/09/23   Abraham Abo, MD  clonazePAM  (KLONOPIN ) 0.5 MG tablet Take 1 tablet (0.5 mg total) by mouth 2 (two) times daily as needed for anxiety. 04/15/23   Abraham Abo, MD  colchicine  0.6 MG tablet TAKE 1 TABLET BY MOUTH EVERY DAY 08/20/23   Abraham Abo, MD  DULoxetine  (CYMBALTA ) 20 MG capsule Take 1 capsule (20 mg total) by mouth daily. 07/09/23   Abraham Abo, MD  EPINEPHrine 0.3 mg/0.3 mL IJ SOAJ injection See admin instructions. 09/07/20   [provider]  FLOVENT  HFA 110 MCG/ACT  inhaler Inhale 1 puff into the lungs as needed. 07/09/23   Abraham Abo, MD  fluticasone  (FLONASE ) 50 MCG/ACT nasal spray Place 2 sprays into both nostrils daily. 07/09/23   Abraham Abo, MD  levocetirizine (XYZAL ) 5 MG tablet Take 1 tablet (5 mg total) by mouth every evening. 07/09/23   Abraham Abo, MD  nitrofurantoin , macrocrystal-monohydrate, (MACROBID ) 100 MG capsule Take 1 capsule (100 mg total) by mouth 2 (two) times daily. 09/13/23   Vernestine Gondola, PA-C  ondansetron  (ZOFRAN ) 4 MG tablet Take 1 tablet (4 mg total) by mouth every 8 (eight) hours as needed for nausea or vomiting. 05/19/23   Abraham Abo, MD  pantoprazole  (PROTONIX ) 40 MG tablet Take 1 tablet (40 mg total) by mouth 2 (two) times daily. 07/09/23   Abraham Abo, MD  phentermine  (ADIPEX-P ) 37.5 MG tablet Take 1 tablet (37.5 mg total) by mouth daily before breakfast. 08/16/21   Abraham Abo, MD   Vitamin D, Ergocalciferol, (DRISDOL) 1.25 MG (50000 UNIT) CAPS capsule Take 50,000 Units by mouth once a week. Patient not taking: Reported on 08/06/2023 10/23/20   [provider]    Family History Family History  Problem Relation Age of Onset   Hypertension Mother    Scoliosis Mother    Hyperlipidemia Father    Diabetes Maternal Grandmother    Breast cancer Maternal Grandmother    Heart disease Paternal Grandmother    Hypertension Paternal Grandmother    Healthy Daughter    Healthy Son    Stroke Other    Colon cancer Neg Hx     Social History Social History   Tobacco Use   Smoking status: Never    Passive exposure: Never   Smokeless tobacco: Never  Vaping Use   Vaping status: Never Used  Substance Use Topics   Alcohol use: No    Alcohol/week: 0.0 standard drinks of alcohol   Drug use: No     Allergies   Cephalexin  and Amoxicillin -pot clavulanate   Review of Systems Review of Systems  Constitutional:  Negative for chills and fever.  Eyes:  Negative for discharge and redness.  Respiratory:  Negative for shortness of breath.   Gastrointestinal:  Positive for abdominal pain. Negative for nausea and vomiting.  Genitourinary:  Positive for dysuria.     Physical Exam Triage Vital Signs ED Triage Vitals  Encounter Vitals Group     BP --      Systolic BP Percentile --      Diastolic BP Percentile --      Pulse --      Resp --      Temp --      Temp src --      SpO2 --      Weight 09/13/23 1538 (!) 324 lb (147 kg)     Height 09/13/23 1538 5\' 5"  (1.651 m)     Head Circumference --      Peak Flow --      Pain Score 09/13/23 1536 8     Pain Loc --      Pain Education --      Exclude from Growth Chart --    No data found.  Updated Vital Signs BP 108/73 (BP Location: Left Arm)   Pulse 91   Temp 98.7 F (37.1 C) (Oral)   Resp 20   Ht 5\' 5"  (1.651 m)   Wt (!) 324 lb (147 kg)   LMP 08/20/2023 (Exact Date)   SpO2 100%   BMI 53.92  kg/m    Visual Acuity Right Eye Distance:   Left Eye Distance:   Bilateral Distance:    Right Eye Near:   Left Eye Near:    Bilateral Near:     Physical Exam Vitals and nursing note reviewed.  Constitutional:      General: She is not in acute distress.    Appearance: Normal appearance. She is not ill-appearing.  HENT:     Head: Normocephalic and atraumatic.  Eyes:     Conjunctiva/sclera: Conjunctivae normal.  Cardiovascular:     Rate and Rhythm: Normal rate.  Pulmonary:     Effort: Pulmonary effort is normal. No respiratory distress.  Neurological:     Mental Status: She is alert.  Psychiatric:        Mood and Affect: Mood normal.        Behavior: Behavior normal.        Thought Content: Thought content normal.      UC Treatments / Results  Labs (all labs ordered are listed, but only abnormal results are displayed) Labs Reviewed  POCT URINALYSIS DIP (MANUAL ENTRY) - Abnormal; Notable for the following components:      Result Value   Clarity, UA cloudy (*)    Spec Grav, UA >=1.030 (*)    Blood, UA large (*)    Protein Ur, POC trace (*)    Leukocytes, UA Trace (*)    All other components within normal limits  URINE CULTURE    EKG   Radiology No results found.  Procedures Procedures (including critical care time)  Medications Ordered in UC Medications - No data to display  Initial Impression / Assessment and Plan / UC Course  I have reviewed the triage vital signs and the nursing notes.  Pertinent labs & imaging results that were available during my care of the patient were reviewed by me and considered in my medical decision making (see chart for details).    Will treat to cover UTI with Macrobid .  Advised patient check her pregnancy test at home if she does not start her period when expected. Urine culture ordered. Encouraged follow up if no gradual improvement or with any further concerns.   Final Clinical Impressions(s) / UC Diagnoses   Final  diagnoses:  Acute cystitis without hematuria   Discharge Instructions   None    ED Prescriptions     Medication Sig Dispense Auth. Provider   nitrofurantoin , macrocrystal-monohydrate, (MACROBID ) 100 MG capsule  (Status: Discontinued) Take 1 capsule (100 mg total) by mouth 2 (two) times daily. 10 capsule Vernestine Gondola, PA-C   nitrofurantoin , macrocrystal-monohydrate, (MACROBID ) 100 MG capsule Take 1 capsule (100 mg total) by mouth 2 (two) times daily. 10 capsule Vernestine Gondola, PA-C      PDMP not reviewed this encounter.   Vernestine Gondola, PA-C 09/13/23 1606

## 2023-09-13 NOTE — ED Triage Notes (Signed)
"  I am having some pain with urination and lower right sided back pain with abd pain & spotting for about a week". "I am sure I have a UTI". No fever.

## 2023-09-14 LAB — URINE CULTURE

## 2023-09-16 ENCOUNTER — Ambulatory Visit (HOSPITAL_COMMUNITY): Payer: Self-pay

## 2023-09-30 ENCOUNTER — Other Ambulatory Visit: Payer: Self-pay | Admitting: Family Medicine

## 2023-09-30 MED ORDER — WEGOVY 0.5 MG/0.5ML ~~LOC~~ SOAJ: 0.5000 mg | SUBCUTANEOUS | 0 refills | Status: DC

## 2023-09-30 NOTE — Telephone Encounter (Signed)
 Copied from CRM [id number]. Topic: Clinical - Medication Refill >> Sep 30, 2023  2:55 PM Emylou G wrote: Medication: Semaglutide-Weight Management (WEGOVY) 0.5 MG/0.5ML SOAJ  Has the patient contacted their pharmacy? Yes (Agent: If no, request that the patient contact the pharmacy for the refill. If patient does not wish to contact the pharmacy document the reason why and proceed with request.) (Agent: If yes, when and what did the pharmacy advise?) called us   This is the patient's preferred pharmacy:   [facility] - [place], [place] - [address] [place] [address] Phone: [phone] Fax: [phone]  Is this the correct pharmacy for this prescription? Yes If no, delete pharmacy and type the correct one.   Has the prescription been filled recently? No  Is the patient out of the medication? Yes  Has the patient been seen for an appointment in the last year OR does the patient have an upcoming appointment? Yes  Can we respond through MyChart? No  Agent: Please be advised that Rx refills may take up to 3 business days. We ask that you follow-up with your pharmacy.

## 2023-10-02 ENCOUNTER — Encounter (HOSPITAL_COMMUNITY): Payer: Self-pay | Admitting: *Deleted

## 2023-10-07 ENCOUNTER — Other Ambulatory Visit: Payer: Self-pay

## 2023-10-07 ENCOUNTER — Encounter (HOSPITAL_BASED_OUTPATIENT_CLINIC_OR_DEPARTMENT_OTHER): Payer: Self-pay | Admitting: Emergency Medicine

## 2023-10-07 ENCOUNTER — Emergency Department (HOSPITAL_BASED_OUTPATIENT_CLINIC_OR_DEPARTMENT_OTHER): Admission: EM | Admit: 2023-10-07 | Discharge: 2023-10-08 | Disposition: A | Discharge status: Home / Self Care

## 2023-10-07 ENCOUNTER — Emergency Department (HOSPITAL_BASED_OUTPATIENT_CLINIC_OR_DEPARTMENT_OTHER)

## 2023-10-07 DIAGNOSIS — J45909 Unspecified asthma, uncomplicated: Secondary | ICD-10-CM | POA: Diagnosis not present

## 2023-10-07 DIAGNOSIS — R0789 Other chest pain: Secondary | ICD-10-CM | POA: Diagnosis not present

## 2023-10-07 DIAGNOSIS — E049 Nontoxic goiter, unspecified: Secondary | ICD-10-CM | POA: Diagnosis not present

## 2023-10-07 DIAGNOSIS — R079 Chest pain, unspecified: Secondary | ICD-10-CM | POA: Diagnosis not present

## 2023-10-07 DIAGNOSIS — I10 Essential (primary) hypertension: Secondary | ICD-10-CM | POA: Diagnosis not present

## 2023-10-07 LAB — CBC WITH DIFFERENTIAL/PLATELET
Abs Immature Granulocytes: 0.04 10*3/uL (ref 0.00–0.07)
Basophils Absolute: 0.1 10*3/uL (ref 0.0–0.1)
Basophils Relative: 1 %
Eosinophils Absolute: 0.2 10*3/uL (ref 0.0–0.5)
Eosinophils Relative: 3 %
HCT: 30.9 % — ABNORMAL LOW (ref 36.0–46.0)
Hemoglobin: 9.2 g/dL — ABNORMAL LOW (ref 12.0–15.0)
Immature Granulocytes: 1 %
Lymphocytes Relative: 31 %
Lymphs Abs: 2.6 10*3/uL (ref 0.7–4.0)
MCH: 21.4 pg — ABNORMAL LOW (ref 26.0–34.0)
MCHC: 29.8 g/dL — ABNORMAL LOW (ref 30.0–36.0)
MCV: 71.9 fL — ABNORMAL LOW (ref 80.0–100.0)
Monocytes Absolute: 0.7 10*3/uL (ref 0.1–1.0)
Monocytes Relative: 8 %
Neutro Abs: 4.6 10*3/uL (ref 1.7–7.7)
Neutrophils Relative %: 56 %
Platelets: 296 10*3/uL (ref 150–400)
RBC: 4.3 MIL/uL (ref 3.87–5.11)
RDW: 18.2 % — ABNORMAL HIGH (ref 11.5–15.5)
WBC: 8.2 10*3/uL (ref 4.0–10.5)
nRBC: 0 % (ref 0.0–0.2)

## 2023-10-07 LAB — BASIC METABOLIC PANEL WITH GFR
Anion gap: 14 (ref 5–15)
BUN: 12 mg/dL (ref 6–20)
CO2: 20 mmol/L — ABNORMAL LOW (ref 22–32)
Calcium: 9.4 mg/dL (ref 8.9–10.3)
Chloride: 104 mmol/L (ref 98–111)
Creatinine, Ser: 0.86 mg/dL (ref 0.44–1.00)
GFR, Estimated: >60 mL/min (ref >60)
Glucose, Bld: 91 mg/dL (ref 70–99)
Potassium: 3.3 mmol/L — ABNORMAL LOW (ref 3.5–5.1)
Sodium: 138 mmol/L (ref 135–145)

## 2023-10-07 LAB — TROPONIN T, HIGH SENSITIVITY: Troponin T High Sensitivity: <15 ng/L (ref <19)

## 2023-10-07 NOTE — ED Provider Notes (Signed)
 [facility]GENC[facility] DEPT [facility] Emergency Department Provider Note MRN:  [id number]  Arrival date & time: 10/08/23     Chief Complaint   Chest Pain   History of Present Illness   Monica Cantu is a 38 y.o. year-old female with a history of attention presenting to the ED with chief complaint of chest pain.  Chest pain right side of the chest described as sharp and also dull.  Worse with deep breath, worse with movement.  Denies any reason for muscle strain.  No recent fever or cough.  No dizziness or diaphoresis, no nausea or vomiting, no trouble breathing.  No recent leg pain or swelling.  No history of blood clots, no history of heart problems.  Review of Systems  A thorough review of systems was obtained and all systems are negative except as noted in the HPI and PMH.   Patient's Health History    Past Medical History:  Diagnosis Date   Allergy    Asthma    Chest pain    Hypertension    Multiple thyroid  nodules    per patient    PONV (postoperative nausea and vomiting)    with D+ C and tonsillectomy    Pre-diabetes     Past Surgical History:  Procedure Laterality Date   DILATION AND CURETTAGE OF UTERUS     LAPAROSCOPIC GASTRIC SLEEVE RESECTION N/A 03/02/2017   Procedure: LAPAROSCOPIC GASTRIC SLEEVE RESECTION, UPPER ENDO;  Surgeon: Gladis Cough, MD;  Location: [facility];  Service: General;  Laterality: N/A;   TONSILLECTOMY AND ADENOIDECTOMY      Family History  Problem Relation Age of Onset   Hypertension Mother    Scoliosis Mother    Hyperlipidemia Father    Diabetes Maternal Grandmother    Breast cancer Maternal Grandmother    Heart disease Paternal Grandmother    Hypertension Paternal Grandmother    Healthy Daughter    Healthy Son    Stroke Other    Colon cancer Neg Hx     Social History   Socioeconomic History   Marital status: Married    Spouse name: Not on file   Number of children: 2   Years of education: Not on file   Highest  education level: Associate degree: occupational, Scientist, product/process development, or vocational program  Occupational History   Occupation: Personnel officer fro [employer]  Tobacco Use   Smoking status: Never    Passive exposure: Never   Smokeless tobacco: Never  Vaping Use   Vaping status: Never Used  Substance and Sexual Activity   Alcohol use: No    Alcohol/week: 0.0 standard drinks of alcohol   Drug use: No   Sexual activity: Yes    Partners: Male    Birth control/protection: None  Other Topics Concern   Not on file  Social History Narrative   G4P2  (2 miscarriages)    Household-- pt and 2 children  (2006, 2012)   Social Drivers of Corporate investment banker Strain: Low Risk  (05/11/2023)   Overall Financial Resource Strain (CARDIA)    Difficulty of Paying Living Expenses: Not very hard  Food Insecurity: No Food Insecurity (05/11/2023)   Hunger Vital Sign    Worried About Running Out of Food in the Last Year: Never true    Ran Out of Food in the Last Year: Never true  Transportation Needs: No Transportation Needs (05/11/2023)   PRAPARE - Administrator, Civil Service (Medical): No    Lack of Transportation (  Non-Medical): No  Physical Activity: Inactive (05/11/2023)   Exercise Vital Sign    Days of Exercise per Week: 0 days    Minutes of Exercise per Session: 0 min  Stress: Patient Declined (05/11/2023)   [employer] of Occupational Health - Occupational Stress Questionnaire    Feeling of Stress : Patient declined  Social Connections: Socially Integrated (05/11/2023)   Social Connection and Isolation Panel    Frequency of Communication with Friends and Family: More than three times a week    Frequency of Social Gatherings with Friends and Family: More than three times a week    Attends Religious Services: More than 4 times per year    Active Member of [employer] or Organizations: Yes    Attends Banker Meetings: More than 4 times per year    Marital Status: Married   Catering manager Violence: Not At Risk (03/25/2023)   Humiliation, Afraid, Rape, and Kick questionnaire    Fear of Current or Ex-Partner: No    Emotionally Abused: No    Physically Abused: No    Sexually Abused: No     Physical Exam   Vitals:   10/07/23 2033 10/08/23 0041  BP: 131/85 (!) 142/73  Pulse: 98 76  Resp: 20 18  Temp: 98.4 F (36.9 C) 98.3 F (36.8 C)  SpO2: 100% 99%    CONSTITUTIONAL: Well-appearing, NAD NEURO/PSYCH:  Alert and oriented x 3, no focal deficits EYES:  eyes equal and reactive ENT/NECK:  no LAD, no JVD CARDIO: Regular rate, well-perfused, normal S1 and S2 PULM:  CTAB no wheezing or rhonchi GI/GU:  non-distended, non-tender MSK/SPINE:  No gross deformities, no edema SKIN:  no rash, atraumatic   *Additional and/or pertinent findings included in MDM below  Diagnostic and Interventional Summary    EKG Interpretation Date/Time:  October 07, 2023 at 20:36:41 Ventricular Rate:  98  PR Interval:  157  QRS Duration:   73 QT Interval:   322 QTC Calculation:  412 R Axis:      Text Interpretation: Sinus rhythm, S1Q3T3 compared to prior       Labs Reviewed  BASIC METABOLIC PANEL WITH GFR - Abnormal; Notable for the following components:      Result Value   Potassium 3.3 (*)    CO2 20 (*)    All other components within normal limits  CBC WITH DIFFERENTIAL/PLATELET - Abnormal; Notable for the following components:   Hemoglobin 9.2 (*)    HCT 30.9 (*)    MCV 71.9 (*)    MCH 21.4 (*)    MCHC 29.8 (*)    RDW 18.2 (*)    All other components within normal limits  D-DIMER, QUANTITATIVE - Abnormal; Notable for the following components:   D-Dimer, Quant 0.62 (*)    All other components within normal limits  PREGNANCY, URINE  TROPONIN T, HIGH SENSITIVITY  TROPONIN T, HIGH SENSITIVITY    CT Angio Chest Pulmonary Embolism (PE) W or WO Contrast  Final Result    DG Chest 2 View  Final Result      Medications  iohexol  (OMNIPAQUE ) 350 MG/ML  injection 100 mL (100 mLs Intravenous Contrast Given 10/08/23 0025)     Procedures  /  Critical Care Procedures  ED Course and Medical Decision Making  Initial Impression and Ddx Pleuritic chest pain, DDx includes MSK pain, pneumothorax, PE.  Well-appearing in no acute distress, reassuring vital signs.  Definitely worse with movement and so MSK is favored.  Some EKG changes  as noted above, obtaining D-dimer.  Past medical/surgical history that increases complexity of ED encounter: Hypertension, obesity, asthma  Interpretation of Diagnostics I personally reviewed the EKG and my interpretation is as follows: Sinus rhythm with S1Q3T3  No significant blood count or electrolyte disturbance.  Troponin negative  Patient Reassessment and Ultimate Disposition/Management     D-dimer is positive, CT PE study is negative.  It appears that through the years patient has had a persistently elevated D-dimer for similar presentations and the PE studies have been negative.  Suspect that in the future a D-dimer is not a sensitive or specific test for this patient.  Either way no emergent process, patient is well.  On reassessment, appropriate for discharge.  Patient management required discussion with the following services or consulting groups:  None  Complexity of Problems Addressed Acute illness or injury that poses threat of life of bodily function  Additional Data Reviewed and Analyzed Further history obtained from: Further history from spouse/family member  Additional Factors Impacting ED Encounter Risk Prescriptions and Consideration of hospitalization  Ozell HERO. Theadore, MD [facility] Emergency Medicine [facility] [email or web address]  Final Clinical Impressions(s) / ED Diagnoses     ICD-10-CM   1. Chest pain, unspecified type  R07.9       ED Discharge Orders          Ordered    naproxen  (NAPROSYN ) 500 MG tablet  2 times daily        10/08/23 0058              Discharge Instructions Discussed with and Provided to Patient:     Discharge Instructions      You were evaluated in the Emergency Department and after careful evaluation, we did not find any emergent condition requiring admission or further testing in the hospital.  Your exam/testing today is overall reassuring.  Suspect muscle strain or spasm causing your pain.  Can use Tylenol  at home for pain.  Can also use the Naprosyn  anti-inflammatory twice daily as needed.  Please return to the Emergency Department if you experience any worsening of your condition.   Thank you for allowing us  to be a part of your care.       Theadore Ozell HERO, MD 10/08/23 [phone]

## 2023-10-07 NOTE — ED Triage Notes (Addendum)
 Pt reports RT sided CP x 1 hr; sts pain increases with movement and deep breath; denies other sxs, but sts she couldn't eat her food when she went out to dinner

## 2023-10-08 ENCOUNTER — Emergency Department (HOSPITAL_BASED_OUTPATIENT_CLINIC_OR_DEPARTMENT_OTHER)

## 2023-10-08 DIAGNOSIS — R079 Chest pain, unspecified: Secondary | ICD-10-CM | POA: Diagnosis not present

## 2023-10-08 DIAGNOSIS — E049 Nontoxic goiter, unspecified: Secondary | ICD-10-CM | POA: Diagnosis not present

## 2023-10-08 LAB — TROPONIN T, HIGH SENSITIVITY: Troponin T High Sensitivity: <15 ng/L (ref <19)

## 2023-10-08 LAB — D-DIMER, QUANTITATIVE: D-Dimer, Quant: 0.62 ug{FEU}/mL — ABNORMAL HIGH (ref 0.00–0.50)

## 2023-10-08 MED ORDER — NAPROXEN 500 MG PO TABS: 500.0000 mg | ORAL_TABLET | Freq: Two times a day (BID) | ORAL | 0 refills | Status: AC

## 2023-10-08 MED ORDER — IOHEXOL 350 MG/ML SOLN
100.0000 mL | Freq: Once | INTRAVENOUS | Status: AC | PRN
  Administered 2023-10-08: 100 mL via INTRAVENOUS

## 2023-10-08 NOTE — Discharge Instructions (Addendum)
 You were evaluated in the Emergency Department and after careful evaluation, we did not find any emergent condition requiring admission or further testing in the hospital.  Your exam/testing today is overall reassuring.  Suspect muscle strain or spasm causing your pain.  Can use Tylenol  at home for pain.  Can also use the Naprosyn  anti-inflammatory twice daily as needed.  Please return to the Emergency Department if you experience any worsening of your condition.   Thank you for allowing us  to be a part of your care.

## 2023-10-23 ENCOUNTER — Encounter: Payer: Self-pay | Admitting: Family Medicine

## 2023-10-23 ENCOUNTER — Ambulatory Visit (INDEPENDENT_AMBULATORY_CARE_PROVIDER_SITE_OTHER): Admitting: Family Medicine

## 2023-10-23 VITALS — BP 149/80 | HR 89 | Ht 65.0 in | Wt 322.0 lb

## 2023-10-23 DIAGNOSIS — F329 Major depressive disorder, single episode, unspecified: Secondary | ICD-10-CM

## 2023-10-23 DIAGNOSIS — Z0289 Encounter for other administrative examinations: Secondary | ICD-10-CM

## 2023-10-23 DIAGNOSIS — Z7689 Persons encountering health services in other specified circumstances: Secondary | ICD-10-CM

## 2023-10-23 DIAGNOSIS — E669 Obesity, unspecified: Secondary | ICD-10-CM

## 2023-10-23 DIAGNOSIS — Z68.41 Body mass index (BMI) pediatric, 85th percentile to less than 95th percentile for age: Secondary | ICD-10-CM

## 2023-10-23 DIAGNOSIS — Z111 Encounter for screening for respiratory tuberculosis: Secondary | ICD-10-CM | POA: Diagnosis not present

## 2023-10-23 NOTE — Progress Notes (Addendum)
 Established Patient Office Visit  Subjective    Patient ID: Monica Cantu, female    DOB: 04-22-1985  Age: 38 y.o. MRN: [id number]  CC: No chief complaint on file.   HPI Zaryah L Kemnitz presents for routine weight management. She reports that she is finding it difficult to follow er dietary plan 2/2 stress.   Outpatient Encounter Medications as of 10/23/2023  Medication Sig   albuterol  (VENTOLIN  HFA) 108 (90 Base) MCG/ACT inhaler Inhale 1-2 puffs into the lungs every 4 (four) hours as needed for wheezing or shortness of breath.   cetirizine  (ZYRTEC ) 10 MG tablet Take 1 tablet (10 mg total) by mouth daily.   clonazePAM  (KLONOPIN ) 0.5 MG tablet Take 1 tablet (0.5 mg total) by mouth 2 (two) times daily as needed for anxiety.   colchicine  0.6 MG tablet TAKE 1 TABLET BY MOUTH EVERY DAY   DULoxetine  (CYMBALTA ) 20 MG capsule Take 1 capsule (20 mg total) by mouth daily.   EPINEPHrine 0.3 mg/0.3 mL IJ SOAJ injection See admin instructions.   FLOVENT  HFA 110 MCG/ACT inhaler Inhale 1 puff into the lungs as needed.   fluticasone  (FLONASE ) 50 MCG/ACT nasal spray Place 2 sprays into both nostrils daily.   levocetirizine (XYZAL ) 5 MG tablet Take 1 tablet (5 mg total) by mouth every evening.   naproxen  (NAPROSYN ) 500 MG tablet Take 1 tablet (500 mg total) by mouth 2 (two) times daily.   nitrofurantoin , macrocrystal-monohydrate, (MACROBID ) 100 MG capsule Take 1 capsule (100 mg total) by mouth 2 (two) times daily.   ondansetron  (ZOFRAN ) 4 MG tablet Take 1 tablet (4 mg total) by mouth every 8 (eight) hours as needed for nausea or vomiting.   pantoprazole  (PROTONIX ) 40 MG tablet Take 1 tablet (40 mg total) by mouth 2 (two) times daily.   phentermine  (ADIPEX-P ) 37.5 MG tablet Take 1 tablet (37.5 mg total) by mouth daily before breakfast.   Semaglutide -Weight Management (WEGOVY ) 0.5 MG/0.5ML SOAJ Inject 0.5 mg into the skin once a week.   Vitamin D, Ergocalciferol, (DRISDOL) 1.25 MG (50000 UNIT) CAPS  capsule Take 50,000 Units by mouth once a week. (Patient not taking: Reported on 10/23/2023)   No facility-administered encounter medications on file as of 10/23/2023.    Past Medical History:  Diagnosis Date   Allergy    Asthma    Chest pain    Hypertension    Multiple thyroid  nodules    per patient    PONV (postoperative nausea and vomiting)    with D+ C and tonsillectomy    Pre-diabetes     Past Surgical History:  Procedure Laterality Date   DILATION AND CURETTAGE OF UTERUS     LAPAROSCOPIC GASTRIC SLEEVE RESECTION N/A 03/02/2017   Procedure: LAPAROSCOPIC GASTRIC SLEEVE RESECTION, UPPER ENDO;  Surgeon: Gladis Cough, MD;  Location: [facility];  Service: General;  Laterality: N/A;   TONSILLECTOMY AND ADENOIDECTOMY      Family History  Problem Relation Age of Onset   Hypertension Mother    Scoliosis Mother    Hyperlipidemia Father    Diabetes Maternal Grandmother    Breast cancer Maternal Grandmother    Heart disease Paternal Grandmother    Hypertension Paternal Grandmother    Healthy Daughter    Healthy Son    Stroke Other    Colon cancer Neg Hx     Social History   Socioeconomic History   Marital status: Married    Spouse name: Not on file   Number of children: 2  Years of education: Not on file   Highest education level: Associate degree: occupational, Scientist, product/process development, or vocational program  Occupational History   Occupation: Personnel officer fro [employer]  Tobacco Use   Smoking status: Never    Passive exposure: Never   Smokeless tobacco: Never  Vaping Use   Vaping status: Never Used  Substance and Sexual Activity   Alcohol use: No    Alcohol/week: 0.0 standard drinks of alcohol   Drug use: No   Sexual activity: Yes    Partners: Male    Birth control/protection: None  Other Topics Concern   Not on file  Social History Narrative   G4P2  (2 miscarriages)    Household-- pt and 2 children  (2006, 2012)   Social Drivers of Corporate investment banker  Strain: Low Risk  (05/11/2023)   Overall Financial Resource Strain (CARDIA)    Difficulty of Paying Living Expenses: Not very hard  Food Insecurity: No Food Insecurity (05/11/2023)   Hunger Vital Sign    Worried About Running Out of Food in the Last Year: Never true    Ran Out of Food in the Last Year: Never true  Transportation Needs: No Transportation Needs (05/11/2023)   PRAPARE - Administrator, Civil Service (Medical): No    Lack of Transportation (Non-Medical): No  Physical Activity: Inactive (05/11/2023)   Exercise Vital Sign    Days of Exercise per Week: 0 days    Minutes of Exercise per Session: 0 min  Stress: Patient Declined (05/11/2023)   [employer] of Occupational Health - Occupational Stress Questionnaire    Feeling of Stress : Patient declined  Social Connections: Socially Integrated (05/11/2023)   Social Connection and Isolation Panel    Frequency of Communication with Friends and Family: More than three times a week    Frequency of Social Gatherings with Friends and Family: More than three times a week    Attends Religious Services: More than 4 times per year    Active Member of [employer] or Organizations: Yes    Attends Banker Meetings: More than 4 times per year    Marital Status: Married  Catering manager Violence: Not At Risk (03/25/2023)   Humiliation, Afraid, Rape, and Kick questionnaire    Fear of Current or Ex-Partner: No    Emotionally Abused: No    Physically Abused: No    Sexually Abused: No    Review of Systems  Psychiatric/Behavioral:  Positive for depression. Negative for suicidal ideas. The patient is nervous/anxious.   All other systems reviewed and are negative.       Objective    BP (!) 149/80   Pulse 89   Ht 5' 5 (1.651 m)   Wt (!) 322 lb (146.1 kg)   SpO2 98%   BMI 53.58 kg/m   Physical Exam Vitals and nursing note reviewed.  Constitutional:      General: She is not in acute distress.    Appearance: She is  obese.  Cardiovascular:     Rate and Rhythm: Normal rate and regular rhythm.  Pulmonary:     Effort: Pulmonary effort is normal.     Breath sounds: Normal breath sounds.  Abdominal:     Palpations: Abdomen is soft.     Tenderness: There is no abdominal tenderness.  Neurological:     General: No focal deficit present.     Mental Status: She is alert and oriented to person, place, and time.  Assessment & Plan:   1. Encounter for weight management (Primary) Patient to continue with wegovy .   2. Screening-pulmonary TB  - QuantiFERON-TB Gold Plus  3. Reactive depression Improving. Continue   4. Encounter for completion of form with patient Paperwork for employer completed  Return in about 4 weeks (around 11/20/2023) for follow up, weight management.   Tanda Raguel SQUIBB, MD

## 2023-10-28 ENCOUNTER — Ambulatory Visit: Payer: Self-pay | Admitting: Family Medicine

## 2023-10-28 LAB — QUANTIFERON-TB GOLD PLUS
QuantiFERON Mitogen Value: >10 [IU]/mL
QuantiFERON Nil Value: 0.17 [IU]/mL
QuantiFERON TB1 Ag Value: 0.12 [IU]/mL
QuantiFERON TB2 Ag Value: 0.16 [IU]/mL
QuantiFERON-TB Gold Plus: NEGATIVE

## 2023-11-02 ENCOUNTER — Other Ambulatory Visit: Payer: Self-pay | Admitting: Family Medicine

## 2023-11-13 ENCOUNTER — Other Ambulatory Visit: Payer: Self-pay

## 2023-11-13 ENCOUNTER — Telehealth: Payer: Self-pay

## 2023-11-13 NOTE — Telephone Encounter (Signed)
 Pharmacy Patient Advocate Encounter   Received notification from CoverMyMeds that prior authorization for WEGOVY  is required/requested.   Insurance verification completed.   The patient is insured through [facility] .   Per test claim: PA required; PA submitted to above mentioned insurance via CoverMyMeds Key/confirmation #/EOC [id number] Status is pending

## 2023-11-17 ENCOUNTER — Other Ambulatory Visit: Payer: Self-pay

## 2023-11-18 ENCOUNTER — Other Ambulatory Visit: Payer: Self-pay

## 2023-11-23 ENCOUNTER — Ambulatory Visit: Admitting: Family Medicine

## 2023-11-24 ENCOUNTER — Telehealth: Payer: Self-pay

## 2023-11-24 ENCOUNTER — Other Ambulatory Visit: Payer: Self-pay

## 2023-11-24 NOTE — Telephone Encounter (Signed)
 Pharmacy rep came in today for lunch and let us  know this patient's PA for Wegovy  was denied due to office notes not being attached to request. Could you please upload the OV notes to cover my meds? The Key Code they gave me was [facility]

## 2023-11-25 ENCOUNTER — Other Ambulatory Visit: Payer: Self-pay

## 2023-11-25 NOTE — Telephone Encounter (Signed)
 Pharmacy Patient Advocate Encounter   Received notification from Patient Pharmacy that prior authorization for WEGOVY  is required/requested.   Insurance verification completed.   The patient is insured through [employer] .   Per test claim: PA required; PA submitted to above mentioned insurance via CoverMyMeds Key/confirmation #/EOC [id number] Status is pending  **SECOND REQUEST SUBMITTED WITH PRIMARY INSURANCE PLAN EXCLUSION REJECTION LETTER FROM PRIMARY INSURANCE AND OFFICE NOTES

## 2023-11-26 ENCOUNTER — Other Ambulatory Visit: Payer: Self-pay

## 2023-11-26 NOTE — Telephone Encounter (Signed)
 Pharmacy Patient Advocate Encounter  Received notification from [facility] that Prior Authorization for WEGOVY  has been APPROVED from 11/25/2023 to 05/27/2024

## 2023-12-03 ENCOUNTER — Telehealth (INDEPENDENT_AMBULATORY_CARE_PROVIDER_SITE_OTHER): Admitting: Family Medicine

## 2023-12-03 DIAGNOSIS — F329 Major depressive disorder, single episode, unspecified: Secondary | ICD-10-CM | POA: Diagnosis not present

## 2023-12-03 DIAGNOSIS — L819 Disorder of pigmentation, unspecified: Secondary | ICD-10-CM | POA: Diagnosis not present

## 2023-12-03 DIAGNOSIS — M797 Fibromyalgia: Secondary | ICD-10-CM

## 2023-12-03 NOTE — Progress Notes (Unsigned)
 Virtual Visit via Video Note  I connected with Monica Cantu on 12/03/23 at 10:40 AM EDT by a video enabled telemedicine application and verified that I am speaking with the correct person using two identifiers.  Location: Patient: [name] Provider: [place]   I discussed the limitations of evaluation and management by telemedicine and the availability of in person appointments. The patient expressed understanding and agreed to proceed.  History of Present Illness: Patient reports that she has developed loss of pigment in area where she had a recent steroid injection in her foot. Patient also reports that she continues to have good and bad days but would like to consider getting off of cymbalta  as she feels it makes her feet feel weird.    Observations/Objective: Was able to see area of hypopigmentation via vide0  Assessment and Plan:  1. Hypopigmentation (Primary) Patient defers further eval/mgt at this time 2/2 no other sx.   2. Reactive depression Patient will continue Cymbalta  at this time as it was prescribed for fibromyalgia as well.  3. Fibromyalgia As above  Follow Up Instructions:    I discussed the assessment and treatment plan with the patient. The patient was provided an opportunity to ask questions and all were answered. The patient agreed with the plan and demonstrated an understanding of the instructions.   The patient was advised to call back or seek an in-person evaluation if the symptoms worsen or if the condition fails to improve as anticipated.  I provided 15 minutes of non-face-to-face time during this encounter.   Tanda Raguel SQUIBB, MD

## 2023-12-04 ENCOUNTER — Encounter: Payer: Self-pay | Admitting: Family Medicine

## 2023-12-24 ENCOUNTER — Other Ambulatory Visit: Payer: Self-pay | Admitting: Family Medicine

## 2023-12-30 ENCOUNTER — Telehealth (INDEPENDENT_AMBULATORY_CARE_PROVIDER_SITE_OTHER): Admitting: Family Medicine

## 2023-12-30 DIAGNOSIS — R300 Dysuria: Secondary | ICD-10-CM

## 2023-12-30 MED ORDER — DULOXETINE HCL 30 MG PO CPEP: 30.0000 mg | ORAL_CAPSULE | Freq: Every day | ORAL | 0 refills | Status: AC

## 2023-12-30 MED ORDER — NITROFURANTOIN MONOHYD MACRO 100 MG PO CAPS: 100.0000 mg | ORAL_CAPSULE | Freq: Two times a day (BID) | ORAL | 0 refills | Status: AC

## 2023-12-30 MED ORDER — FLUCONAZOLE 150 MG PO TABS: 150.0000 mg | ORAL_TABLET | Freq: Once | ORAL | 0 refills | Status: AC

## 2023-12-30 NOTE — Progress Notes (Unsigned)
 Virtual Visit via Video Note  I connected with Maricela LITTIE Drones on 12/30/23 at  9:20 AM EDT by a video enabled telemedicine application and verified that I am speaking with the correct person using two identifiers.  Location: Patient: Monica Cantu Provider: [place]   I discussed the limitations of evaluation and management by telemedicine and the availability of in person appointments. The patient expressed understanding and agreed to proceed.  History of Present Illness: Patient reports that she has had burning with urination. She reports that she has been having recurrent UTI since she had her bariatric surgery. Symptoms for about 1 week and worsening.    Observations/Objective:   Assessment and Plan: 1. Dysuria (Primary) Macrobid  prescribed.     Follow Up Instructions:    I discussed the assessment and treatment plan with the patient. The patient was provided an opportunity to ask questions and all were answered. The patient agreed with the plan and demonstrated an understanding of the instructions.   The patient was advised to call back or seek an in-person evaluation if the symptoms worsen or if the condition fails to improve as anticipated.  I provided 9 minutes of non-face-to-face time during this encounter.   Tanda Raguel SQUIBB, MD

## 2023-12-30 NOTE — Progress Notes (Deleted)
 Established Patient Office Visit  Subjective    Patient ID: Monica Cantu, female    DOB: 11/08/1985  Age: 38 y.o. MRN: [id number]  CC: No chief complaint on file.   HPI Monica Cantu presents ***  Outpatient Encounter Medications as of 12/30/2023  Medication Sig   albuterol  (VENTOLIN  HFA) 108 (90 Base) MCG/ACT inhaler Inhale 1-2 puffs into the lungs every 4 (four) hours as needed for wheezing or shortness of breath.   cetirizine  (ZYRTEC ) 10 MG tablet Take 1 tablet (10 mg total) by mouth daily.   clonazePAM  (KLONOPIN ) 0.5 MG tablet Take 1 tablet (0.5 mg total) by mouth 2 (two) times daily as needed for anxiety.   colchicine  0.6 MG tablet TAKE 1 TABLET BY MOUTH EVERY DAY   DULoxetine  (CYMBALTA ) 20 MG capsule Take 1 capsule (20 mg total) by mouth daily.   DULoxetine  (CYMBALTA ) 60 MG capsule TAKE 1 CAPSULE BY MOUTH EVERY DAY   EPINEPHrine 0.3 mg/0.3 mL IJ SOAJ injection See admin instructions.   FLOVENT  HFA 110 MCG/ACT inhaler Inhale 1 puff into the lungs as needed.   fluticasone  (FLONASE ) 50 MCG/ACT nasal spray Place 2 sprays into both nostrils daily.   levocetirizine (XYZAL ) 5 MG tablet Take 1 tablet (5 mg total) by mouth every evening.   naproxen  (NAPROSYN ) 500 MG tablet Take 1 tablet (500 mg total) by mouth 2 (two) times daily.   nitrofurantoin , macrocrystal-monohydrate, (MACROBID ) 100 MG capsule Take 1 capsule (100 mg total) by mouth 2 (two) times daily.   ondansetron  (ZOFRAN ) 4 MG tablet Take 1 tablet (4 mg total) by mouth every 8 (eight) hours as needed for nausea or vomiting.   pantoprazole  (PROTONIX ) 40 MG tablet Take 1 tablet (40 mg total) by mouth 2 (two) times daily.   phentermine  (ADIPEX-P ) 37.5 MG tablet Take 1 tablet (37.5 mg total) by mouth daily before breakfast.   Vitamin D, Ergocalciferol, (DRISDOL) 1.25 MG (50000 UNIT) CAPS capsule Take 50,000 Units by mouth once a week. (Patient not taking: Reported on 10/23/2023)   WEGOVY  0.5 MG/0.5ML SOAJ SQ injection INJECT  0.5 ML (ONE PEN) UNDER THE SKIN EVERY WEEK   No facility-administered encounter medications on file as of 12/30/2023.    Past Medical History:  Diagnosis Date   Allergy    Asthma    Chest pain    Hypertension    Multiple thyroid  nodules    per patient    PONV (postoperative nausea and vomiting)    with D+ C and tonsillectomy    Pre-diabetes     Past Surgical History:  Procedure Laterality Date   DILATION AND CURETTAGE OF UTERUS     LAPAROSCOPIC GASTRIC SLEEVE RESECTION N/A 03/02/2017   Procedure: LAPAROSCOPIC GASTRIC SLEEVE RESECTION, UPPER ENDO;  Surgeon: Gladis Cough, MD;  Location: [facility];  Service: General;  Laterality: N/A;   TONSILLECTOMY AND ADENOIDECTOMY      Family History  Problem Relation Age of Onset   Hypertension Mother    Scoliosis Mother    Hyperlipidemia Father    Diabetes Maternal Grandmother    Breast cancer Maternal Grandmother    Heart disease Paternal Grandmother    Hypertension Paternal Grandmother    Healthy Daughter    Healthy Son    Stroke Other    Colon cancer Neg Hx     Social History   Socioeconomic History   Marital status: Married    Spouse name: Not on file   Number of children: 2   Years of education:  Not on file   Highest education level: Associate degree: occupational, Scientist, product/process development, or vocational program  Occupational History   Occupation: Personnel officer fro [employer]  Tobacco Use   Smoking status: Never    Passive exposure: Never   Smokeless tobacco: Never  Vaping Use   Vaping status: Never Used  Substance and Sexual Activity   Alcohol use: No    Alcohol/week: 0.0 standard drinks of alcohol   Drug use: No   Sexual activity: Yes    Partners: Male    Birth control/protection: None  Other Topics Concern   Not on file  Social History Narrative   G4P2  (2 miscarriages)    Household-- pt and 2 children  (2006, 2012)   Social Drivers of Corporate investment banker Strain: Low Risk  (05/11/2023)   Overall  Financial Resource Strain (CARDIA)    Difficulty of Paying Living Expenses: Not very hard  Food Insecurity: No Food Insecurity (05/11/2023)   Hunger Vital Sign    Worried About Running Out of Food in the Last Year: Never true    Ran Out of Food in the Last Year: Never true  Transportation Needs: No Transportation Needs (05/11/2023)   PRAPARE - Administrator, Civil Service (Medical): No    Lack of Transportation (Non-Medical): No  Physical Activity: Inactive (05/11/2023)   Exercise Vital Sign    Days of Exercise per Week: 0 days    Minutes of Exercise per Session: 0 min  Stress: Patient Declined (05/11/2023)   [employer] of Occupational Health - Occupational Stress Questionnaire    Feeling of Stress : Patient declined  Social Connections: Socially Integrated (05/11/2023)   Social Connection and Isolation Panel    Frequency of Communication with Friends and Family: More than three times a week    Frequency of Social Gatherings with Friends and Family: More than three times a week    Attends Religious Services: More than 4 times per year    Active Member of [employer] or Organizations: Yes    Attends Engineer, structural: More than 4 times per year    Marital Status: Married  Catering manager Violence: Not At Risk (03/25/2023)   Humiliation, Afraid, Rape, and Kick questionnaire    Fear of Current or Ex-Partner: No    Emotionally Abused: No    Physically Abused: No    Sexually Abused: No    ROS      Objective    There were no vitals taken for this visit.  Physical Exam  {Labs (Optional):23779}    Assessment & Plan:   There are no diagnoses linked to this encounter.   No follow-ups on file.   Tanda Raguel SQUIBB, MD

## 2023-12-31 ENCOUNTER — Encounter: Payer: Self-pay | Admitting: Family Medicine

## 2024-01-04 ENCOUNTER — Emergency Department (HOSPITAL_BASED_OUTPATIENT_CLINIC_OR_DEPARTMENT_OTHER)

## 2024-01-04 ENCOUNTER — Emergency Department (HOSPITAL_BASED_OUTPATIENT_CLINIC_OR_DEPARTMENT_OTHER): Admission: EM | Admit: 2024-01-04 | Discharge: 2024-01-04 | Disposition: A | Discharge status: Home / Self Care

## 2024-01-04 ENCOUNTER — Encounter (HOSPITAL_BASED_OUTPATIENT_CLINIC_OR_DEPARTMENT_OTHER): Payer: Self-pay | Admitting: Urology

## 2024-01-04 DIAGNOSIS — R109 Unspecified abdominal pain: Secondary | ICD-10-CM | POA: Diagnosis present

## 2024-01-04 DIAGNOSIS — I10 Essential (primary) hypertension: Secondary | ICD-10-CM | POA: Insufficient documentation

## 2024-01-04 DIAGNOSIS — Z7951 Long term (current) use of inhaled steroids: Secondary | ICD-10-CM | POA: Insufficient documentation

## 2024-01-04 DIAGNOSIS — E119 Type 2 diabetes mellitus without complications: Secondary | ICD-10-CM | POA: Diagnosis not present

## 2024-01-04 DIAGNOSIS — J45909 Unspecified asthma, uncomplicated: Secondary | ICD-10-CM | POA: Diagnosis not present

## 2024-01-04 LAB — URINALYSIS, ROUTINE W REFLEX MICROSCOPIC
Bilirubin Urine: NEGATIVE
Glucose, UA: NEGATIVE mg/dL
Hgb urine dipstick: NEGATIVE
Ketones, ur: NEGATIVE mg/dL
Leukocytes,Ua: NEGATIVE
Nitrite: NEGATIVE
Protein, ur: NEGATIVE mg/dL
Specific Gravity, Urine: 1.027 (ref 1.005–1.030)
pH: 5.5 (ref 5.0–8.0)

## 2024-01-04 LAB — BASIC METABOLIC PANEL WITH GFR
Anion gap: 11 (ref 5–15)
BUN: 9 mg/dL (ref 6–20)
CO2: 22 mmol/L (ref 22–32)
Calcium: 9.2 mg/dL (ref 8.9–10.3)
Chloride: 105 mmol/L (ref 98–111)
Creatinine, Ser: 0.82 mg/dL (ref 0.44–1.00)
GFR, Estimated: >60 mL/min (ref >60)
Glucose, Bld: 101 mg/dL — ABNORMAL HIGH (ref 70–99)
Potassium: 3.6 mmol/L (ref 3.5–5.1)
Sodium: 139 mmol/L (ref 135–145)

## 2024-01-04 LAB — CBC
HCT: 34.3 % — ABNORMAL LOW (ref 36.0–46.0)
Hemoglobin: 10 g/dL — ABNORMAL LOW (ref 12.0–15.0)
MCH: 20.9 pg — ABNORMAL LOW (ref 26.0–34.0)
MCHC: 29.2 g/dL — ABNORMAL LOW (ref 30.0–36.0)
MCV: 71.8 fL — ABNORMAL LOW (ref 80.0–100.0)
Platelets: 337 K/uL (ref 150–400)
RBC: 4.78 MIL/uL (ref 3.87–5.11)
RDW: 18.7 % — ABNORMAL HIGH (ref 11.5–15.5)
WBC: 7.4 K/uL (ref 4.0–10.5)
nRBC: 0 % (ref 0.0–0.2)

## 2024-01-04 LAB — PREGNANCY, URINE: Preg Test, Ur: NEGATIVE

## 2024-01-04 MED ORDER — LIDOCAINE 5 % EX PTCH: 1.0000 | MEDICATED_PATCH | CUTANEOUS | 0 refills | Status: AC

## 2024-01-04 MED ORDER — METHOCARBAMOL 500 MG PO TABS
500.0000 mg | ORAL_TABLET | Freq: Once | ORAL | Status: AC
  Administered 2024-01-04: 500 mg via ORAL
  Filled 2024-01-04: qty 1

## 2024-01-04 MED ORDER — METHOCARBAMOL 500 MG PO TABS: 500.0000 mg | ORAL_TABLET | Freq: Two times a day (BID) | ORAL | 0 refills | Status: AC

## 2024-01-04 MED ORDER — LIDOCAINE 5 % EX PTCH
1.0000 | MEDICATED_PATCH | Freq: Once | CUTANEOUS | Status: DC
Start: 2024-01-04 — End: 2024-01-05
  Administered 2024-01-04: 1 via TRANSDERMAL
  Filled 2024-01-04: qty 1

## 2024-01-04 NOTE — ED Notes (Signed)
 RN reviewed discharge instructions with pt. Pt verbalized understanding and had no further questions

## 2024-01-04 NOTE — Discharge Instructions (Signed)
 Please follow-up with your primary doctor.  We are prescribing you pain medication that you can take as needed.  Return if felt fevers, chills, severe pain, inability eat or drink due to nausea vomiting, stop having bowel movements or any new or worsening symptoms that are concerning to you.

## 2024-01-04 NOTE — ED Provider Notes (Signed)
 [place] EMERGENCY DEPARTMENT AT [facility] Provider Note   CSN: [id number] Arrival date & time: 01/04/24  1547     Patient presents with: Flank Pain   Monica Cantu is a 38 y.o. female.   This is a 38 year old female presenting emergency department with right sided flank/side pain.  Reports symptoms x 3 weeks.  Initially thought it was MSK etiology with PCP and was prescribed Flexeril  did not improve.  Was treated with Macrobid  for presumed UTI last week, but symptoms persisted.  No nausea no vomiting.  No fevers.  No vomiting.  Does have a history of gastric sleeve, but normal bowel movements.   Flank Pain       Prior to Admission medications   Medication Sig Start Date End Date Taking? Authorizing Provider  albuterol  (VENTOLIN  HFA) 108 (90 Base) MCG/ACT inhaler Inhale 1-2 puffs into the lungs every 4 (four) hours as needed for wheezing or shortness of breath. 07/09/23   Tanda Bleacher, MD  cetirizine  (ZYRTEC ) 10 MG tablet Take 1 tablet (10 mg total) by mouth daily. 07/09/23   Tanda Bleacher, MD  clonazePAM  (KLONOPIN ) 0.5 MG tablet Take 1 tablet (0.5 mg total) by mouth 2 (two) times daily as needed for anxiety. 04/15/23   Tanda Bleacher, MD  colchicine  0.6 MG tablet TAKE 1 TABLET BY MOUTH EVERY DAY 08/20/23   Tanda Bleacher, MD  DULoxetine  (CYMBALTA ) 20 MG capsule Take 1 capsule (20 mg total) by mouth daily. 07/09/23   Tanda Bleacher, MD  DULoxetine  (CYMBALTA ) 30 MG capsule Take 1 capsule (30 mg total) by mouth daily. 12/30/23   Tanda Bleacher, MD  DULoxetine  (CYMBALTA ) 60 MG capsule TAKE 1 CAPSULE BY MOUTH EVERY DAY 11/03/23   Tanda Bleacher, MD  EPINEPHrine 0.3 mg/0.3 mL IJ SOAJ injection See admin instructions. 09/07/20   [provider]  FLOVENT  HFA 110 MCG/ACT inhaler Inhale 1 puff into the lungs as needed. 07/09/23   Tanda Bleacher, MD  fluticasone  (FLONASE ) 50 MCG/ACT nasal spray Place 2 sprays into both nostrils daily. 07/09/23   Tanda Bleacher, MD   levocetirizine (XYZAL ) 5 MG tablet Take 1 tablet (5 mg total) by mouth every evening. 07/09/23   Tanda Bleacher, MD  naproxen  (NAPROSYN ) 500 MG tablet Take 1 tablet (500 mg total) by mouth 2 (two) times daily. 10/08/23   Theadore Ozell HERO, MD  nitrofurantoin , macrocrystal-monohydrate, (MACROBID ) 100 MG capsule Take 1 capsule (100 mg total) by mouth 2 (two) times daily. 12/30/23   Tanda Bleacher, MD  ondansetron  (ZOFRAN ) 4 MG tablet Take 1 tablet (4 mg total) by mouth every 8 (eight) hours as needed for nausea or vomiting. 05/19/23   Tanda Bleacher, MD  pantoprazole  (PROTONIX ) 40 MG tablet Take 1 tablet (40 mg total) by mouth 2 (two) times daily. 07/09/23   Tanda Bleacher, MD  phentermine  (ADIPEX-P ) 37.5 MG tablet Take 1 tablet (37.5 mg total) by mouth daily before breakfast. 08/16/21   Tanda Bleacher, MD  Vitamin D, Ergocalciferol, (DRISDOL) 1.25 MG (50000 UNIT) CAPS capsule Take 50,000 Units by mouth once a week. Patient not taking: Reported on 10/23/2023 10/23/20   [provider]  WEGOVY  0.5 MG/0.5ML SOAJ SQ injection INJECT 0.5 ML (ONE PEN) UNDER THE SKIN EVERY WEEK 12/28/23   Tanda Bleacher, MD    Allergies: Cephalexin  and Amoxicillin -pot clavulanate    Review of Systems  Genitourinary:  Positive for flank pain.    Updated Vital Signs BP 130/87   Pulse 77   Temp 98.4 F (36.9 C) (Oral)  Resp 18   Ht 5' 5 (1.651 m)   Wt (!) 146.1 kg   SpO2 100%   BMI 53.60 kg/m   Physical Exam Vitals and nursing note reviewed.  Constitutional:      General: She is not in acute distress.    Appearance: She is obese. She is not toxic-appearing.  HENT:     Head: Normocephalic.     Nose: Nose normal.  Eyes:     Conjunctiva/sclera: Conjunctivae normal.  Cardiovascular:     Rate and Rhythm: Normal rate and regular rhythm.  Pulmonary:     Effort: Pulmonary effort is normal.  Abdominal:     General: Abdomen is flat. There is no distension.     Palpations: Abdomen is soft.     Tenderness:  There is no abdominal tenderness. There is no guarding or rebound.  Musculoskeletal:     Comments: No midline tenderness  Skin:    General: Skin is warm and dry.     Capillary Refill: Capillary refill takes less than 2 seconds.  Neurological:     Mental Status: She is alert and oriented to person, place, and time.  Psychiatric:        Mood and Affect: Mood normal.        Behavior: Behavior normal.     (all labs ordered are listed, but only abnormal results are displayed) Labs Reviewed  BASIC METABOLIC PANEL WITH GFR - Abnormal; Notable for the following components:      Result Value   Glucose, Bld 101 (*)    All other components within normal limits  CBC - Abnormal; Notable for the following components:   Hemoglobin 10.0 (*)    HCT 34.3 (*)    MCV 71.8 (*)    MCH 20.9 (*)    MCHC 29.2 (*)    RDW 18.7 (*)    All other components within normal limits  URINALYSIS, ROUTINE W REFLEX MICROSCOPIC  PREGNANCY, URINE    EKG: None  Radiology: CT Renal Stone Study Result Date: 01/04/2024 CLINICAL DATA:  Abdominal pain.  Concern for kidney stone. EXAM: CT ABDOMEN AND PELVIS WITHOUT CONTRAST TECHNIQUE: Multidetector CT imaging of the abdomen and pelvis was performed following the standard protocol without IV contrast. RADIATION DOSE REDUCTION: This exam was performed according to the departmental dose-optimization program which includes automated exposure control, adjustment of the mA and/or kV according to patient size and/or use of iterative reconstruction technique. COMPARISON:  CT abdomen pelvis dated 01/15/2019. FINDINGS: Evaluation of this exam is limited in the absence of intravenous contrast. Lower chest: The visualized lung bases are clear. No intra-abdominal free air or free fluid. Hepatobiliary: The liver is unremarkable. No biliary dilatation. The gallbladder is unremarkable. Pancreas: Unremarkable. No pancreatic ductal dilatation or surrounding inflammatory changes. Spleen: Normal  in size without focal abnormality. Adrenals/Urinary Tract: The adrenal glands, kidneys, visualized ureters, and urinary bladder are unremarkable. Stomach/Bowel: Postsurgical changes of gastric sleeve. There is no bowel obstruction or active inflammation. The appendix is normal. Vascular/Lymphatic: The abdominal aorta and IVC unremarkable. No portal venous gas. There is no adenopathy. Reproductive: The uterus is anteverted and grossly unremarkable. No suspicious adnexal masses. Other: Small fat containing umbilical hernia. Musculoskeletal: Mild degenerative changes. No acute osseous pathology. IMPRESSION: 1. No acute intra-abdominal or pelvic pathology. No hydronephrosis or nephrolithiasis. 2. Postsurgical changes of gastric sleeve. No bowel obstruction. Normal appendix. Electronically Signed   By: Vanetta Chou M.D.   On: 01/04/2024 19:53     Procedures  Medications Ordered in the ED  lidocaine  (LIDODERM ) 5 % 1 patch (has no administration in time range)  methocarbamol  (ROBAXIN ) tablet 500 mg (has no administration in time range)    Clinical Course as of 01/04/24 2019  [facility] Jan 04, 2024  1957 CT Renal Stone Study IMPRESSION: 1. No acute intra-abdominal or pelvic pathology. No hydronephrosis or nephrolithiasis. 2. Postsurgical changes of gastric sleeve. No bowel obstruction. Normal appendix.   Electronically Signed   By: Vanetta Chou M.D.   On: 01/04/2024 19:5   [TY]    Clinical Course User Index [TY] Neysa Caron PARAS, DO                                 Medical Decision Making This is well-appearing 38 year old female presenting emergency department subacute back pain she is afebrile nontachycardic, slightly hypertensive.  Physical exam reassuring no midline spine tenderness.  Normal sensation and strength in lower extremities.  She was treated UA recently, however her UA today is not indicative of urinary tract infection and therefore pyelonephritis less likely today.  Concern  for possible stone, CT scan ordered and negative.  No metabolic derangements.  Normal kidney function.  Pregnancy test is negative, ectopic pregnancy unlikely.  She has no leukocytosis to suggest systemic infection.  She has baseline anemia.  I do query MSK etiology, given lidocaine  patch and muscle relaxer.  Patient to follow-up with primary doctor.  Amount and/or Complexity of Data Reviewed External Data Reviewed:     Details: History of asthma hypertension obesity diabetes Labs: ordered. Decision-making details documented in ED Course. Radiology: independent interpretation performed. Decision-making details documented in ED Course.    Details: Do not appreciate free air  Risk Prescription drug management. Decision regarding hospitalization. Diagnosis or treatment significantly limited by social determinants of health.       Final diagnoses:  None    ED Discharge Orders     None          Neysa Caron PARAS, DO 01/04/24 2353

## 2024-01-04 NOTE — ED Provider Triage Note (Signed)
 Emergency Medicine Provider Triage Evaluation Note  Monica Cantu , a 38 y.o. female  was evaluated in triage.  Pt complains of R sided flank pain and urinary urgency x 2 weeks. Dx with UTI last week and finished full course of macrobid  without relief of Sx. Told to come to the ED.   LMP 12/28/2023  Denies fever, headache, vision changes, chest pain, shortness of breath, abdominal pain, vomiting, diarrhea, dysuria, hematuria, vaginal discharge, vaginal pain, LE swelling.  Review of Systems  Positive: N/a Negative: N/a  Physical Exam  BP (!) 155/85   Pulse 85   Temp 98.4 F (36.9 C) (Oral)   Resp 16   Ht 5' 5 (1.651 m)   Wt (!) 146.1 kg   SpO2 100%   BMI 53.60 kg/m  Gen:   Awake, no distress   Resp:  Normal effort  MSK:   Moves extremities without difficulty  Other:    Medical Decision Making  Medically screening exam initiated at 4:10 PM.  Appropriate orders placed.  Monica Cantu was informed that the remainder of the evaluation will be completed by another provider, this initial triage assessment does not replace that evaluation, and the importance of remaining in the ED until their evaluation is complete.     Beola Terrall RAMAN, [place] 01/04/24 [phone]

## 2024-01-04 NOTE — ED Triage Notes (Signed)
 Pt states pain in right side and back x 2 weeks Was recently treated for UTI and finished antibiotics  Pain is constant  Heat and ibuprofen  not helping  Denies urinary symptoms at this time

## 2024-02-02 ENCOUNTER — Other Ambulatory Visit: Payer: Self-pay | Admitting: Family Medicine

## 2024-02-17 ENCOUNTER — Encounter: Payer: Self-pay | Admitting: Family Medicine

## 2024-02-17 ENCOUNTER — Telehealth (INDEPENDENT_AMBULATORY_CARE_PROVIDER_SITE_OTHER): Admitting: Family Medicine

## 2024-02-17 DIAGNOSIS — J209 Acute bronchitis, unspecified: Secondary | ICD-10-CM

## 2024-02-17 MED ORDER — AZITHROMYCIN 250 MG PO TABS: ORAL_TABLET | ORAL | 0 refills | Status: AC

## 2024-02-17 NOTE — Progress Notes (Signed)
 Virtual Visit via Video Note  I connected with Monica Cantu on 02/17/24 at 11:00 AM EST by a video enabled telemedicine application and verified that I am speaking with the correct person using two identifiers.  Location: Patient: [place] Provider: [place]   I discussed the limitations of evaluation and management by telemedicine and the availability of in person appointments. The patient expressed understanding and agreed to proceed.  History of Present Illness: patient reports cough and congestion. Cough productive of yellowish sputum. Patient is administrator, sports. Patient denies fever/chills.     Observations/Objective:   Assessment and Plan:  1. Acute bronchitis, unspecified organism (Primary) Zithromax  prescribed  Follow Up Instructions:    I discussed the assessment and treatment plan with the patient. The patient was provided an opportunity to ask questions and all were answered. The patient agreed with the plan and demonstrated an understanding of the instructions.   The patient was advised to call back or seek an in-person evaluation if the symptoms worsen or if the condition fails to improve as anticipated.  I provided 12 minutes of non-face-to-face time during this encounter.   Tanda Raguel SQUIBB, MD

## 2024-02-26 ENCOUNTER — Other Ambulatory Visit: Payer: Self-pay | Admitting: Family Medicine

## 2024-02-27 NOTE — Telephone Encounter (Signed)
 Requested medication (s) are due for refill today: yes  Requested medication (s) are on the active medication list: yes   Last refill:  04/15/23 1320 with 1 refill   Future visit scheduled: yes   Notes to clinic:  Please review for refill. Refill not delegated per protocol.     Requested Prescriptions  Pending Prescriptions Disp Refills   clonazePAM  (KLONOPIN ) 0.5 MG tablet [Pharmacy Med Name: CLONAZEPAM  0.5MG  TABLETS] 20 tablet     Sig: TAKE 1 TABLET(0.5 MG) BY MOUTH TWICE DAILY AS NEEDED FOR ANXIETY     Not Delegated - Psychiatry: Anxiolytics/Hypnotics 2 Failed - 02/27/2024 11:56 AM      Failed - This refill cannot be delegated      Failed - Urine Drug Screen completed in last 360 days      Passed - Patient is not pregnant      Passed - Valid encounter within last 6 months    Recent Outpatient Visits           1 week ago Acute bronchitis, unspecified organism   [facility], MD   1 month ago Dysuria   [facility], MD   2 months ago Hypopigmentation   [facility], MD   4 months ago Encounter for weight management   [facility], MD   7 months ago Right foot pain    [facility], MD

## 2024-04-14 ENCOUNTER — Ambulatory Visit: Payer: Self-pay | Admitting: Family Medicine

## 2024-04-18 ENCOUNTER — Ambulatory Visit (INDEPENDENT_AMBULATORY_CARE_PROVIDER_SITE_OTHER): Admitting: Family Medicine

## 2024-04-18 ENCOUNTER — Encounter: Payer: Self-pay | Admitting: Family Medicine

## 2024-04-18 VITALS — BP 122/85 | HR 80 | Ht 65.0 in | Wt 340.2 lb

## 2024-04-18 DIAGNOSIS — F329 Major depressive disorder, single episode, unspecified: Secondary | ICD-10-CM | POA: Diagnosis not present

## 2024-04-18 DIAGNOSIS — K219 Gastro-esophageal reflux disease without esophagitis: Secondary | ICD-10-CM

## 2024-04-18 DIAGNOSIS — Z9884 Bariatric surgery status: Secondary | ICD-10-CM | POA: Diagnosis not present

## 2024-04-18 DIAGNOSIS — Z6841 Body Mass Index (BMI) 40.0 and over, adult: Secondary | ICD-10-CM | POA: Diagnosis not present

## 2024-04-18 DIAGNOSIS — J4540 Moderate persistent asthma, uncomplicated: Secondary | ICD-10-CM | POA: Diagnosis not present

## 2024-04-18 DIAGNOSIS — E66813 Obesity, class 3: Secondary | ICD-10-CM

## 2024-04-18 MED ORDER — FLUTICASONE PROPIONATE 50 MCG/ACT NA SUSP: 2.0000 | Freq: Every day | NASAL | 2 refills | Status: AC

## 2024-04-18 MED ORDER — PHENTERMINE HCL 37.5 MG PO TABS: 37.5000 mg | ORAL_TABLET | Freq: Every day | ORAL | 0 refills | Status: AC

## 2024-04-18 MED ORDER — PANTOPRAZOLE SODIUM 40 MG PO TBEC: 40.0000 mg | DELAYED_RELEASE_TABLET | Freq: Two times a day (BID) | ORAL | 1 refills | Status: AC

## 2024-04-18 MED ORDER — LEVOCETIRIZINE DIHYDROCHLORIDE 5 MG PO TABS: 5.0000 mg | ORAL_TABLET | Freq: Every evening | ORAL | 1 refills | Status: AC

## 2024-04-18 MED ORDER — IRON (FERROUS SULFATE) 325 (65 FE) MG PO TABS: 325.0000 mg | ORAL_TABLET | Freq: Every day | ORAL | 1 refills | Status: AC

## 2024-04-18 MED ORDER — CLONAZEPAM 0.5 MG PO TABS: 0.5000 mg | ORAL_TABLET | Freq: Two times a day (BID) | ORAL | 0 refills | Status: AC | PRN

## 2024-04-18 NOTE — Progress Notes (Signed)
 "  Established Patient Office Visit  Subjective    Patient ID: Monica Cantu, female    DOB: 09/26/1985  Age: 39 y.o. MRN: [id number]  CC:  Chief Complaint  Patient presents with   Medical Management of Chronic Issues    HPI Hilary L Rexroad presents for follow up of anxiety/depression, weight loss, GERD, and  asthma. Patient reports that she has been med compliant. Has some heightened anxiety/depression as this is the anniversary of her son's death.   Outpatient Encounter Medications as of 04/18/2024  Medication Sig   albuterol  (VENTOLIN  HFA) 108 (90 Base) MCG/ACT inhaler Inhale 1-2 puffs into the lungs every 4 (four) hours as needed for wheezing or shortness of breath.   cetirizine  (ZYRTEC ) 10 MG tablet Take 1 tablet (10 mg total) by mouth daily.   EPINEPHrine 0.3 mg/0.3 mL IJ SOAJ injection See admin instructions.   FLOVENT  HFA 110 MCG/ACT inhaler Inhale 1 puff into the lungs as needed.   Iron , Ferrous Sulfate , 325 (65 Fe) MG TABS Take 325 mg by mouth daily.   methocarbamol  (ROBAXIN ) 500 MG tablet Take 1 tablet (500 mg total) by mouth 2 (two) times daily.   naproxen  (NAPROSYN ) 500 MG tablet Take 1 tablet (500 mg total) by mouth 2 (two) times daily.   ondansetron  (ZOFRAN ) 4 MG tablet Take 1 tablet (4 mg total) by mouth every 8 (eight) hours as needed for nausea or vomiting.   clonazePAM  (KLONOPIN ) 0.5 MG tablet Take 1 tablet (0.5 mg total) by mouth 2 (two) times daily as needed for anxiety.   colchicine  0.6 MG tablet TAKE 1 TABLET BY MOUTH EVERY DAY (Patient not taking: Reported on 04/18/2024)   DULoxetine  (CYMBALTA ) 20 MG capsule Take 1 capsule (20 mg total) by mouth daily.   DULoxetine  (CYMBALTA ) 30 MG capsule Take 1 capsule (30 mg total) by mouth daily.   DULoxetine  (CYMBALTA ) 60 MG capsule TAKE 1 CAPSULE BY MOUTH EVERY DAY   fluticasone  (FLONASE ) 50 MCG/ACT nasal spray Place 2 sprays into both nostrils daily.   levocetirizine (XYZAL ) 5 MG tablet Take 1 tablet (5 mg total) by  mouth every evening.   lidocaine  (LIDODERM ) 5 % Place 1 patch onto the skin daily. Remove & Discard patch within 12 hours or as directed by MD   nitrofurantoin , macrocrystal-monohydrate, (MACROBID ) 100 MG capsule Take 1 capsule (100 mg total) by mouth 2 (two) times daily.   pantoprazole  (PROTONIX ) 40 MG tablet Take 1 tablet (40 mg total) by mouth 2 (two) times daily.   phentermine  (ADIPEX-P ) 37.5 MG tablet Take 1 tablet (37.5 mg total) by mouth daily before breakfast.   Vitamin D, Ergocalciferol, (DRISDOL) 1.25 MG (50000 UNIT) CAPS capsule Take 50,000 Units by mouth once a week. (Patient not taking: Reported on 10/23/2023)   WEGOVY  0.5 MG/0.5ML SOAJ SQ injection INJECT 0.5 ML (ONE PEN) UNDER THE SKIN EVERY WEEK   [DISCONTINUED] clonazePAM  (KLONOPIN ) 0.5 MG tablet TAKE 1 TABLET(0.5 MG) BY MOUTH TWICE DAILY AS NEEDED FOR ANXIETY   [DISCONTINUED] fluticasone  (FLONASE ) 50 MCG/ACT nasal spray Place 2 sprays into both nostrils daily.   [DISCONTINUED] levocetirizine (XYZAL ) 5 MG tablet Take 1 tablet (5 mg total) by mouth every evening.   [DISCONTINUED] pantoprazole  (PROTONIX ) 40 MG tablet Take 1 tablet (40 mg total) by mouth 2 (two) times daily.   [DISCONTINUED] phentermine  (ADIPEX-P ) 37.5 MG tablet Take 1 tablet (37.5 mg total) by mouth daily before breakfast.   No facility-administered encounter medications on file as of 04/18/2024.    Past  Medical History:  Diagnosis Date   Allergy    Asthma    Chest pain    Hypertension    Multiple thyroid  nodules    per patient    PONV (postoperative nausea and vomiting)    with D+ C and tonsillectomy    Pre-diabetes     Past Surgical History:  Procedure Laterality Date   DILATION AND CURETTAGE OF UTERUS     LAPAROSCOPIC GASTRIC SLEEVE RESECTION N/A 03/02/2017   Procedure: LAPAROSCOPIC GASTRIC SLEEVE RESECTION, UPPER ENDO;  Surgeon: Gladis Cough, MD;  Location: [facility];  Service: General;  Laterality: N/A;   TONSILLECTOMY AND ADENOIDECTOMY       Family History  Problem Relation Age of Onset   Hypertension Mother    Scoliosis Mother    Hyperlipidemia Father    Diabetes Maternal Grandmother    Breast cancer Maternal Grandmother    Heart disease Paternal Grandmother    Hypertension Paternal Grandmother    Healthy Daughter    Healthy Son    Stroke Other    Colon cancer Neg Hx     Social History   Socioeconomic History   Marital status: Married    Spouse name: Not on file   Number of children: 2   Years of education: Not on file   Highest education level: Associate degree: occupational, scientist, product/process development, or vocational program  Occupational History   Occupation: personnel officer fro [employer]  Tobacco Use   Smoking status: Never    Passive exposure: Never   Smokeless tobacco: Never  Vaping Use   Vaping status: Never Used  Substance and Sexual Activity   Alcohol use: No    Alcohol/week: 0.0 standard drinks of alcohol   Drug use: No   Sexual activity: Yes    Partners: Male    Birth control/protection: None  Other Topics Concern   Not on file  Social History Narrative   G4P2  (2 miscarriages)    Household-- pt and 2 children  (2006, 2012)   Social Drivers of Health   Tobacco Use: Low Risk (04/18/2024)   Patient History    Smoking Tobacco Use: Never    Smokeless Tobacco Use: Never    Passive Exposure: Never  Financial Resource Strain: Low Risk (05/11/2023)   Overall Financial Resource Strain (CARDIA)    Difficulty of Paying Living Expenses: Not very hard  Food Insecurity: No Food Insecurity (05/11/2023)   Hunger Vital Sign    Worried About Running Out of Food in the Last Year: Never true    Ran Out of Food in the Last Year: Never true  Transportation Needs: No Transportation Needs (05/11/2023)   PRAPARE - Administrator, Civil Service (Medical): No    Lack of Transportation (Non-Medical): No  Physical Activity: Inactive (05/11/2023)   Exercise Vital Sign    Days of Exercise per Week: 0 days     Minutes of Exercise per Session: 0 min  Stress: Patient Declined (05/11/2023)   [employer] of Occupational Health - Occupational Stress Questionnaire    Feeling of Stress : Patient declined  Social Connections: Socially Integrated (05/11/2023)   Social Connection and Isolation Panel    Frequency of Communication with Friends and Family: More than three times a week    Frequency of Social Gatherings with Friends and Family: More than three times a week    Attends Religious Services: More than 4 times per year    Active Member of Clubs or Organizations: Yes    Attends  Club or Organization Meetings: More than 4 times per year    Marital Status: Married  Catering Manager Violence: Not At Risk (03/25/2023)   Humiliation, Afraid, Rape, and Kick questionnaire    Fear of Current or Ex-Partner: No    Emotionally Abused: No    Physically Abused: No    Sexually Abused: No  Depression (PHQ2-9): High Risk (07/23/2023)   Depression (PHQ2-9)    PHQ-2 Score: 11  Alcohol Screen: Low Risk (03/25/2023)   Alcohol Screen    Last Alcohol Screening Score (AUDIT): 0  Housing: High Risk (05/11/2023)   Housing Stability Vital Sign    Unable to Pay for Housing in the Last Year: Yes    Number of Times Moved in the Last Year: 0    Homeless in the Last Year: No  Utilities: Not At Risk (03/25/2023)   [facility] Utilities    Threatened with loss of utilities: No  Health Literacy: Adequate Health Literacy (03/25/2023)   B1300 Health Literacy    Frequency of need for help with medical instructions: Never    Review of Systems  Psychiatric/Behavioral:  Positive for depression. Negative for suicidal ideas. The patient is nervous/anxious.   All other systems reviewed and are negative.       Objective    BP 122/85   Pulse 80   Ht 5' 5 (1.651 m)   Wt (!) 340 lb 3.2 oz (154.3 kg)   LMP 04/06/2024   SpO2 98%   BMI 56.61 kg/m   Physical Exam Vitals and nursing note reviewed.  Constitutional:      General:  She is not in acute distress.    Appearance: She is obese.  Cardiovascular:     Rate and Rhythm: Normal rate and regular rhythm.  Pulmonary:     Effort: Pulmonary effort is normal.     Breath sounds: Normal breath sounds.  Abdominal:     Palpations: Abdomen is soft.     Tenderness: There is no abdominal tenderness.  Neurological:     General: No focal deficit present.     Mental Status: She is alert and oriented to person, place, and time.  Psychiatric:        Mood and Affect: Affect normal. Mood is anxious and depressed.        Behavior: Behavior normal.         Assessment & Plan:   1. Reactive depression (Primary) Patient encouraged to restart cymbalta . Clonazepam  refilled for prn use only  2. Moderate persistent asthma without complication Appears stable. Flonase  and xyzal  refilled.   3. Gastroesophageal reflux disease, unspecified whether esophagitis present Pantoprazole  refilled. Reviewed dietary and activity options.   4. Class 3 severe obesity due to excess calories with serious comorbidity and body mass index (BMI) of 50.0 to 59.9 in adult [facility]) Phentermine  prescribed.   5. S/P laparoscopic sleeve gastrectomyNov 2018     Return in about 4 weeks (around 05/16/2024) for follow up.   Tanda Raguel SQUIBB, MD  "

## 2024-06-02 ENCOUNTER — Ambulatory Visit: Payer: Self-pay | Admitting: Family Medicine
